# Patient Record
Sex: Male | Born: 1940 | Race: White | Hispanic: No | Smoking: Never smoker
Health system: Southern US, Community
[De-identification: ages and names within clinical notes are randomized; demographics above are authoritative.]

## PROBLEM LIST (undated history)

## (undated) DIAGNOSIS — F32A Depression, unspecified: Secondary | ICD-10-CM

## (undated) DIAGNOSIS — R42 Dizziness and giddiness: Secondary | ICD-10-CM

## (undated) DIAGNOSIS — N183 Chronic kidney disease, stage 3 unspecified: Secondary | ICD-10-CM

## (undated) DIAGNOSIS — G4730 Sleep apnea, unspecified: Secondary | ICD-10-CM

## (undated) DIAGNOSIS — H9190 Unspecified hearing loss, unspecified ear: Secondary | ICD-10-CM

## (undated) DIAGNOSIS — F329 Major depressive disorder, single episode, unspecified: Secondary | ICD-10-CM

## (undated) DIAGNOSIS — M109 Gout, unspecified: Secondary | ICD-10-CM

## (undated) DIAGNOSIS — I10 Essential (primary) hypertension: Secondary | ICD-10-CM

## (undated) DIAGNOSIS — F0390 Unspecified dementia without behavioral disturbance: Secondary | ICD-10-CM

## (undated) HISTORY — PX: COLONOSCOPY: SHX174

---

## 2013-06-03 ENCOUNTER — Inpatient Hospital Stay: Payer: Self-pay | Admitting: Internal Medicine

## 2013-06-03 LAB — CBC
HCT: 42.3 % (ref 40.0–52.0)
HGB: 14.5 g/dL (ref 13.0–18.0)
MCH: 29.4 pg (ref 26.0–34.0)
MCHC: 34.3 g/dL (ref 32.0–36.0)
MCV: 86 fL (ref 80–100)
Platelet: 178 10*3/uL (ref 150–440)
RBC: 4.94 10*6/uL (ref 4.40–5.90)

## 2013-06-03 LAB — COMPREHENSIVE METABOLIC PANEL
Albumin: 3.5 g/dL (ref 3.4–5.0)
Alkaline Phosphatase: 86 U/L
Anion Gap: 6 — ABNORMAL LOW (ref 7–16)
Bilirubin,Total: 1.2 mg/dL — ABNORMAL HIGH (ref 0.2–1.0)
Chloride: 109 mmol/L — ABNORMAL HIGH (ref 98–107)
Creatinine: 1.78 mg/dL — ABNORMAL HIGH (ref 0.60–1.30)
EGFR (African American): 43 — ABNORMAL LOW
Glucose: 159 mg/dL — ABNORMAL HIGH (ref 65–99)
Osmolality: 283 (ref 275–301)
Potassium: 3.6 mmol/L (ref 3.5–5.1)
Sodium: 139 mmol/L (ref 136–145)
Total Protein: 7.3 g/dL (ref 6.4–8.2)

## 2013-06-03 LAB — TROPONIN I
Troponin-I: 0.12 ng/mL — ABNORMAL HIGH
Troponin-I: 0.1 ng/mL — ABNORMAL HIGH

## 2013-06-03 LAB — CK TOTAL AND CKMB (NOT AT ARMC)
CK, Total: 78 U/L (ref 35–232)
CK, Total: 71 U/L (ref 35–232)
CK, Total: 62 U/L (ref 35–232)
CK-MB: 0.7 ng/mL (ref 0.5–3.6)
CK-MB: 0.8 ng/mL (ref 0.5–3.6)

## 2013-06-04 LAB — BASIC METABOLIC PANEL
Anion Gap: 5 — ABNORMAL LOW (ref 7–16)
Chloride: 110 mmol/L — ABNORMAL HIGH (ref 98–107)
Creatinine: 1.42 mg/dL — ABNORMAL HIGH (ref 0.60–1.30)
EGFR (African American): 57 — ABNORMAL LOW
Osmolality: 285 (ref 275–301)
Sodium: 141 mmol/L (ref 136–145)

## 2013-06-04 LAB — LIPID PANEL
Cholesterol: 134 mg/dL (ref 0–200)
Ldl Cholesterol, Calc: 80 mg/dL (ref 0–100)
VLDL Cholesterol, Calc: 29 mg/dL (ref 5–40)

## 2013-06-05 LAB — BASIC METABOLIC PANEL
BUN: 20 mg/dL — ABNORMAL HIGH (ref 7–18)
Calcium, Total: 8.9 mg/dL (ref 8.5–10.1)
Chloride: 106 mmol/L (ref 98–107)
Co2: 28 mmol/L (ref 21–32)
EGFR (African American): >60
EGFR (Non-African Amer.): 59 — ABNORMAL LOW
Glucose: 109 mg/dL — ABNORMAL HIGH (ref 65–99)
Osmolality: 281 (ref 275–301)
Sodium: 139 mmol/L (ref 136–145)

## 2013-06-05 LAB — HEMOGLOBIN: HGB: 14.4 g/dL (ref 13.0–18.0)

## 2014-10-12 NOTE — H&P (Signed)
PATIENT NAME:  Stanley Nielsen, Stanley Nielsen MR#:  [id number] DATE OF BIRTH:  01-23-1941  DATE OF ADMISSION:  06/03/2013  PRIMARY CARE PHYSICIAN: [facility]   CHIEF COMPLAINT: Syncope.   HISTORY OF PRESENT ILLNESS: This is a 74 year old male with a history of diabetes, who presents with the above complaint. 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 He picked up a heavy load, walked about 10 steps and all of a sudden felt very nauseous, lightheaded. Sat down the steps, and apparently he passed out. EMS had been called and when EMS arrived, he came to. There is no report of his blood sugars at that time. He woke up this morning actually feeling diaphoretic and had some nausea this morning as well. Three days ago, he says that he has acute left-sided sternal chest pain. It came and went over 24 hours, lasting about 20 to 30 minutes. No other associated symptoms. He has not reported chest pain since then.   REVIEW OF SYSTEMS:   CONSTITUTIONAL: No fever. Positive fatigue, weakness. No weight loss, weight gain.  EYES: No blurred or double vision, glaucoma or cataracts. EARS, NOSE, THROAT: No ear pain, hearing loss, seasonal allergies, postnasal drip. RESPIRATORY: No cough, wheezing, hemoptysis, dyspnea, asthma, painful respirations.  CARDIOVASCULAR: Chest pain 3 days ago, as mentioned above. No palpitations or orthopnea. Positive syncope today. No arrhythmia, dyspnea on exertion. GASTROINTESTINAL: Positive nausea and vomiting. No diarrhea, abdominal pain, melena, or ulcers.  GENITOURINARY: No dysuria or hematuria. ENDOCRINE: No polyuria or polydipsia. HEMATOLOGIC AND LYMPHATIC: No anemia or easy bruising. SKIN: No rash or lesions.  MUSCULOSKELETAL: No pain in the shoulders or knees.  NEUROLOGIC: No history of CVA, TIA, or seizures.  PSYCHIATRIC: No history of anxiety or depression.    PAST MEDICAL HISTORY:  1.  Hypertension. 2.  Diabetes.   MEDICATIONS:  1.  Metformin 500 b.i.d.   2.  Metoprolol 25 b.i.d.  3.  Lisinopril 10 mg daily.  4.  HCTZ 50 mg daily.   ALLERGIES: PENICILLIN CAUSE HIVES.   SOCIAL HISTORY: No tobacco, alcohol, or drug use.   FAMILY HISTORY: Positive for cancer and atrial fibrillation. Mom is still living at age 74.   PAST SURGICAL HISTORY: None.   PHYSICAL EXAMINATION: VITAL SIGNS: Temperature is 97.4, pulse 65, respirations 18, blood pressure 131/59, 96% on room air.  GENERAL: The patient is alert, oriented, not in acute distress. HEENT: Head is atraumatic. Pupils are round, reactive. Sclerae anicteric. Mucous membranes are moist. Oropharynx is clear.  NECK: Very short. Hard to feel any enlarged thyroid or lymphadenopathy.  CARDIOVASCULAR: Regular rate and rhythm. No murmurs, gallops, or rubs. Hard to appreciate PMI due to body habitus.  ABDOMEN: Obese. Bowel sounds are positive. Nontender, nondistended. Hard to appreciate organomegaly due to body habitus. No ecchymosis.  BACK: No CVA or vertebral tenderness.  LUNGS: Clear to auscultation without crackles, rales, rhonchi, or wheezing. Symmetrical rise and fall of the chest. Normal symmetrical expansion.  SKIN: Within normal limits. Well-hydrated. No diaphoresis.  NEUROLOGIC: Cranial nerves II through XII are intact. There are no focal deficits. Motor strength is 4 out of 4 bilateral upper and lower extremities.  MUSCULOSKELETAL: No pathology to digits or nails. Extremities move x 4. No tenderness or effusion. Range of motion is adequate.   LABORATORY, DIAGNOSTIC AND RADIOLOGICAL DATA: Troponin 0.11. CPK-MB is 0.7, CK 78. Total protein 7.3, albumin 3.5, bilirubin 1.2, alkaline phosphatase 86, AST 29, ALT 33. Sodium 139, potassium 3.6, chloride 109,  bicarbonate 24, BUN 18, creatinine 1.78. Glucose is 159. White blood cells 6.9, hemoglobin 14.5, hematocrit 43, platelets 178. Chest x-ray showed no acute cardiopulmonary disease. EKG: Sinus bradycardia. There are some nonspecific T changes.    ASSESSMENT AND PLAN: This is a 74 year old male who presented with syncope and found to have an elevated troponin.  1.  Syncope. This could be secondary to a non-ST elevation myocardial infarction versus a vasovagal reaction from carrying a heavy load versus possible hypoglycemia, as the patient does report some blood sugars that have been high and low recently. The patient will be admitted to telemetry. We will continue to cycle his cardiac enzymes. Have ordered an echocardiogram. Will also check orthostatic vital signs and monitor him closely.  2.  Elevated troponin with possible non-ST elevation myocardial infarction. The patient had chest pain 3 days ago. He is a diabetic. He had a syncopal episode today. Concerned about the possibility of non-ST elevation myocardial infarction. His first troponin is 0.11. We will continue to cycle these. I have ordered full-dose Lovenox. Side effects, alternatives, and risks were reviewed with the patient, who does want to start Lovenox full-dose. Will continue aspirin, beta blocker. Check fasting lipids. Cardiology consultation has been ordered as well.  3.  Diabetes. Will continue ADA diet and sliding scale insulin. Hold metformin, basically due to his renal insufficiency as well as possible need for cardiac catheterization if troponins continue to climb.  4.  Acute kidney injury. I am not sure what his baseline is. It is 1.78. I will continue lisinopril, hold the HCTZ, continue metoprolol, and monitor closely his creatinine and provide some very gentle hydration.  5.  Hypertension. Will continue lisinopril, metoprolol. Hold HCTZ for now due to acute kidney injury.  6.  The patient is a full code status.   TIME SPENT: 45 minutes.   ____________________________ Janyth Contes. Juliene Pina, MD spm:jcm D: 06/03/2013 15:21:54 ET T: 06/03/2013 16:25:34 ET JOB#: [id number]  cc: Robb Sibal P. Juliene Pina, MD, <Dictator> Dr. Azucena Kuba, [facility] Internal Medicine Travious Vanover P Niranjan Rufener MD ELECTRONICALLY SIGNED  06/04/2013 14:57

## 2014-10-12 NOTE — Consult Note (Signed)
PATIENT NAME:  Stanley Nielsen, Stanley Nielsen MR#:  [id number] DATE OF BIRTH:  [birth year]  DATE OF CONSULTATION:  06/04/2013  CARDIOLOGY CONSULTATION  PRIMARY CARE PHYSICIAN:  Dr. Azucena Kuba, [facility] CONSULTING PHYSICIAN:  Marcina Millard, MD  CHIEF COMPLAINT: "I passed out."  REASON FOR CONSULTATION: Consultation requested for evaluation of syncope and elevated troponin.   HISTORY OF PRESENT ILLNESS: The patient is a 74 year old gentleman referred for evaluation of syncope. The patient reports he had 2 previous syncopal episodes. The last episode occurred after cardiac catheterization was performed 18 months ago at [facility]. The patient reports that he became nauseous, lightheaded and passed out.  Cardiac catheterization apparently revealed normal coronary anatomy, normal left ventricular function. The patient reports that he was in his usual state of health until yesterday when he was unloading a truck, became nauseous, lightheaded, sat down the steps and lost consciousness briefly. EMS was called. He was brought to [facility] Emergency Room where EKG revealed normal sinus rhythm with nonspecific ST abnormalities laterally. Admission labs were notable for borderline elevated troponin of 0.11, follow-up troponin was 0.12. The patient has been asymptomatic following admission. EKG revealed sinus rhythm without brady- or tachyarrhythmias.   PAST MEDICAL HISTORY: 1.  Hypertension.  2.  Diabetes. 3.  Apparent vasovagal syncope.   MEDICATIONS: Metoprolol 25 mg b.i.d., lisinopril 10 mg daily, HCTZ 50 mg daily, metformin 500 mg b.i.d.   SOCIAL HISTORY: The patient denies tobacco or EtOH abuse. The patient currently lives alone.   FAMILY HISTORY: Noncontributory.  REVIEW OF SYSTEMS:  CONSTITUTIONAL: No fever or chills.  EYES: No blurry vision.  EARS: No hearing loss.  RESPIRATORY: No shortness of breath.  CARDIOVASCULAR: The patient has had one brief episode of chest pain earlier in the week. Currently  denies chest pain.  GASTROINTESTINAL: The patient had nausea prior to syncopal episode, denies constipation, diarrhea.  GENITOURINARY: No dysuria or hematuria.  ENDOCRINE: No polyuria or polydipsia.  MUSCULOSKELETAL: No arthralgias or myalgias.  NEUROLOGICAL: No focal muscle weakness or numbness.  PSYCHOLOGICAL: No depression or anxiety.   PHYSICAL EXAMINATION: VITAL SIGNS: Blood pressure 134/75, pulse 64, respirations 19, temperature 97.9, pulse oximetry 97%.  HEENT: Pupils equal and reactive to light and accommodation.  NECK: Supple without thyromegaly.  LUNGS: Clear.  CARDIOVASCULAR: Normal JVP. Normal PMI. Regular rate and rhythm. Normal S1, S2. No appreciable gallop, murmur or rub.  ABDOMEN: Soft and nontender. Pulses were intact bilaterally.  MUSCULOSKELETAL: Normal muscle tone.  NEUROLOGIC: The patient is alert and oriented x 3. Motor and sensory are both grossly intact.   IMPRESSION: A 74 year old gentleman with apparent normal coronary anatomy by recent cardiac catheterization with history of vasovagal syncope who presents with syncope which sounds vasovagal in nature. The patient has borderline elevated troponin without peak or trough in the absence of chest pain or ECG changes. It is unlikely the patient has acute coronary syndrome based on recent history of normal cardiac catheterization. The patient has had a prior history of vasovagal syncope.   RECOMMENDATIONS: 1.  Continue present medications.  2.  Defer full dose anticoagulation.  3.  Review 2-D echocardiogram.  4.  In light of history of normal coronary anatomy by recent cardiac catheterization, would defer further noninvasive or invasive evaluation at this time. 5.  Ambulate patient today. If patient does well, may consider discharge later today or in a.m.   ____________________________ Marcina Millard, MD ap:ce D: 06/04/2013 09:38:41 ET T: 06/04/2013 13:48:19 ET JOB#: [id number]  cc: Marcina Millard, MD,  <Dictator> Lyn Hollingshead  Zhania Shaheen MD ELECTRONICALLY SIGNED 06/09/2013 8:52

## 2014-10-12 NOTE — Discharge Summary (Signed)
PATIENT NAME:  Stanley Nielsen, Stanley Nielsen MR#:  [id number] DATE OF BIRTH:  24-Sep-1940  DATE OF ADMISSION:  06/03/2013 DATE OF DISCHARGE:  06/05/2013   ADMITTING DIAGNOSIS: Syncope.   DISCHARGE DIAGNOSES:   1. Syncope, likely vasovagal; however, cannot rule out due to dehydration, as well as bradycardia.  2.  Bradycardia, likely due to beta blocker.  3.  Dehydration, resolved with intravenous fluid administration.  4.  Recent chest pain.  5.  Minimally elevated troponin. No acute coronary syndrome, according to cardiologist.  6.  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  7.  Acute renal failure, resolved on intravenous fluids. 8.  History of hypertension, diabetes mellitus with hemoglobin A1c of 6.9.   DISCHARGE CONDITION: Stable.   DISCHARGE MEDICATIONS:    1.  Aspirin 81 mg p.o. daily.  2.  Metoprolol 25 mg 1/2 tablet twice daily. This is a new dose.  3.  Lisinopril 10 mg p.o. twice daily. This is a new dose.  4.  Metformin 500 mg p.o. twice daily.  5.  The patient was advised not to take hydrochlorothiazide unless recommended by primary care physician.   HOME OXYGEN: None.   DIET: Two-gram salt, low-fat, low-cholesterol, carbohydrate-controlled diet, regular consistency.   ACTIVITY LIMITATIONS: As tolerated.     FOLLOWUP APPOINTMENTS:  [facility], [place] Ms. Reed in 1 day after discharge.    CONSULTANTS: Cardiology, Dr. Darrold Junker.   RADIOLOGIC STUDIES: Chest x-ray, portable single view, 06/03/2013, showed no active disease. Echocardiogram, done 06/04/2013, revealed a left ventricular ejection fraction by visual estimation of 40% to 45%. Mildly decreased global left ventricular systolic function,  segment apical septal segment, apical lateral segment and apex were abnormal. Mild left ventricular hypertrophy, mitral valve regurgitation, mild  tricuspid regurgitation were noted.   The patient is a 74 year old Caucasian male with past medical history significant for history of hypertension and diabetes, who presented to the hospital with complaints of syncopal episode. Apparently, the patient suddenly became very nauseous, lightheaded and passed out. He was also somewhat diaphoretic in the morning of admission. Three days ago prior to coming to the hospital, he was having problems with chest pains, lasting approximately 20 to 30 minutes. On arrival to the Emergency Room.   The patient's vital signs, temperature was 97.4, pulse was 65, respiratory rate was 18, blood pressure 151/59, saturation was 96% on room air.  Physical exam was unremarkable.   The patient's EKG showed sinus brady at rate of 57 beats per minute, normal axis, nonspecific ST-T changes. Consider lateral ischemia. No previous EKG was available. The patient's chest x-ray was unremarkable.   The patient's lab data, BMP showed elevation of creatinine to 1.78, glucose 159, otherwise BMP was unremarkable. The patient's liver enzymes revealed a total bilirubin of 1.2, otherwise unremarkable. The patient's troponin was slightly elevated at 0.11; otherwise, BMP was normal. CBC was within normal limits.   The patient was admitted to the hospital for further evaluation. His cardiac enzymes were cycled and they showed no peak. Second troponin was 0.12, and the third one was 0.1. Cardiology consultation was obtained because his elevated troponin, as well as his syncopal episode. Cardiologist, however, did not feel the patient needs to have any further evaluation, since the patient reported significant workup including cardiac catheterization, as well as echocardiogram approximately 1-1/2 years ago. Echocardiogram was performed, and it was found to be slightly abnormal. We  were planning Myoview stress test for the patient; however, the patient initially refused. Later on, he felt that you  could have those studies done as an outpatient in the end. For this reason, we are discharging this patient from the hospital with [facility]  Cardiologist also felt that the patient's syncope could have been vasovagal; however, since the patient was bradycardic, there was concern that bradycardia, as well as some dehydration re-presenting itself with elevated creatinine level were to blame as well. The patient with rehydrated, and his kidney function normalized by the day of discharge, 06/05/2013. It was felt that the patient's dehydration could have been related to his diabetes, exacerbated by hydrochlorothiazide. His kidney function  was also abnormal because of dehydration, as well as ACE inhibitor and metformin, as well as hydrochlorothiazide.  For this reason, hydrochlorothiazide was placed on hold as is standard. The patient is to return back to his primary physician and make decisions about reinstitution of this medication if needed. Meanwhile, his beta blocker was cut down due to bradycardia, and his blood pressure medication lisinopril was advanced when his kidney function normalized to a twice daily dose.   In regards to diabetes mellitus. The patient's hemoglobin A1c was found to be 6.9. It was felt that his diabetes was relatively satisfactorily controlled. The patient is to continue metformin, since his kidney function normalized.   In regards to his mild cardiomyopathy with ejection fraction of 40% to 45% with wall motion abnormalities, it was felt that the patient would benefit from further cardiologic evaluation, especially if these changes are new since his prior studies approximately 1-1/2 years ago.   The patient is being discharged in stable condition with the above-mentioned medications and followup.   His vital signs on day of discharge, temperature was 97.6, pulse was 65 to 75, respiration rate was 17 to 18, blood pressure ranging high at around 150s to 160s  systolic. O2 sats were 96% on room air at rest.   TIME SPENT: 40 minutes on this patient.   ____________________________ Katharina Caperima Debe Anfinson, MD rv:dmm D: 06/05/2013 20:03:00 ET T: 06/05/2013 21:22:11 ET JOB#: [id number]  cc: Katharina Caperima Dorleen Kissel, MD, <Dictator> Ms. Renato Gailseed, [facility] Taggert Bozzi Winona Legato[place] MD ELECTRONICALLY SIGNED 06/17/2013 19:11

## 2015-02-14 ENCOUNTER — Emergency Department
Admission: EM | Admit: 2015-02-14 | Discharge: 2015-02-14 | Disposition: A | Discharge status: Home / Self Care | Payer: Non-veteran care | Attending: Emergency Medicine | Admitting: Emergency Medicine

## 2015-02-14 ENCOUNTER — Other Ambulatory Visit: Payer: Self-pay

## 2015-02-14 DIAGNOSIS — E860 Dehydration: Secondary | ICD-10-CM | POA: Insufficient documentation

## 2015-02-14 DIAGNOSIS — Z880 Allergy status to penicillin: Secondary | ICD-10-CM | POA: Insufficient documentation

## 2015-02-14 DIAGNOSIS — R55 Syncope and collapse: Secondary | ICD-10-CM | POA: Insufficient documentation

## 2015-02-14 LAB — BASIC METABOLIC PANEL
Anion gap: 10 (ref 5–15)
BUN: 26 mg/dL — AB (ref 6–20)
CALCIUM: 9.2 mg/dL (ref 8.9–10.3)
CO2: 22 mmol/L (ref 22–32)
CREATININE: 1.67 mg/dL — AB (ref 0.61–1.24)
Chloride: 110 mmol/L (ref 101–111)
GFR calc non Af Amer: 39 mL/min — ABNORMAL LOW (ref >60)
GFR, EST AFRICAN AMERICAN: 45 mL/min — AB (ref >60)
Glucose, Bld: 181 mg/dL — ABNORMAL HIGH (ref 65–99)
Potassium: 3.2 mmol/L — ABNORMAL LOW (ref 3.5–5.1)
SODIUM: 142 mmol/L (ref 135–145)

## 2015-02-14 LAB — CBC
HCT: 44.4 % (ref 40.0–52.0)
Hemoglobin: 15.3 g/dL (ref 13.0–18.0)
MCH: 29.8 pg (ref 26.0–34.0)
MCHC: 34.4 g/dL (ref 32.0–36.0)
MCV: 86.5 fL (ref 80.0–100.0)
PLATELETS: 189 10*3/uL (ref 150–440)
RBC: 5.14 MIL/uL (ref 4.40–5.90)
RDW: 13.3 % (ref 11.5–14.5)
WBC: 7.3 10*3/uL (ref 3.8–10.6)

## 2015-02-14 MED ORDER — SODIUM CHLORIDE 0.9 % IV BOLUS (SEPSIS)
1000.0000 mL | Freq: Once | INTRAVENOUS | Status: AC
  Administered 2015-02-14: 1000 mL via INTRAVENOUS

## 2015-02-14 NOTE — ED Provider Notes (Signed)
[facility] Emergency Department Provider Note  ____________________________________________  Time seen: 12:20 PM  I have reviewed the triage vital signs and the nursing notes.   HISTORY  Chief Complaint Loss of Consciousness    HPI Stanley Nielsen is a 74 y.o. male who presents to the ED after a syncopal episode this morning. Patient and family at the bedside note that they were at a funeral this morning and had been standing still outside in the sun for approximately 45 minutes when he felt lightheaded and then passed out. He denies any preceding symptoms such as chest pain shortness of breath headache vision change back pain abdominal pain. No vomiting diarrhea. No recent illness. He notes that he had not eaten or drank anything this morning prior to the funeral. He currently feels better and is asymptomatic.  He has had one episode of this in the past again when he had not eaten or drank anything for her entire day and then had gone to the [place] for a clinic appointment.     No past medical history on file. Denies There are no active problems to display for this patient.   No past surgical history on file.  No current outpatient prescriptions on file. None Allergies Penicillins  No family history on file.  Social History Social History  Substance Use Topics  . Smoking status: Not on file  . Smokeless tobacco: Not on file  . Alcohol Use: Not on file   no tobacco alcohol or drug use  Review of Systems  Constitutional: No fever or chills. No weight changes Eyes:No blurry vision or double vision.  ENT: No sore throat. Cardiovascular: No chest pain. Respiratory: No dyspnea or cough. Gastrointestinal: Negative for abdominal pain, vomiting and diarrhea.  No BRBPR or melena. Genitourinary: Negative for dysuria, urinary retention, bloody urine, or difficulty urinating. Musculoskeletal: Negative for back pain. No joint swelling or pain. Skin:  Negative for rash. Neurological: Negative for headaches, focal weakness or numbness. Psychiatric:No anxiety or depression.   Endocrine:No hot/cold intolerance, changes in energy, or sleep difficulty.  10-point ROS otherwise negative.  ____________________________________________   PHYSICAL EXAM:  VITAL SIGNS: ED Triage Vitals  Enc Vitals Group     BP 02/14/15 1200 123/81 mmHg     Pulse Rate 02/14/15 1213 69     Resp 02/14/15 1200 17     Temp 02/14/15 1213 98.3 F (36.8 C)     Temp Source 02/14/15 1213 Oral     SpO2 02/14/15 1149 96 %     Weight 02/14/15 1213 210 lb (95.255 kg)     Height 02/14/15 1213  (1.727 m)     Head Cir --      Peak Flow --      Pain Score --      Pain Loc --      Pain Edu? --      Excl. in GC? --      Constitutional: Alert and oriented. Well appearing and in no distress. Eyes: No scleral icterus. No conjunctival pallor. PERRL. EOMI ENT   Head: Normocephalic and atraumatic.   Nose: No congestion/rhinnorhea. No septal hematoma   Mouth/Throat: MMM, no pharyngeal erythema. No peritonsillar mass. No uvula shift.   Neck: No stridor. No SubQ emphysema. No meningismus. Hematological/Lymphatic/Immunilogical: No cervical lymphadenopathy. Cardiovascular: RRR. Normal and symmetric distal pulses are present in all extremities. No murmurs, rubs, or gallops. Respiratory: Normal respiratory effort without tachypnea nor retractions. Breath sounds are clear and equal bilaterally. No  wheezes/rales/rhonchi. Gastrointestinal: Soft and nontender. No distention. There is no CVA tenderness.  No rebound, rigidity, or guarding. Genitourinary: deferred Musculoskeletal: Nontender with normal range of motion in all extremities. No joint effusions.  No lower extremity tenderness.  No edema. Neurologic:   Normal speech and language.  CN 2-10 normal. Motor grossly intact. No pronator drift.  Normal gait. No gross focal neurologic deficits are appreciated.   Skin:  Skin is warm, dry and intact. No rash noted.  No petechiae, purpura, or bullae. Psychiatric: Mood and affect are normal. Speech and behavior are normal. Patient exhibits appropriate insight and judgment.  ____________________________________________    LABS (pertinent positives/negatives) (all labs ordered are listed, but only abnormal results are displayed) Labs Reviewed  BASIC METABOLIC PANEL - Abnormal; Notable for the following:    Potassium 3.2 (*)    Glucose, Bld 181 (*)    BUN 26 (*)    Creatinine, Ser 1.67 (*)    GFR calc non Af Amer 39 (*)    GFR calc Af Amer 45 (*)    All other components within normal limits  CBC   ____________________________________________   EKG  Interpreted by me Normal sinus rhythm rate of 77, normal axis, normal intervals, normal QRS and ST segments. There are T wave inversions of 1 and aVL which are unchanged compared to prior EKG on 06/03/2013.  ____________________________________________    RADIOLOGY    ____________________________________________   PROCEDURES  ____________________________________________   INITIAL IMPRESSION / ASSESSMENT AND PLAN / ED COURSE  Pertinent labs & imaging results that were available during my care of the patient were reviewed by me and considered in my medical decision making (see chart for details).  Patient presents with syncope most likely due to vasovagal episode and dehydration related to poor oral intake today. He is very well-appearing, comfortable no acute distress at this time and back to baseline. I'll give him IV fluids while waiting for labs and plan to discharge home and less there any severe findings.  ----------------------------------------- 1:49 PM on 02/14/2015 -----------------------------------------  Labs overall unremarkable. Creatinine slightly elevated which is not unusual for this patient looking at prior records. This is consistent with dehydration which should  be improved now with IV fluids. We'll discharge him home and have him follow up with primary care  ____________________________________________   FINAL CLINICAL IMPRESSION(S) / ED DIAGNOSES  Final diagnoses:  Syncope, unspecified syncope type  Mild dehydration      Sharman Cheek, MD 02/14/15 1350

## 2015-02-14 NOTE — Discharge Instructions (Signed)
Dehydration Dehydration means your body does not have as much fluid as it needs. Your kidneys, brain, and heart will not work properly without the right amount of fluids and salt. Older adults are more likely to become dehydrated than younger adults. This is because:   Their bodies do not hold water as well.  Their bodies do not respond to temperature changes as well.  They do not get thirsty as easily or as quickly. HOME CARE  Ask your doctor how to replace body fluid losses (rehydrate).  Drink enough fluids to keep your pee (urine) clear or pale yellow.  Drink small amounts of fluids often if you feel sick to your stomach (nauseous) or throw up (vomit).  Eat like you normally do.  Avoid:  Foods or drinks high in sugar.  Bubbly (carbonated) drinks.  Juice.  Very hot or cold fluids.  Drinks with caffeine.  Fatty, greasy foods.  Alcohol.  Tobacco.  Eating too much.  Gelatin desserts.  Wash your hands to avoid spreading germs (bacteria, viruses).  Only take medicine as told by your doctor.  Keep all doctor visits as told. GET HELP IF:  You have belly (abdominal) pain that gets worse or stays in one spot (localizes).  You have a rash, stiff neck, or bad headache.  You get easily annoyed, sleepy, or are hard to wake up.  You feel weak, dizzy, or very thirsty.  You have a fever. GET HELP RIGHT AWAY IF:   You cannot drink fluid without throwing up.  You get worse even with treatment.  You throw up often.  You have watery poop (diarrhea) often.  Your vomit has blood in it or looks greenish.  Your poop (stool) has blood in it or looks black and tarry.  You have not peed in 6 to 8 hours or have only peed a small amount of very dark pee.  You pass out (faint). MAKE SURE YOU:   Understand these instructions.  Will watch your condition.  Will get help right away if you are not doing well or get worse. Document Released: 05/28/2011 Document Revised:  06/13/2013 Document Reviewed: 02/13/2013 [facility] Patient Information 2015 [place], [place]. This information is not intended to replace advice given to you by your health care provider. Make sure you discuss any questions you have with your health care provider.  Syncope Syncope is a medical term for fainting or passing out. This means you lose consciousness and drop to the ground. People are generally unconscious for less than 5 minutes. You may have some muscle twitches for up to 15 seconds before waking up and returning to normal. Syncope occurs more often in older adults, but it can happen to anyone. While most causes of syncope are not dangerous, syncope can be a sign of a serious medical problem. It is important to seek medical care.  CAUSES  Syncope is caused by a sudden drop in blood flow to the brain. The specific cause is often not determined. Factors that can bring on syncope include:  Taking medicines that lower blood pressure.  Sudden changes in posture, such as standing up quickly.  Taking more medicine than prescribed.  Standing in one place for too long.  Seizure disorders.  Dehydration and excessive exposure to heat.  Low blood sugar (hypoglycemia).  Straining to have a bowel movement.  Heart disease, irregular heartbeat, or other circulatory problems.  Fear, emotional distress, seeing blood, or severe pain. SYMPTOMS  Right before fainting, you may:  Feel dizzy  or light-headed.  Feel nauseous.  See all white or all black in your field of vision.  Have cold, clammy skin. DIAGNOSIS  Your health care provider will ask about your symptoms, perform a physical exam, and perform an electrocardiogram (ECG) to record the electrical activity of your heart. Your health care provider may also perform other heart or blood tests to determine the cause of your syncope which may include:  Transthoracic echocardiogram (TTE). During echocardiography, sound waves are used to  evaluate how blood flows through your heart.  Transesophageal echocardiogram (TEE).  Cardiac monitoring. This allows your health care provider to monitor your heart rate and rhythm in real time.  Holter monitor. This is a portable device that records your heartbeat and can help diagnose heart arrhythmias. It allows your health care provider to track your heart activity for several days, if needed.  Stress tests by exercise or by giving medicine that makes the heart beat faster. TREATMENT  In most cases, no treatment is needed. Depending on the cause of your syncope, your health care provider may recommend changing or stopping some of your medicines. HOME CARE INSTRUCTIONS  Have someone stay with you until you feel stable.  Do not drive, use machinery, or play sports until your health care provider says it is okay.  Keep all follow-up appointments as directed by your health care provider.  Lie down right away if you start feeling like you might faint. Breathe deeply and steadily. Wait until all the symptoms have passed.  Drink enough fluids to keep your urine clear or pale yellow.  If you are taking blood pressure or heart medicine, get up slowly and take several minutes to sit and then stand. This can reduce dizziness. SEEK IMMEDIATE MEDICAL CARE IF:   You have a severe headache.  You have unusual pain in the chest, abdomen, or back.  You are bleeding from your mouth or rectum, or you have black or tarry stool.  You have an irregular or very fast heartbeat.  You have pain with breathing.  You have repeated fainting or seizure-like jerking during an episode.  You faint when sitting or lying down.  You have confusion.  You have trouble walking.  You have severe weakness.  You have vision problems. If you fainted, call your local emergency services (911 in [place] Do not drive yourself to the hospital.  MAKE SURE YOU:  Understand these instructions.  Will watch your  condition.  Will get help right away if you are not doing well or get worse. Document Released: 06/08/2005 Document Revised: 06/13/2013 Document Reviewed: 08/07/2011 [facility] Patient Information 2015 [place], [place]. This information is not intended to replace advice given to you by your health care provider. Make sure you discuss any questions you have with your health care provider.

## 2015-02-14 NOTE — ED Notes (Addendum)
Pt was at a funeral this AM, had syncopal episode., no loss of bowel  Or bladder.. Pt sts had 'just not  feeling well in, prt  cbg =187

## 2015-04-23 ENCOUNTER — Encounter: Payer: Self-pay | Admitting: *Deleted

## 2015-04-30 ENCOUNTER — Ambulatory Visit: Payer: Non-veteran care | Admitting: Anesthesiology

## 2015-04-30 ENCOUNTER — Ambulatory Visit
Admission: RE | Admit: 2015-04-30 | Discharge: 2015-04-30 | Disposition: A | Discharge status: Home / Self Care | Payer: Non-veteran care | Source: Ambulatory Visit | Attending: Ophthalmology | Admitting: Ophthalmology

## 2015-04-30 ENCOUNTER — Encounter
Admission: RE | Disposition: A | Discharge status: Home / Self Care | Payer: Self-pay | Source: Ambulatory Visit | Attending: Ophthalmology

## 2015-04-30 ENCOUNTER — Encounter: Payer: Self-pay | Admitting: Anesthesiology

## 2015-04-30 DIAGNOSIS — I10 Essential (primary) hypertension: Secondary | ICD-10-CM | POA: Insufficient documentation

## 2015-04-30 DIAGNOSIS — M109 Gout, unspecified: Secondary | ICD-10-CM | POA: Diagnosis not present

## 2015-04-30 DIAGNOSIS — Z6833 Body mass index (BMI) 33.0-33.9, adult: Secondary | ICD-10-CM | POA: Diagnosis not present

## 2015-04-30 DIAGNOSIS — E119 Type 2 diabetes mellitus without complications: Secondary | ICD-10-CM | POA: Insufficient documentation

## 2015-04-30 DIAGNOSIS — E669 Obesity, unspecified: Secondary | ICD-10-CM | POA: Insufficient documentation

## 2015-04-30 DIAGNOSIS — Z880 Allergy status to penicillin: Secondary | ICD-10-CM | POA: Insufficient documentation

## 2015-04-30 DIAGNOSIS — F329 Major depressive disorder, single episode, unspecified: Secondary | ICD-10-CM | POA: Insufficient documentation

## 2015-04-30 DIAGNOSIS — H9190 Unspecified hearing loss, unspecified ear: Secondary | ICD-10-CM | POA: Diagnosis not present

## 2015-04-30 DIAGNOSIS — G4730 Sleep apnea, unspecified: Secondary | ICD-10-CM | POA: Diagnosis not present

## 2015-04-30 DIAGNOSIS — H2512 Age-related nuclear cataract, left eye: Secondary | ICD-10-CM | POA: Insufficient documentation

## 2015-04-30 HISTORY — DX: Depression, unspecified: F32.A

## 2015-04-30 HISTORY — DX: Dizziness and giddiness: R42

## 2015-04-30 HISTORY — DX: Gout, unspecified: M10.9

## 2015-04-30 HISTORY — DX: Unspecified hearing loss, unspecified ear: H91.90

## 2015-04-30 HISTORY — DX: Sleep apnea, unspecified: G47.30

## 2015-04-30 HISTORY — PX: CATARACT EXTRACTION W/PHACO: SHX586

## 2015-04-30 HISTORY — DX: Major depressive disorder, single episode, unspecified: F32.9

## 2015-04-30 HISTORY — DX: Essential (primary) hypertension: I10

## 2015-04-30 LAB — GLUCOSE, CAPILLARY: Glucose-Capillary: 176 mg/dL — ABNORMAL HIGH (ref 65–99)

## 2015-04-30 SURGERY — PHACOEMULSIFICATION, CATARACT, WITH IOL INSERTION
Anesthesia: Monitor Anesthesia Care | Site: Eye | Laterality: Left | Wound class: Clean

## 2015-04-30 MED ORDER — NA CHONDROIT SULF-NA HYALURON 40-17 MG/ML IO SOLN
INTRAOCULAR | Status: DC | PRN
  Administered 2015-04-30: 1 mL via INTRAOCULAR

## 2015-04-30 MED ORDER — EPINEPHRINE HCL 1 MG/ML IJ SOLN
INTRAMUSCULAR | Status: AC
  Filled 2015-04-30: qty 1

## 2015-04-30 MED ORDER — ARMC OPHTHALMIC DILATING GEL
1.0000 | OPHTHALMIC | Status: DC | PRN
Start: 2015-04-30 — End: 2015-04-30
  Administered 2015-04-30: 1 via OPHTHALMIC

## 2015-04-30 MED ORDER — TETRACAINE HCL 0.5 % OP SOLN
1.0000 [drp] | OPHTHALMIC | Status: DC | PRN
  Administered 2015-04-30: 1 [drp] via OPHTHALMIC

## 2015-04-30 MED ORDER — MOXIFLOXACIN HCL 0.5 % OP SOLN: 1.0000 [drp] | OPHTHALMIC | Status: DC | PRN

## 2015-04-30 MED ORDER — NA CHONDROIT SULF-NA HYALURON 40-17 MG/ML IO SOLN
INTRAOCULAR | Status: AC
  Filled 2015-04-30: qty 1

## 2015-04-30 MED ORDER — SODIUM CHLORIDE 0.9 % IV SOLN
INTRAVENOUS | Status: DC
Start: 2015-04-30 — End: 2015-04-30
  Administered 2015-04-30 (×2): via INTRAVENOUS

## 2015-04-30 MED ORDER — POVIDONE-IODINE 5 % OP SOLN
1.0000 "application " | OPHTHALMIC | Status: AC | PRN
  Administered 2015-04-30: 1 via OPHTHALMIC

## 2015-04-30 MED ORDER — MOXIFLOXACIN HCL 0.5 % OP SOLN
OPHTHALMIC | Status: DC | PRN
  Administered 2015-04-30: 1 [drp] via OPHTHALMIC

## 2015-04-30 MED ORDER — MOXIFLOXACIN HCL 0.5 % OP SOLN
OPHTHALMIC | Status: AC
  Filled 2015-04-30: qty 3

## 2015-04-30 MED ORDER — FENTANYL CITRATE (PF) 100 MCG/2ML IJ SOLN
INTRAMUSCULAR | Status: DC | PRN
  Administered 2015-04-30: 50 ug via INTRAVENOUS

## 2015-04-30 MED ORDER — ARMC OPHTHALMIC DILATING GEL
OPHTHALMIC | Status: AC
  Administered 2015-04-30: 1 via OPHTHALMIC
  Filled 2015-04-30: qty 0.25

## 2015-04-30 MED ORDER — CEFUROXIME OPHTHALMIC INJECTION 1 MG/0.1 ML
INJECTION | OPHTHALMIC | Status: AC
  Filled 2015-04-30: qty 0.1

## 2015-04-30 MED ORDER — POVIDONE-IODINE 5 % OP SOLN
OPHTHALMIC | Status: AC
  Administered 2015-04-30: 1 via OPHTHALMIC
  Filled 2015-04-30: qty 30

## 2015-04-30 MED ORDER — TETRACAINE HCL 0.5 % OP SOLN
OPHTHALMIC | Status: AC
  Administered 2015-04-30: 1 [drp] via OPHTHALMIC
  Filled 2015-04-30: qty 2

## 2015-04-30 MED ORDER — EPINEPHRINE HCL 1 MG/ML IJ SOLN
INTRAOCULAR | Status: DC | PRN
  Administered 2015-04-30: 1 mL via OPHTHALMIC

## 2015-04-30 MED ORDER — MIDAZOLAM HCL 2 MG/2ML IJ SOLN
INTRAMUSCULAR | Status: DC | PRN
  Administered 2015-04-30: 1 mg via INTRAVENOUS

## 2015-04-30 MED ORDER — CARBACHOL 0.01 % IO SOLN
INTRAOCULAR | Status: DC | PRN
  Administered 2015-04-30: 0.5 mL via INTRAOCULAR

## 2015-04-30 SURGICAL SUPPLY — 22 items
CANNULA ANT/CHMB 27GA (MISCELLANEOUS) ×3 IMPLANT
CUP MEDICINE 2OZ PLAST GRAD ST (MISCELLANEOUS) ×3 IMPLANT
GLOVE BIO SURGEON STRL SZ8 (GLOVE) ×3 IMPLANT
GLOVE BIOGEL M 6.5 STRL (GLOVE) ×3 IMPLANT
GLOVE SURG LX 8.0 MICRO (GLOVE) ×2
GLOVE SURG LX STRL 8.0 MICRO (GLOVE) ×1 IMPLANT
GOWN STRL REUS W/ TWL LRG LVL3 (GOWN DISPOSABLE) ×2 IMPLANT
GOWN STRL REUS W/TWL LRG LVL3 (GOWN DISPOSABLE) ×4
LENS IOL TECNIS 17.0 (Intraocular Lens) ×3 IMPLANT
LENS IOL TECNIS MONO 1P 17.0 (Intraocular Lens) ×1 IMPLANT
PACK CATARACT (MISCELLANEOUS) ×3 IMPLANT
PACK CATARACT BRASINGTON LX (MISCELLANEOUS) ×3 IMPLANT
PACK EYE AFTER SURG (MISCELLANEOUS) ×3 IMPLANT
SOL BSS BAG (MISCELLANEOUS) ×3
SOL PREP PVP 2OZ (MISCELLANEOUS) ×3
SOLUTION BSS BAG (MISCELLANEOUS) ×1 IMPLANT
SOLUTION PREP PVP 2OZ (MISCELLANEOUS) ×1 IMPLANT
SYR 3ML LL SCALE MARK (SYRINGE) ×3 IMPLANT
SYR 5ML LL (SYRINGE) ×3 IMPLANT
SYR TB 1ML 27GX1/2 LL (SYRINGE) ×3 IMPLANT
WATER STERILE IRR 1000ML POUR (IV SOLUTION) ×3 IMPLANT
WIPE NON LINTING 3.25X3.25 (MISCELLANEOUS) ×3 IMPLANT

## 2015-04-30 NOTE — H&P (Signed)
  All labs reviewed. Abnormal studies sent to patients PCP when indicated.  Previous H&P reviewed, patient examined, there are NO CHANGES.  Stanley Royse LOUIS11/8/20168:09 AM

## 2015-04-30 NOTE — Op Note (Signed)
PREOPERATIVE DIAGNOSIS:  Nuclear sclerotic cataract of the left eye.   POSTOPERATIVE DIAGNOSIS:  Nuclear sclerotic cataract of the left eye.   OPERATIVE PROCEDURE: Procedure(s): CATARACT EXTRACTION PHACO AND INTRAOCULAR LENS PLACEMENT (IOC)   SURGEON:  Galen ManilaWilliam Joplin Canty, MD.   ANESTHESIA:  Anesthesiologist: Berdine AddisonMathai Thomas, MD CRNA: Henrietta HooverKimberly Pope, CRNA  1.      Managed anesthesia care. 2.      Topical tetracaine drops followed by 2% Xylocaine jelly applied in the preoperative holding area.   COMPLICATIONS:  None.   TECHNIQUE:   Stop and chop   DESCRIPTION OF PROCEDURE:  The patient was examined and consented in the preoperative holding area where the aforementioned topical anesthesia was applied to the left eye and then brought back to the Operating Room where the left eye was prepped and draped in the usual sterile ophthalmic fashion and a lid speculum was placed. A paracentesis was created with the side port blade and the anterior chamber was filled with viscoelastic. A near clear corneal incision was performed with the steel keratome. A continuous curvilinear capsulorrhexis was performed with a cystotome followed by the capsulorrhexis forceps. Hydrodissection and hydrodelineation were carried out with BSS on a blunt cannula. The lens was removed in a stop and chop  technique and the remaining cortical material was removed with the irrigation-aspiration handpiece. The capsular bag was inflated with viscoelastic and the Technis ZCB00 lens was placed in the capsular bag without complication. The remaining viscoelastic was removed from the eye with the irrigation-aspiration handpiece. The wounds were hydrated. The anterior chamber was flushed with Miostat and the eye was inflated to physiologic pressure. 0.2 mL of Vigamox diluted three/one with BSS was placed in the anterior chamber. The wounds were found to be water tight. The eye was dressed with Vigamox. The patient was given protective glasses  to wear throughout the day and a shield with which to sleep tonight. The patient was also given drops with which to begin a drop regimen today and will follow-up with me in one day.  Implant Name Type Inv. Item Serial No. Manufacturer Lot No. LRB No. Used  LENS IMPL INTRAOC ZCB00 17.0 - [id number] Intraocular Lens LENS IMPL INTRAOC ZCB00 17.0 [id number] AMO   Left 1    Procedure(s) with comments: CATARACT EXTRACTION PHACO AND INTRAOCULAR LENS PLACEMENT (IOC) (Left) - US 00:47 AP% 23.5 CDE 11.16 fluid pack lot # [id number]  Electronically signed: Josilyn Shippee LOUIS 04/30/2015 8:38 AM

## 2015-04-30 NOTE — Anesthesia Postprocedure Evaluation (Signed)
  Anesthesia Post-op Note  Patient: Stanley Nielsen  Procedure(s) Performed: Procedure(s) with comments: CATARACT EXTRACTION PHACO AND INTRAOCULAR LENS PLACEMENT (IOC) (Left) - US 00:47 AP% 23.5 CDE 11.16 fluid pack lot # [id number]  Anesthesia type:MAC  Patient location:sds  Post pain: Pain level controlled  Post assessment: Post-op Vital signs reviewed, Patient's Cardiovascular Status Stable, Respiratory Function Stable, Patent Airway and No signs of Nausea or vomiting  Post vital signs: Reviewed and stable  Last Vitals:  Filed Vitals:   04/30/15 0736  BP: 173/101  Pulse: 83  Temp: 36.7 C  Resp: 16    Level of consciousness: awake, alert  and patient cooperative  Complications: No apparent anesthesia complications

## 2015-04-30 NOTE — Anesthesia Procedure Notes (Signed)
Procedure Name: MAC Date/Time: 04/30/2015 8:10 AM Performed by: Henrietta HooverPOPE, Adreana Coull Pre-anesthesia Checklist: Patient identified, Emergency Drugs available, Suction available, Patient being monitored and Timeout performed Oxygen Delivery Method: Nasal cannula Intubation Type: IV induction

## 2015-04-30 NOTE — Transfer of Care (Signed)
Immediate Anesthesia Transfer of Care Note  Patient: Stanley Nielsen  Procedure(s) Performed: Procedure(s) with comments: CATARACT EXTRACTION PHACO AND INTRAOCULAR LENS PLACEMENT (IOC) (Left) - US 00:47 AP% 23.5 CDE 11.16 fluid pack lot # [id number] H  Patient Location: PACU  Anesthesia Type:MAC  Level of Consciousness: awake  Airway & Oxygen Therapy: Patient Spontanous Breathing  Post-op Assessment: Report given to RN  Post vital signs: Reviewed and stable  Last Vitals:  Filed Vitals:   04/30/15 0736  BP: 173/101  Pulse: 83  Temp: 36.7 C  Resp: 16    Complications: No apparent anesthesia complications

## 2015-04-30 NOTE — Anesthesia Preprocedure Evaluation (Addendum)
Anesthesia Evaluation  Patient identified by MRN, date of birth, ID band Patient awake    Reviewed: Allergy & Precautions, NPO status , Patient's Chart, lab work & pertinent test results, reviewed documented beta blocker date and time   Airway Mallampati: II  TM Distance: >3 FB     Dental  (+) Chipped, Poor Dentition, Missing, Dental Advisory Given   Pulmonary sleep apnea and Continuous Positive Airway Pressure Ventilation ,           Cardiovascular hypertension, Pt. on medications      Neuro/Psych PSYCHIATRIC DISORDERS Depression    GI/Hepatic   Endo/Other  diabetes, Type 2  Renal/GU      Musculoskeletal   Abdominal   Peds  Hematology   Anesthesia Other Findings obese  Reproductive/Obstetrics                            Anesthesia Physical Anesthesia Plan  ASA: II  Anesthesia Plan: MAC   Post-op Pain Management:    Induction:   Airway Management Planned:   Additional Equipment:   Intra-op Plan:   Post-operative Plan:   Informed Consent: I have reviewed the patients History and Physical, chart, labs and discussed the procedure including the risks, benefits and alternatives for the proposed anesthesia with the patient or authorized representative who has indicated his/her understanding and acceptance.     Plan Discussed with: CRNA  Anesthesia Plan Comments:         Anesthesia Quick Evaluation

## 2015-05-13 ENCOUNTER — Encounter: Payer: Self-pay | Admitting: Ophthalmology

## 2015-05-13 NOTE — Addendum Note (Signed)
Addendum  created 05/13/15 1929 by Berdine AddisonMathai Jocabed Cheese, MD   Modules edited: Anesthesia Attestations, Anesthesia Events, Narrator   Anesthesia Attestations:  Attestation Deleted   Narrator:  Narrator: Event Log Edited

## 2015-06-14 ENCOUNTER — Emergency Department
Admission: EM | Admit: 2015-06-14 | Discharge: 2015-06-14 | Disposition: A | Discharge status: Home / Self Care | Payer: Non-veteran care | Attending: Emergency Medicine | Admitting: Emergency Medicine

## 2015-06-14 ENCOUNTER — Encounter: Payer: Self-pay | Admitting: Emergency Medicine

## 2015-06-14 DIAGNOSIS — Z792 Long term (current) use of antibiotics: Secondary | ICD-10-CM | POA: Insufficient documentation

## 2015-06-14 DIAGNOSIS — E119 Type 2 diabetes mellitus without complications: Secondary | ICD-10-CM | POA: Diagnosis not present

## 2015-06-14 DIAGNOSIS — R55 Syncope and collapse: Secondary | ICD-10-CM | POA: Diagnosis present

## 2015-06-14 DIAGNOSIS — Z880 Allergy status to penicillin: Secondary | ICD-10-CM | POA: Diagnosis not present

## 2015-06-14 DIAGNOSIS — Z79899 Other long term (current) drug therapy: Secondary | ICD-10-CM | POA: Insufficient documentation

## 2015-06-14 DIAGNOSIS — E860 Dehydration: Secondary | ICD-10-CM | POA: Diagnosis not present

## 2015-06-14 DIAGNOSIS — I10 Essential (primary) hypertension: Secondary | ICD-10-CM | POA: Insufficient documentation

## 2015-06-14 DIAGNOSIS — R112 Nausea with vomiting, unspecified: Secondary | ICD-10-CM | POA: Insufficient documentation

## 2015-06-14 HISTORY — DX: Unspecified dementia, unspecified severity, without behavioral disturbance, psychotic disturbance, mood disturbance, and anxiety: F03.90

## 2015-06-14 LAB — URINALYSIS COMPLETE WITH MICROSCOPIC (ARMC ONLY)
BILIRUBIN URINE: NEGATIVE
Bacteria, UA: NONE SEEN
GLUCOSE, UA: NEGATIVE mg/dL
Hgb urine dipstick: NEGATIVE
Leukocytes, UA: NEGATIVE
NITRITE: NEGATIVE
PH: 6 (ref 5.0–8.0)
Protein, ur: 30 mg/dL — AB
Specific Gravity, Urine: 1.011 (ref 1.005–1.030)
Squamous Epithelial / LPF: NONE SEEN

## 2015-06-14 LAB — BASIC METABOLIC PANEL
ANION GAP: 5 (ref 5–15)
BUN: 15 mg/dL (ref 6–20)
CALCIUM: 9 mg/dL (ref 8.9–10.3)
CO2: 27 mmol/L (ref 22–32)
CREATININE: 1.36 mg/dL — AB (ref 0.61–1.24)
Chloride: 109 mmol/L (ref 101–111)
GFR calc Af Amer: 58 mL/min — ABNORMAL LOW (ref >60)
GFR, EST NON AFRICAN AMERICAN: 50 mL/min — AB (ref >60)
GLUCOSE: 188 mg/dL — AB (ref 65–99)
Potassium: 3.1 mmol/L — ABNORMAL LOW (ref 3.5–5.1)
Sodium: 141 mmol/L (ref 135–145)

## 2015-06-14 LAB — CBC
HCT: 42.6 % (ref 40.0–52.0)
Hemoglobin: 14.7 g/dL (ref 13.0–18.0)
MCH: 29.9 pg (ref 26.0–34.0)
MCHC: 34.4 g/dL (ref 32.0–36.0)
MCV: 86.7 fL (ref 80.0–100.0)
PLATELETS: 168 10*3/uL (ref 150–440)
RBC: 4.91 MIL/uL (ref 4.40–5.90)
RDW: 13.7 % (ref 11.5–14.5)
WBC: 8.4 10*3/uL (ref 3.8–10.6)

## 2015-06-14 LAB — TROPONIN I: Troponin I: 0.03 ng/mL (ref <0.031)

## 2015-06-14 MED ORDER — SODIUM CHLORIDE 0.9 % IV BOLUS (SEPSIS)
1000.0000 mL | Freq: Once | INTRAVENOUS | Status: AC
  Administered 2015-06-14: 1000 mL via INTRAVENOUS

## 2015-06-14 MED ORDER — ONDANSETRON 4 MG PO TBDP: 4.0000 mg | ORAL_TABLET | Freq: Three times a day (TID) | ORAL | Status: DC | PRN

## 2015-06-14 NOTE — Discharge Instructions (Signed)
Please drink plenty of fluids of the next 2-3 days, follow up with her primary care physician as soon as possible for recheck/reevaluation. [facility] any further syncopal episodes, or if he develop any chest pain, pressure, trouble breathing, or any other symptom personally concerning to yourself.   Syncope Syncope is a medical term for fainting or passing out. This means you lose consciousness and drop to the ground. People are generally unconscious for less than 5 minutes. You may have some muscle twitches for up to 15 seconds before waking up and returning to normal. Syncope occurs more often in older adults, but it can happen to anyone. While most causes of syncope are not dangerous, syncope can be a sign of a serious medical problem. It is important to seek medical care.  CAUSES  Syncope is caused by a sudden drop in blood flow to the brain. The specific cause is often not determined. Factors that can bring on syncope include:  Taking medicines that lower blood pressure.  Sudden changes in posture, such as standing up quickly.  Taking more medicine than prescribed.  Standing in one place for too long.  Seizure disorders.  Dehydration and excessive exposure to heat.  Low blood sugar (hypoglycemia).  Straining to have a bowel movement.  Heart disease, irregular heartbeat, or other circulatory problems.  Fear, emotional distress, seeing blood, or severe pain. SYMPTOMS  Right before fainting, you may:  Feel dizzy or light-headed.  Feel nauseous.  See all white or all black in your field of vision.  Have cold, clammy skin. DIAGNOSIS  Your health care provider will ask about your symptoms, perform a physical exam, and perform an electrocardiogram (ECG) to record the electrical activity of your heart. Your health care provider may also perform other heart or blood tests to determine the cause of your syncope which may include:  Transthoracic  echocardiogram (TTE). During echocardiography, sound waves are used to evaluate how blood flows through your heart.  Transesophageal echocardiogram (TEE).  Cardiac monitoring. This allows your health care provider to monitor your heart rate and rhythm in real time.  Holter monitor. This is a portable device that records your heartbeat and can help diagnose heart arrhythmias. It allows your health care provider to track your heart activity for several days, if needed.  Stress tests by exercise or by giving medicine that makes the heart beat faster. TREATMENT  In most cases, no treatment is needed. Depending on the cause of your syncope, your health care provider may recommend changing or stopping some of your medicines. HOME CARE INSTRUCTIONS  Have someone stay with you until you feel stable.  Do not drive, use machinery, or play sports until your health care provider says it is okay.  Keep all follow-up appointments as directed by your health care provider.  Lie down right away if you start feeling like you might faint. Breathe deeply and steadily. Wait until all the symptoms have passed.  Drink enough fluids to keep your urine clear or pale yellow.  If you are taking blood pressure or heart medicine, get up slowly and take several minutes to sit and then stand. This can reduce dizziness. SEEK IMMEDIATE MEDICAL CARE IF:   You have a severe headache.  You have unusual pain in the chest, abdomen, or back.  You are bleeding from your mouth or rectum, or you have black or tarry stool.  You have an irregular or very fast heartbeat.  You have pain with  breathing.  You have repeated fainting or seizure-like jerking during an episode.  You faint when sitting or lying down.  You have confusion.  You have trouble walking.  You have severe weakness.  You have vision problems. If you fainted, call your local emergency services (911 in [place] Do not drive yourself to the hospital.      This information is not intended to replace advice given to you by your health care provider. Make sure you discuss any questions you have with your health care provider.   Document Released: 06/08/2005 Document Revised: 10/23/2014 Document Reviewed: 08/07/2011 Elsevier Interactive Patient Education [employer]

## 2015-06-14 NOTE — ED Provider Notes (Signed)
[facility] Emergency Department Provider Note  Time seen: 4:54 PM  I have reviewed the triage vital signs and the nursing notes.   HISTORY  Chief Complaint Loss of Consciousness    HPI Stanley Nielsen is a 74 y.o. male with a past medical history of hypertension, diabetes, dementia, who presents the emergency department with a syncopal episode. According to the patient he was recently started on Aricept for dementia this week. He states since starting the medication he has felt very nauseated, and had 3 episodes of vomiting yesterday. Admits she has not been eating or drinking much due to the nausea and vomiting. States he is feeling somewhat better today so he tried to run around his last minute shopping. Ran out of gas and had to walk approximately 1 mile. While walking he became diaphoretic, states he felt lightheaded and sat down but had a brief syncopal episode. Patient denies any chest pain now or at any time. Currently he feels quite well, denies any symptoms at this time. Denies any shortness of breath. Denies any lightheadedness. Patient states over the past 2 years he has had 2 or 3 syncopal episodes.     Past Medical History  Diagnosis Date  . Sleep apnea   . Hypertension   . Depression   . Diabetes mellitus without complication (HCC)   . Gout   . HOH (hard of hearing)   . Vertigo   . Dementia     There are no active problems to display for this patient.   Past Surgical History  Procedure Laterality Date  . Colonoscopy    . Cataract extraction w/phaco Left 04/30/2015    Procedure: CATARACT EXTRACTION PHACO AND INTRAOCULAR LENS PLACEMENT (IOC);  Surgeon: Galen Manila, MD;  Location: [facility] ORS;  Service: Ophthalmology;  Laterality: Left;  [place] 00:47 AP% 23.5 CDE 11.16 fluid pack lot # [id number]    Current Outpatient Rx  Name  Route  Sig  Dispense  Refill  . amLODipine (NORVASC) 10 MG tablet   Oral   Take 10 mg by mouth daily.          . clindamycin (CLEOCIN) 150 MG capsule   Oral   Take by mouth 4 (four) times daily.         Marland Kitchen HYDROcodone-acetaminophen (NORCO/VICODIN) 5-325 MG tablet   Oral   Take 1 tablet by mouth every 4 (four) hours as needed for moderate pain.         Marland Kitchen losartan (COZAAR) 100 MG tablet   Oral   Take 100 mg by mouth daily.         . meclizine (ANTIVERT) 25 MG tablet   Oral   Take 25 mg by mouth 3 (three) times daily.           Allergies Penicillins  No family history on file.  Social History Social History  Substance Use Topics  . Smoking status: Never Smoker   . Smokeless tobacco: Not on file  . Alcohol Use: Yes     Comment: OCC    Review of Systems Constitutional: Negative for fever. Positive for syncopal episode Cardiovascular: Negative for chest pain. Respiratory: Negative for shortness of breath. Gastrointestinal: Negative for abdominal pain. Positive for nausea and vomiting Genitourinary: Negative for dysuria Neurological: Negative for headache 10-point ROS otherwise negative.  ____________________________________________   PHYSICAL EXAM:  VITAL SIGNS: ED Triage Vitals  Enc Vitals Group     BP 06/14/15 1527 138/77 mmHg     Pulse Rate  06/14/15 1527 66     Resp 06/14/15 1527 16     Temp 06/14/15 1527 97.5 F (36.4 C)     Temp Source 06/14/15 1527 Oral     SpO2 06/14/15 1513 95 %     Weight 06/14/15 1527 220 lb (99.791 kg)     Height 06/14/15 1527 5\' 8"  (1.727 m)     Head Cir --      Peak Flow --      Pain Score --      Pain Loc --      Pain Edu? --      Excl. in GC? --     Constitutional: Alert and oriented. Well appearing and in no distress. Eyes: Normal exam ENT   Head: Normocephalic and atraumatic.   Mouth/Throat: Mucous membranes are moist. Cardiovascular: Normal rate, regular rhythm. No murmur Respiratory: Normal respiratory effort without tachypnea nor retractions. Breath sounds are clear and equal bilaterally. No  wheezes/rales/rhonchi. Gastrointestinal: Soft and nontender. No distention.  Musculoskeletal: Nontender with normal range of motion in all extremities. No lower extremity tenderness Neurologic:  Normal speech and language. No gross focal neurologic deficits Skin:  Skin is warm, dry and intact.  Psychiatric: Mood and affect are normal. Speech and behavior are normal.   ____________________________________________    EKG  EKG reviewed and interpreted by myself shows sinus rhythm at 70 bpm, narrow QRS, normal axis, normal intervals, nonspecific ST changes. No ST elevations.  ____________________________________________    INITIAL IMPRESSION / ASSESSMENT AND PLAN / ED COURSE  Pertinent labs & imaging results that were available during my care of the patient were reviewed by me and considered in my medical decision making (see chart for details).  Patient presents the emergency department after a syncopal episode. Patient admits she has not been eating very much or drinking very much over the past several days due to nausea which she was told by his neurologist is normal after starting Aricept.  Labs within normal limits. Troponin is negative. Patient has received 2 L of normal saline. We will repeat a troponin at this time, if the repeat troponin is normal plan to discharge the patient home with primary care follow-up. The patient is agreeable to this plan.  Repeat troponin negative, patient remains well, symptom free in the emergency department. We'll discharge the patient with primary care follow-up, patient agreeable.  ____________________________________________   FINAL CLINICAL IMPRESSION(S) / ED DIAGNOSES  Syncope   Minna AntisKevin Shamel Germond, MD 06/14/15 [phone]

## 2015-06-14 NOTE — ED Notes (Signed)
Pt here via [facility] after syncopal episode x2; hx of the same after getting "too hot" earlier this year, EMS reports pt ran out of gas and walked 1 mile, was pale and diaphoretic upon arrival. EMS reports pt was confused upon arrival, CBG 190. BP 160 systolic lying and 106 lying. Pt reports he vomited x1 yesterday, reports recently dx with dementia and started aricept 4 days ago. Pt denies any pain, reports "I feel like I got hit by a truck, I just feel yucky". Pt alert and oriented upon arrival to ED.

## 2015-10-01 ENCOUNTER — Encounter: Payer: Self-pay | Admitting: Emergency Medicine

## 2015-10-01 ENCOUNTER — Emergency Department
Admission: EM | Admit: 2015-10-01 | Discharge: 2015-10-01 | Disposition: A | Discharge status: Home / Self Care | Payer: Non-veteran care | Attending: Emergency Medicine | Admitting: Emergency Medicine

## 2015-10-01 DIAGNOSIS — F329 Major depressive disorder, single episode, unspecified: Secondary | ICD-10-CM | POA: Insufficient documentation

## 2015-10-01 DIAGNOSIS — R55 Syncope and collapse: Secondary | ICD-10-CM

## 2015-10-01 DIAGNOSIS — E119 Type 2 diabetes mellitus without complications: Secondary | ICD-10-CM | POA: Insufficient documentation

## 2015-10-01 DIAGNOSIS — I10 Essential (primary) hypertension: Secondary | ICD-10-CM | POA: Insufficient documentation

## 2015-10-01 LAB — COMPREHENSIVE METABOLIC PANEL
ALK PHOS: 67 U/L (ref 38–126)
ALT: 18 U/L (ref 17–63)
AST: 25 U/L (ref 15–41)
Albumin: 3.6 g/dL (ref 3.5–5.0)
Anion gap: 5 (ref 5–15)
BILIRUBIN TOTAL: 0.9 mg/dL (ref 0.3–1.2)
BUN: 13 mg/dL (ref 6–20)
CHLORIDE: 111 mmol/L (ref 101–111)
CO2: 22 mmol/L (ref 22–32)
CREATININE: 1.13 mg/dL (ref 0.61–1.24)
Calcium: 8.3 mg/dL — ABNORMAL LOW (ref 8.9–10.3)
GFR calc Af Amer: >60 mL/min (ref >60)
Glucose, Bld: 179 mg/dL — ABNORMAL HIGH (ref 65–99)
Potassium: 2.9 mmol/L — CL (ref 3.5–5.1)
Sodium: 138 mmol/L (ref 135–145)
Total Protein: 7.2 g/dL (ref 6.5–8.1)

## 2015-10-01 LAB — CBC
HEMATOCRIT: 41.1 % (ref 40.0–52.0)
HEMOGLOBIN: 14.3 g/dL (ref 13.0–18.0)
MCH: 29.4 pg (ref 26.0–34.0)
MCHC: 34.7 g/dL (ref 32.0–36.0)
MCV: 84.5 fL (ref 80.0–100.0)
Platelets: 208 10*3/uL (ref 150–440)
RBC: 4.86 MIL/uL (ref 4.40–5.90)
RDW: 13.9 % (ref 11.5–14.5)
WBC: 7.6 10*3/uL (ref 3.8–10.6)

## 2015-10-01 LAB — TROPONIN I: Troponin I: <0.03 ng/mL (ref <0.031)

## 2015-10-01 MED ORDER — SODIUM CHLORIDE 0.9 % IV BOLUS (SEPSIS)
1000.0000 mL | Freq: Once | INTRAVENOUS | Status: AC
  Administered 2015-10-01: 1000 mL via INTRAVENOUS

## 2015-10-01 NOTE — ED Notes (Addendum)
Pt presents to ED via EMS from [place] c/o emesis and syncopal episode while at breakfast this morning. [facility] called EMS. Pt arrives A&Ox4, not complaining of nausea at this time. NS bolus started PTA by EMS. CBG 151 per EMS.

## 2015-10-01 NOTE — ED Provider Notes (Signed)
[facility] Emergency Department Provider Note  ____________________________________________    I have reviewed the triage vital signs and the nursing notes.   HISTORY  Chief Complaint Loss of Consciousness and Emesis    HPI Stanley Nielsen is a 75 y.o. male who presents with complaints of a syncopal episode while at breakfast this morning. Patient reports he has had 3 syncopal episodes over the last 6 months. He denies chest pain. No palpitations. Prior to the episodes he does develop a cold sweat on his neck. Today he was sitting and had a couple of bites of egg at [employer] and developed a cold sweat on his neck. In the ED reports he feels well he has no complaints     Past Medical History  Diagnosis Date  . Sleep apnea   . Hypertension   . Depression   . Diabetes mellitus without complication (HCC)   . Gout   . HOH (hard of hearing)   . Vertigo   . Dementia     There are no active problems to display for this patient.   Past Surgical History  Procedure Laterality Date  . Colonoscopy    . Cataract extraction w/phaco Left 04/30/2015    Procedure: CATARACT EXTRACTION PHACO AND INTRAOCULAR LENS PLACEMENT (IOC);  Surgeon: Galen Manila, MD;  Location: [facility] ORS;  Service: Ophthalmology;  Laterality: Left;  [place] 00:47 AP% 23.5 CDE 11.16 fluid pack lot # [id number]    Current Outpatient Rx  Name  Route  Sig  Dispense  Refill  . amLODipine (NORVASC) 10 MG tablet   Oral   Take 10 mg by mouth daily.         . clindamycin (CLEOCIN) 150 MG capsule   Oral   Take by mouth 4 (four) times daily.         Marland Kitchen HYDROcodone-acetaminophen (NORCO/VICODIN) 5-325 MG tablet   Oral   Take 1 tablet by mouth every 4 (four) hours as needed for moderate pain.         Marland Kitchen losartan (COZAAR) 100 MG tablet   Oral   Take 100 mg by mouth daily.         . meclizine (ANTIVERT) 25 MG tablet   Oral   Take 25 mg by mouth 3 (three) times daily.         .  ondansetron (ZOFRAN ODT) 4 MG disintegrating tablet   Oral   Take 1 tablet (4 mg total) by mouth every 8 (eight) hours as needed for nausea or vomiting.   20 tablet   0     Allergies Penicillins and Sulfa antibiotics  History reviewed. No pertinent family history.  Social History Social History  Substance Use Topics  . Smoking status: Never Smoker   . Smokeless tobacco: None  . Alcohol Use: Yes     Comment: OCC    Review of Systems  Constitutional: Negative for fever. Eyes: Negative for redness ENT: Negative for sore throat Cardiovascular: Negative for chest pain, no palpitations Respiratory: Negative for shortness of breath. Gastrointestinal: Negative for abdominal pain Genitourinary: Negative for dysuria. Musculoskeletal: Negative for back pain. Skin: Negative for rash. Neurological: Negative for focal weakness, no headache Psychiatric: no anxiety    ____________________________________________   PHYSICAL EXAM:  VITAL SIGNS: ED Triage Vitals  Enc Vitals Group     BP 10/01/15 0950 134/75 mmHg     Pulse Rate 10/01/15 0950 60     Resp 10/01/15 0950 20     Temp 10/01/15  0950 98.3 F (36.8 C)     Temp Source 10/01/15 0950 Oral     SpO2 10/01/15 0950 100 %     Weight 10/01/15 0950 211 lb 12.8 oz (96.072 kg)     Height 10/01/15 0950 5\' 7"  (1.702 m)     Head Cir --      Peak Flow --      Pain Score 10/01/15 1257 0     Pain Loc --      Pain Edu? --      Excl. in GC? --      Constitutional: Alert and oriented. Well appearing and in no distress. Pleasant and interactive Eyes: Conjunctivae are normal. No erythema or injection ENT   Head: Normocephalic and atraumatic.   Mouth/Throat: Mucous membranes are moist. Cardiovascular: Normal rate, regular rhythm. Normal and symmetric distal pulses are present in the upper extremities. Respiratory: Normal respiratory effort without tachypnea nor retractions.  Gastrointestinal: Soft and non-tender in all  quadrants. No distention. There is no CVA tenderness. Genitourinary: deferred Musculoskeletal: Nontender with normal range of motion in all extremities. No lower extremity tenderness nor edema. Neurologic:  Normal speech and language. No gross focal neurologic deficits are appreciated. Skin:  Skin is warm, dry and intact. No rash noted. Psychiatric: Mood and affect are normal. Patient exhibits appropriate insight and judgment.  ____________________________________________    LABS (pertinent positives/negatives)  Labs Reviewed  COMPREHENSIVE METABOLIC PANEL - Abnormal; Notable for the following:    Potassium 2.9 (*)    Glucose, Bld 179 (*)    Calcium 8.3 (*)    All other components within normal limits  CBC  TROPONIN I    ____________________________________________   EKG  ED ECG REPORT I, Jene EveryKINNER, Kasim Mccorkle, the attending physician, personally viewed and interpreted this ECG.  Date: 10/01/2015 EKG Time: 9:50 AM Rate: 57 Rhythm: normal sinus rhythm QRS Axis: normal Intervals: normal ST/T Wave abnormalities: normal     ____________________________________________    RADIOLOGY  None  ____________________________________________   PROCEDURES  Procedure(s) performed: none  Critical Care performed: none  ____________________________________________   INITIAL IMPRESSION / ASSESSMENT AND PLAN / ED COURSE  Pertinent labs & imaging results that were available during my care of the patient were reviewed by me and considered in my medical decision making (see chart for details).  Patient well-appearing and in no distress. He is asymptomatic in the emergency department. We will check labs, placed on a cardiac monitor, check EKG and reevaluate   Patient monitored in the emergency department for approximately 3 hours. No further episodes. He feels well. No chest pain or shortness of breath. No palpitations. Lab work is unremarkable. EKG is benign. I have asked patient  to follow-up with cardiology for further workup. He agrees this plan. Return precautions discussed ____________________________________________   FINAL CLINICAL IMPRESSION(S) / ED DIAGNOSES  Final diagnoses:  Vasovagal syncope          Jene Everyobert Alistar Mcenery, MD 10/01/15 1526

## 2015-10-01 NOTE — Discharge Instructions (Signed)

## 2017-03-26 ENCOUNTER — Emergency Department (HOSPITAL_COMMUNITY): Payer: Non-veteran care

## 2017-03-26 ENCOUNTER — Inpatient Hospital Stay (HOSPITAL_COMMUNITY)
Admission: EM | Admit: 2017-03-26 | Discharge: 2017-03-30 | DRG: 064 | Disposition: A | Discharge status: Home Health | Payer: Non-veteran care | Attending: Family Medicine | Admitting: Family Medicine

## 2017-03-26 ENCOUNTER — Encounter (HOSPITAL_COMMUNITY): Payer: Self-pay | Admitting: Emergency Medicine

## 2017-03-26 DIAGNOSIS — E1122 Type 2 diabetes mellitus with diabetic chronic kidney disease: Secondary | ICD-10-CM | POA: Diagnosis present

## 2017-03-26 DIAGNOSIS — N179 Acute kidney failure, unspecified: Secondary | ICD-10-CM | POA: Diagnosis present

## 2017-03-26 DIAGNOSIS — H9190 Unspecified hearing loss, unspecified ear: Secondary | ICD-10-CM | POA: Diagnosis present

## 2017-03-26 DIAGNOSIS — Z961 Presence of intraocular lens: Secondary | ICD-10-CM | POA: Diagnosis present

## 2017-03-26 DIAGNOSIS — N183 Chronic kidney disease, stage 3 unspecified: Secondary | ICD-10-CM

## 2017-03-26 DIAGNOSIS — I129 Hypertensive chronic kidney disease with stage 1 through stage 4 chronic kidney disease, or unspecified chronic kidney disease: Secondary | ICD-10-CM | POA: Diagnosis present

## 2017-03-26 DIAGNOSIS — Z9981 Dependence on supplemental oxygen: Secondary | ICD-10-CM | POA: Diagnosis not present

## 2017-03-26 DIAGNOSIS — I10 Essential (primary) hypertension: Secondary | ICD-10-CM | POA: Diagnosis not present

## 2017-03-26 DIAGNOSIS — F0390 Unspecified dementia without behavioral disturbance: Secondary | ICD-10-CM | POA: Diagnosis present

## 2017-03-26 DIAGNOSIS — E1151 Type 2 diabetes mellitus with diabetic peripheral angiopathy without gangrene: Secondary | ICD-10-CM | POA: Diagnosis present

## 2017-03-26 DIAGNOSIS — R29810 Facial weakness: Secondary | ICD-10-CM | POA: Diagnosis present

## 2017-03-26 DIAGNOSIS — R40241 Glasgow coma scale score 13-15, unspecified time: Secondary | ICD-10-CM | POA: Diagnosis present

## 2017-03-26 DIAGNOSIS — G4733 Obstructive sleep apnea (adult) (pediatric): Secondary | ICD-10-CM | POA: Diagnosis present

## 2017-03-26 DIAGNOSIS — I519 Heart disease, unspecified: Secondary | ICD-10-CM | POA: Diagnosis not present

## 2017-03-26 DIAGNOSIS — Z9842 Cataract extraction status, left eye: Secondary | ICD-10-CM | POA: Diagnosis not present

## 2017-03-26 DIAGNOSIS — G8194 Hemiplegia, unspecified affecting left nondominant side: Secondary | ICD-10-CM | POA: Diagnosis present

## 2017-03-26 DIAGNOSIS — K219 Gastro-esophageal reflux disease without esophagitis: Secondary | ICD-10-CM | POA: Diagnosis not present

## 2017-03-26 DIAGNOSIS — E876 Hypokalemia: Secondary | ICD-10-CM | POA: Diagnosis present

## 2017-03-26 DIAGNOSIS — F03918 Unspecified dementia, unspecified severity, with other behavioral disturbance: Secondary | ICD-10-CM | POA: Diagnosis present

## 2017-03-26 DIAGNOSIS — G4730 Sleep apnea, unspecified: Secondary | ICD-10-CM

## 2017-03-26 DIAGNOSIS — Z79899 Other long term (current) drug therapy: Secondary | ICD-10-CM

## 2017-03-26 DIAGNOSIS — I6381 Other cerebral infarction due to occlusion or stenosis of small artery: Secondary | ICD-10-CM | POA: Diagnosis present

## 2017-03-26 DIAGNOSIS — E785 Hyperlipidemia, unspecified: Secondary | ICD-10-CM | POA: Diagnosis present

## 2017-03-26 DIAGNOSIS — R29711 NIHSS score 11: Secondary | ICD-10-CM | POA: Diagnosis present

## 2017-03-26 DIAGNOSIS — I159 Secondary hypertension, unspecified: Secondary | ICD-10-CM

## 2017-03-26 DIAGNOSIS — M109 Gout, unspecified: Secondary | ICD-10-CM | POA: Diagnosis present

## 2017-03-26 DIAGNOSIS — G9341 Metabolic encephalopathy: Secondary | ICD-10-CM | POA: Diagnosis present

## 2017-03-26 DIAGNOSIS — I639 Cerebral infarction, unspecified: Secondary | ICD-10-CM | POA: Diagnosis present

## 2017-03-26 DIAGNOSIS — Z880 Allergy status to penicillin: Secondary | ICD-10-CM

## 2017-03-26 DIAGNOSIS — R531 Weakness: Secondary | ICD-10-CM | POA: Diagnosis present

## 2017-03-26 DIAGNOSIS — E1129 Type 2 diabetes mellitus with other diabetic kidney complication: Secondary | ICD-10-CM | POA: Diagnosis present

## 2017-03-26 DIAGNOSIS — F329 Major depressive disorder, single episode, unspecified: Secondary | ICD-10-CM | POA: Diagnosis present

## 2017-03-26 DIAGNOSIS — E1165 Type 2 diabetes mellitus with hyperglycemia: Secondary | ICD-10-CM | POA: Diagnosis present

## 2017-03-26 DIAGNOSIS — R260 Ataxic gait: Secondary | ICD-10-CM | POA: Diagnosis present

## 2017-03-26 DIAGNOSIS — R55 Syncope and collapse: Secondary | ICD-10-CM | POA: Diagnosis not present

## 2017-03-26 DIAGNOSIS — I5189 Other ill-defined heart diseases: Secondary | ICD-10-CM

## 2017-03-26 DIAGNOSIS — I361 Nonrheumatic tricuspid (valve) insufficiency: Secondary | ICD-10-CM | POA: Diagnosis not present

## 2017-03-26 DIAGNOSIS — E119 Type 2 diabetes mellitus without complications: Secondary | ICD-10-CM | POA: Diagnosis present

## 2017-03-26 DIAGNOSIS — Z23 Encounter for immunization: Secondary | ICD-10-CM

## 2017-03-26 HISTORY — DX: Chronic kidney disease, stage 3 (moderate): N18.3

## 2017-03-26 HISTORY — DX: Chronic kidney disease, stage 3 unspecified: N18.30

## 2017-03-26 LAB — CBC
HEMATOCRIT: 43.8 % (ref 39.0–52.0)
Hemoglobin: 15.5 g/dL (ref 13.0–17.0)
MCH: 30.2 pg (ref 26.0–34.0)
MCHC: 35.4 g/dL (ref 30.0–36.0)
MCV: 85.4 fL (ref 78.0–100.0)
PLATELETS: 201 10*3/uL (ref 150–400)
RBC: 5.13 MIL/uL (ref 4.22–5.81)
RDW: 13.1 % (ref 11.5–15.5)
WBC: 8.7 10*3/uL (ref 4.0–10.5)

## 2017-03-26 LAB — URINALYSIS, ROUTINE W REFLEX MICROSCOPIC
Bilirubin Urine: NEGATIVE
Glucose, UA: 50 mg/dL — AB
Hgb urine dipstick: NEGATIVE
Ketones, ur: 5 mg/dL — AB
Leukocytes, UA: NEGATIVE
Nitrite: NEGATIVE
PROTEIN: 100 mg/dL — AB
Specific Gravity, Urine: 1.04 — ABNORMAL HIGH (ref 1.005–1.030)
pH: 5 (ref 5.0–8.0)

## 2017-03-26 LAB — DIFFERENTIAL
BASOS ABS: 0.1 10*3/uL (ref 0.0–0.1)
BASOS PCT: 1 %
EOS ABS: 0.1 10*3/uL (ref 0.0–0.7)
Eosinophils Relative: 2 %
Lymphocytes Relative: 37 %
Lymphs Abs: 3.2 10*3/uL (ref 0.7–4.0)
Monocytes Absolute: 0.7 10*3/uL (ref 0.1–1.0)
Monocytes Relative: 8 %
NEUTROS ABS: 4.7 10*3/uL (ref 1.7–7.7)
NEUTROS PCT: 54 %

## 2017-03-26 LAB — I-STAT CHEM 8, ED
BUN: 15 mg/dL (ref 6–20)
CALCIUM ION: 1.11 mmol/L — AB (ref 1.15–1.40)
CREATININE: 1.4 mg/dL — AB (ref 0.61–1.24)
Chloride: 107 mmol/L (ref 101–111)
Glucose, Bld: 201 mg/dL — ABNORMAL HIGH (ref 65–99)
HEMATOCRIT: 43 % (ref 39.0–52.0)
Hemoglobin: 14.6 g/dL (ref 13.0–17.0)
Potassium: 3.2 mmol/L — ABNORMAL LOW (ref 3.5–5.1)
SODIUM: 143 mmol/L (ref 135–145)
TCO2: 23 mmol/L (ref 22–32)

## 2017-03-26 LAB — APTT: APTT: 30 s (ref 24–36)

## 2017-03-26 LAB — COMPREHENSIVE METABOLIC PANEL
ALBUMIN: 3.9 g/dL (ref 3.5–5.0)
ALT: 32 U/L (ref 17–63)
AST: 57 U/L — AB (ref 15–41)
Alkaline Phosphatase: 77 U/L (ref 38–126)
Anion gap: 9 (ref 5–15)
BUN: 15 mg/dL (ref 6–20)
CHLORIDE: 107 mmol/L (ref 101–111)
CO2: 22 mmol/L (ref 22–32)
CREATININE: 1.6 mg/dL — AB (ref 0.61–1.24)
Calcium: 9.2 mg/dL (ref 8.9–10.3)
GFR calc Af Amer: 47 mL/min — ABNORMAL LOW (ref >60)
GFR, EST NON AFRICAN AMERICAN: 41 mL/min — AB (ref >60)
Glucose, Bld: 198 mg/dL — ABNORMAL HIGH (ref 65–99)
POTASSIUM: 3.9 mmol/L (ref 3.5–5.1)
SODIUM: 138 mmol/L (ref 135–145)
Total Bilirubin: 2 mg/dL — ABNORMAL HIGH (ref 0.3–1.2)
Total Protein: 7.8 g/dL (ref 6.5–8.1)

## 2017-03-26 LAB — I-STAT TROPONIN, ED: TROPONIN I, POC: 0.01 ng/mL (ref 0.00–0.08)

## 2017-03-26 LAB — PROTIME-INR
INR: 1.06
PROTHROMBIN TIME: 13.7 s (ref 11.4–15.2)

## 2017-03-26 LAB — CBG MONITORING, ED: Glucose-Capillary: 186 mg/dL — ABNORMAL HIGH (ref 65–99)

## 2017-03-26 LAB — AMMONIA: Ammonia: 35 umol/L (ref 9–35)

## 2017-03-26 MED ORDER — ZOLPIDEM TARTRATE 5 MG PO TABS: 5.0000 mg | ORAL_TABLET | Freq: Every evening | ORAL | Status: DC | PRN

## 2017-03-26 MED ORDER — SODIUM CHLORIDE 0.9 % IV SOLN
INTRAVENOUS | Status: DC
  Administered 2017-03-27: via INTRAVENOUS

## 2017-03-26 MED ORDER — INSULIN ASPART 100 UNIT/ML ~~LOC~~ SOLN
0.0000 [IU] | Freq: Three times a day (TID) | SUBCUTANEOUS | Status: DC
  Administered 2017-03-27: 3 [IU] via SUBCUTANEOUS
  Administered 2017-03-27: 5 [IU] via SUBCUTANEOUS
  Administered 2017-03-27 – 2017-03-28 (×2): 2 [IU] via SUBCUTANEOUS
  Administered 2017-03-28: 1 [IU] via SUBCUTANEOUS
  Administered 2017-03-28 – 2017-03-29 (×2): 2 [IU] via SUBCUTANEOUS
  Administered 2017-03-29: 1 [IU] via SUBCUTANEOUS
  Administered 2017-03-29: 2 [IU] via SUBCUTANEOUS
  Administered 2017-03-30 (×2): 1 [IU] via SUBCUTANEOUS

## 2017-03-26 MED ORDER — STROKE: EARLY STAGES OF RECOVERY BOOK
Freq: Once | Status: AC
  Administered 2017-03-26: 23:00:00
  Filled 2017-03-26: qty 1

## 2017-03-26 MED ORDER — IOPAMIDOL (ISOVUE-370) INJECTION 76%
INTRAVENOUS | Status: AC
  Administered 2017-03-26: 50 mL
  Filled 2017-03-26: qty 50

## 2017-03-26 MED ORDER — ENOXAPARIN SODIUM 40 MG/0.4ML ~~LOC~~ SOLN
40.0000 mg | SUBCUTANEOUS | Status: DC
  Administered 2017-03-27 – 2017-03-29 (×4): 40 mg via SUBCUTANEOUS
  Filled 2017-03-26 (×4): qty 0.4

## 2017-03-26 MED ORDER — ATORVASTATIN CALCIUM 40 MG PO TABS
40.0000 mg | ORAL_TABLET | Freq: Every day | ORAL | Status: DC
  Administered 2017-03-27 – 2017-03-29 (×3): 40 mg via ORAL
  Filled 2017-03-26 (×3): qty 1

## 2017-03-26 MED ORDER — SENNOSIDES-DOCUSATE SODIUM 8.6-50 MG PO TABS: 1.0000 | ORAL_TABLET | Freq: Every evening | ORAL | Status: DC | PRN

## 2017-03-26 MED ORDER — ACETAMINOPHEN 160 MG/5ML PO SOLN: 650.0000 mg | ORAL | Status: DC | PRN

## 2017-03-26 MED ORDER — HYDRALAZINE HCL 20 MG/ML IJ SOLN: 5.0000 mg | INTRAMUSCULAR | Status: DC | PRN

## 2017-03-26 MED ORDER — ASPIRIN 300 MG RE SUPP: 300.0000 mg | Freq: Every day | RECTAL | Status: DC

## 2017-03-26 MED ORDER — ACETAMINOPHEN 650 MG RE SUPP: 650.0000 mg | RECTAL | Status: DC | PRN

## 2017-03-26 MED ORDER — ONDANSETRON HCL 4 MG/2ML IJ SOLN: 4.0000 mg | Freq: Three times a day (TID) | INTRAMUSCULAR | Status: DC | PRN

## 2017-03-26 MED ORDER — PANTOPRAZOLE SODIUM 40 MG PO TBEC
40.0000 mg | DELAYED_RELEASE_TABLET | Freq: Every day | ORAL | Status: DC
  Administered 2017-03-27 – 2017-03-30 (×4): 40 mg via ORAL
  Filled 2017-03-26 (×4): qty 1

## 2017-03-26 MED ORDER — ACETAMINOPHEN 325 MG PO TABS: 650.0000 mg | ORAL_TABLET | ORAL | Status: DC | PRN

## 2017-03-26 MED ORDER — GALANTAMINE HYDROBROMIDE 4 MG PO TABS
4.0000 mg | ORAL_TABLET | Freq: Two times a day (BID) | ORAL | Status: DC
  Administered 2017-03-27 – 2017-03-30 (×7): 4 mg via ORAL
  Filled 2017-03-26 (×9): qty 1

## 2017-03-26 MED ORDER — POTASSIUM CHLORIDE 20 MEQ/15ML (10%) PO SOLN: 20.0000 meq | Freq: Once | ORAL | Status: DC

## 2017-03-26 MED ORDER — ASPIRIN 325 MG PO TABS
325.0000 mg | ORAL_TABLET | Freq: Every day | ORAL | Status: DC
  Administered 2017-03-27 – 2017-03-30 (×4): 325 mg via ORAL
  Filled 2017-03-26 (×5): qty 1

## 2017-03-26 MED ORDER — INSULIN ASPART 100 UNIT/ML ~~LOC~~ SOLN: 0.0000 [IU] | Freq: Every day | SUBCUTANEOUS | Status: DC

## 2017-03-26 MED ORDER — SODIUM CHLORIDE 0.9 % IV BOLUS (SEPSIS)
1000.0000 mL | Freq: Once | INTRAVENOUS | Status: AC
  Administered 2017-03-26: 1000 mL via INTRAVENOUS

## 2017-03-26 NOTE — ED Notes (Signed)
This RN spoke with the pts daughter Cheskel Silverio who reports being the pts POA in [place], the daughter lives in [place] and requests to be update on the pts MRI results when available, the pt can be contacted via phone at [phone] or via email at [email or web address] primary RN aware

## 2017-03-26 NOTE — ED Notes (Signed)
PT FAILED SWALLOW SCREEN D/T COUGHING AFTER WATER. KEEP NPO.

## 2017-03-26 NOTE — ED Triage Notes (Signed)
Pt arrives from home with [facility] after falling going up steps and after sitting down per caregiver pt became unresponsive. On ems arrival pt was confused and had gcs of 13. Pt activated as code stroke andf met at bridge by stroke team and taken to CT on arrival.

## 2017-03-26 NOTE — H&P (Signed)
History and Physical    Stanley Nielsen ZOX:[id number] DOB: Mar 10, 1941 DOA: 03/26/2017  Referring MD/NP/PA:   PCP: Reid, [place], MD   Patient coming from:  The patient is coming from home.  At baseline, pt is independent for most of ADL.   Chief Complaint: fall, weakness in arms and left leg, AMS  HPI: Stanley Nielsen is a 76 y.o. male with medical history significant of hypertension, diet-controlled diabetes, GERD, gout, depression, dementia, vertigo, OSA on CPAP, syncope, CKD-3, who presents with fall, weakness in arms and legs.  Per report, pt came home after shopping, and fell when he was going up the steps, then became unresponsive about 2:30 PM. There is a housekeeper in the house, who witnessed this fall and called EMS. On ems arrival, pt was confused and had gcs of 13. When I saw pt in ED, he is mental status has improved. He is oriented 3. He reports that he had weakness in both arms and the left leg. He denies vision change or hearing loss. No slurred speech or facial droop. Currently patient does not have chest pain, SOB, cough, fever or chills. He vomited once after he had MRI of the brain, currently no nausea, vomiting, diarrhea or abdominal pain. Denies symptoms of UTI.was negative. He has skin abrasion in left shin.  ED Course: pt was found to have WBC 8.7, INR 1.06, ammonia 35, pending urinalysis, worsening renal function, temperature normal, no tachycardia, oxygen saturation 95% on room air. CT head negative. MRI of the brain showed acute stroke. Patient is admitted to telemetry bed as inpatient. Neurology was consulted.  MRI of brain showed:  1. 14 mm acute ischemic nonhemorrhagic perforator type infarct involving the right paramedian pons. 2. Remote lacunar infarcts involving the right basal ganglia and right thalamus. 3. Moderate cerebral atrophy with chronic small vessel ischemic disease.  Review of Systems:   General: no fevers, chills, no body weight gain, has  fatigue HEENT: no blurry vision, hearing changes or sore throat Respiratory: no dyspnea, coughing, wheezing CV: no chest pain, no palpitations GI: has nausea, vomiting, no abdominal pain, diarrhea, constipation GU: no dysuria, burning on urination, increased urinary frequency, hematuria  Ext: no leg edema Neuro: has fall, AMS, weakness in arms and left leg. Skin: He has skin abrasion in left shin. MSK: No muscle spasm, no deformity, no limitation of range of movement in spin Heme: No easy bruising.  Travel history: No recent long distant travel.  Allergy:  Allergies  Allergen Reactions  . Penicillins Hives    Has patient had a PCN reaction causing immediate rash, facial/tongue/throat swelling, SOB or lightheadedness with hypotension: No Has patient had a PCN reaction causing severe rash involving mucus membranes or skin necrosis: No Has patient had a PCN reaction that required hospitalization: No Has patient had a PCN reaction occurring within the last 10 years: No If all of the above answers are "NO", then may proceed with Cephalosporin use.  . Sulfa Antibiotics Nausea And Vomiting    Past Medical History:  Diagnosis Date  . CKD (chronic kidney disease), stage III (HCC)   . Dementia   . Depression   . Gout   . HOH (hard of hearing)   . Hypertension   . Sleep apnea   . Vertigo     Past Surgical History:  Procedure Laterality Date  . CATARACT EXTRACTION W/PHACO Left 04/30/2015   Procedure: CATARACT EXTRACTION PHACO AND INTRAOCULAR LENS PLACEMENT (IOC);  Surgeon: Galen Manila, MD;  Location: [facility] ORS;  Service: Ophthalmology;  Laterality: Left;  [place] 00:47 AP% 23.5 CDE 11.16 fluid pack lot # [id number]  . COLONOSCOPY      Social History:  reports that he has never smoked. He does not have any smokeless tobacco history on file. He reports that he drinks alcohol. He reports that he does not use drugs.  Family History:  Family History  Problem Relation Age of Onset  .  Dementia Mother   . Alcoholism Father      Prior to Admission medications   Medication Sig Start Date End Date Taking? Authorizing Provider  amLODipine (NORVASC) 10 MG tablet Take 10 mg by mouth daily.   Yes [provider]  galantamine (RAZADYNE) 4 MG tablet Take 4 mg by mouth 2 (two) times daily with a meal.   Yes [provider]  losartan (COZAAR) 50 MG tablet Take 50 mg by mouth daily.    Yes [provider]  omeprazole (PRILOSEC) 20 MG capsule Take 20 mg by mouth daily.   Yes [provider]  ondansetron (ZOFRAN ODT) 4 MG disintegrating tablet Take 1 tablet (4 mg total) by mouth every 8 (eight) hours as needed for nausea or vomiting. 06/14/15  Yes Minna Antis, MD  PRESCRIPTION MEDICATION Apply 1 application topically daily as needed (use on his legs for iching).   Yes [provider]    Physical Exam: Vitals:   03/26/17 2035 03/26/17 2045 03/26/17 2115 03/26/17 2200  BP: (!) 171/88 135/83 (!) 157/94 134/66  Pulse: 71 69 78 85  Resp: Temp: 98.2 F (36.8 C)     SpO2: 97% 96% 97% 98%  Weight:       General: Not in acute distress HEENT:       Eyes: PERRL, EOMI, no scleral icterus.       ENT: No discharge from the ears and nose, no pharynx injection, no tonsillar enlargement.        Neck: No JVD, no bruit, no mass felt. Heme: No neck lymph node enlargement. Cardiac: S1/S2, RRR, No murmurs, No gallops or rubs. Respiratory:  No rales, wheezing, rhonchi or rubs. GI: Soft, nondistended, nontender, no rebound pain, no organomegaly, BS present. GU: No hematuria Ext: No pitting leg edema bilaterally. 2+DP/PT pulse bilaterally. Musculoskeletal: No joint deformities, No joint redness or warmth, no limitation of ROM in spin. Skin: No rashes.  Neuro: Alert, oriented X3, cranial nerves II-XII grossly intact, moves all extremities normally. Muscle strength 5/5 in all extremities, sensation to light touch intact. Brachial reflex  2+ bilaterally. Negative Babinski's sign.  Psych: Patient is not psychotic, no suicidal or hemocidal ideation.  Labs on Admission: I have personally reviewed following labs and imaging studies  CBC:  Recent Labs Lab 03/26/17 1448 03/26/17 1454  WBC 8.7  --   NEUTROABS 4.7  --   HGB 15.5 14.6  HCT 43.8 43.0  MCV 85.4  --   PLT 201  --    Basic Metabolic Panel:  Recent Labs Lab 03/26/17 1448 03/26/17 1454  NA 138 143  K 3.9 3.2*  CL 107 107  CO2 22  --   GLUCOSE 198* 201*  BUN 15 15  CREATININE 1.60* 1.40*  CALCIUM 9.2  --    GFR: CrCl cannot be calculated (Unknown ideal weight.). Liver Function Tests:  Recent Labs Lab 03/26/17 1448  AST 57*  ALT 32  ALKPHOS 77  BILITOT 2.0*  PROT 7.8  ALBUMIN 3.9   No results for input(s):  LIPASE, AMYLASE in the last 168 hours.  Recent Labs Lab 03/26/17 1729  AMMONIA 35   Coagulation Profile:  Recent Labs Lab 03/26/17 1448  INR 1.06   Cardiac Enzymes: No results for input(s): CKTOTAL, CKMB, CKMBINDEX, TROPONINI in the last 168 hours. BNP (last 3 results) No results for input(s): PROBNP in the last 8760 hours. HbA1C: No results for input(s): HGBA1C in the last 72 hours. CBG:  Recent Labs Lab 03/26/17 2115  GLUCAP 186*   Lipid Profile: No results for input(s): CHOL, HDL, LDLCALC, TRIG, CHOLHDL, LDLDIRECT in the last 72 hours. Thyroid Function Tests: No results for input(s): TSH, T4TOTAL, FREET4, T3FREE, THYROIDAB in the last 72 hours. Anemia Panel: No results for input(s): VITAMINB12, FOLATE, FERRITIN, TIBC, IRON, RETICCTPCT in the last 72 hours. Urine analysis:    Component Value Date/Time   COLORURINE YELLOW (A) 06/14/2015 1700   APPEARANCEUR CLEAR (A) 06/14/2015 1700   LABSPEC 1.011 06/14/2015 1700   PHURINE 6.0 06/14/2015 1700   GLUCOSEU NEGATIVE 06/14/2015 1700   HGBUR NEGATIVE 06/14/2015 1700   BILIRUBINUR NEGATIVE 06/14/2015 1700   KETONESUR TRACE (A) 06/14/2015 1700   PROTEINUR 30 (A)  06/14/2015 1700   NITRITE NEGATIVE 06/14/2015 1700   LEUKOCYTESUR NEGATIVE 06/14/2015 1700   Sepsis Labs: (procalcitonin:4,lacticidven:4) )No results found for this or any previous visit (from the past 240 hour(s)).   Radiological Exams on Admission: Ct Angio Head W Or Wo Contrast  Result Date: 03/26/2017 CLINICAL DATA:  Stroke. Neurological deficit for greater than 6 hours. EXAM: CT ANGIOGRAPHY HEAD AND NECK TECHNIQUE: Multidetector CT imaging of the head and neck was performed using the standard protocol during bolus administration of intravenous contrast. Multiplanar CT image reconstructions and MIPs were obtained to evaluate the vascular anatomy. Carotid stenosis measurements (when applicable) are obtained utilizing NASCET criteria, using the distal internal carotid diameter as the denominator. CONTRAST:  50 mL Isovue 370 COMPARISON:  Brain MRI 03/26/2017 FINDINGS: CTA NECK FINDINGS Aortic arch: There is no aneurysm or dissection of the visualized ascending aorta or aortic arch. There is a normal variant aortic arch branching pattern with the brachiocephalic and left common carotid arteries sharing a common origin. The visualized proximal subclavian arteries are normal. Right carotid system: The right common carotid origin is widely patent. There is no common carotid or internal carotid artery dissection or aneurysm. Atherosclerotic calcification at the carotid bifurcation without hemodynamically significant stenosis. Left carotid system: The left common carotid origin is widely patent. There is no common carotid or internal carotid artery dissection or aneurysm. No hemodynamically significant stenosis. Vertebral arteries: The vertebral system is left dominant. Both vertebral artery origins are normal. Both vertebral arteries are normal to their confluence with the basilar artery. Skeleton: There is no bony spinal canal stenosis. No lytic or blastic lesions. Other neck: 9 mm left level IIa  lymph node. No pharyngeal or laryngeal abnormality. Normal thyroid. Upper chest: No pneumothorax or pleural effusion. No nodules or masses. Review of the MIP images confirms the above findings CTA HEAD FINDINGS Anterior circulation: --Intracranial internal carotid arteries: Bilateral atherosclerotic calcification of the internal carotid arteries at the skullbase without hemodynamically significant stenosis. --Anterior cerebral arteries: Normal. --Middle cerebral arteries: Multifocal moderate narrowing of the M2 segments of the left middle cerebral artery. Mild narrowing of the right M1 segment and severe narrowing of the right M2 inferior division. --Posterior communicating arteries: Absent bilaterally. Posterior circulation: --Posterior cerebral arteries: Moderate to severe bilateral P2 segment stenosis. --Superior cerebellar arteries: Normal. --Basilar artery: Normal. --Anterior inferior  cerebellar arteries: Not clearly visualized, which is not uncommon. --Posterior inferior cerebellar arteries: Normal. Venous sinuses: As permitted by contrast timing, patent. Anatomic variants: None Delayed phase: No parenchymal contrast enhancement. There is periventricular hypoattenuation compatible with chronic microvascular disease. Review of the MIP images confirms the above findings IMPRESSION: 1. No emergent large vessel occlusion. 2. 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 3. Moderate stenosis of the left MCA M2 segments and moderate to severe bilateral posterior cerebral artery P2 segment stenosis. 4. No dissection, occlusion or stenosis of the major cervical arteries. 5. Bilateral calcific atherosclerosis of the internal carotid arteries at the skullbase without high-grade stenosis. Electronically Signed   By: Deatra Robinson M.D.   On: 03/26/2017 22:21   Ct Angio Neck W And/or Wo Contrast  Result Date: 03/26/2017 CLINICAL DATA:  Stroke. Neurological deficit for greater than 6 hours. EXAM: CT  ANGIOGRAPHY HEAD AND NECK TECHNIQUE: Multidetector CT imaging of the head and neck was performed using the standard protocol during bolus administration of intravenous contrast. Multiplanar CT image reconstructions and MIPs were obtained to evaluate the vascular anatomy. Carotid stenosis measurements (when applicable) are obtained utilizing NASCET criteria, using the distal internal carotid diameter as the denominator. CONTRAST:  50 mL Isovue 370 COMPARISON:  Brain MRI 03/26/2017 FINDINGS: CTA NECK FINDINGS Aortic arch: There is no aneurysm or dissection of the visualized ascending aorta or aortic arch. There is a normal variant aortic arch branching pattern with the brachiocephalic and left common carotid arteries sharing a common origin. The visualized proximal subclavian arteries are normal. Right carotid system: The right common carotid origin is widely patent. There is no common carotid or internal carotid artery dissection or aneurysm. Atherosclerotic calcification at the carotid bifurcation without hemodynamically significant stenosis. Left carotid system: The left common carotid origin is widely patent. There is no common carotid or internal carotid artery dissection or aneurysm. No hemodynamically significant stenosis. Vertebral arteries: The vertebral system is left dominant. Both vertebral artery origins are normal. Both vertebral arteries are normal to their confluence with the basilar artery. Skeleton: There is no bony spinal canal stenosis. No lytic or blastic lesions. Other neck: 9 mm left level IIa lymph node. No pharyngeal or laryngeal abnormality. Normal thyroid. Upper chest: No pneumothorax or pleural effusion. No nodules or masses. Review of the MIP images confirms the above findings CTA HEAD FINDINGS Anterior circulation: --Intracranial internal carotid arteries: Bilateral atherosclerotic calcification of the internal carotid arteries at the skullbase without hemodynamically significant  stenosis. --Anterior cerebral arteries: Normal. --Middle cerebral arteries: Multifocal moderate narrowing of the M2 segments of the left middle cerebral artery. Mild narrowing of the right M1 segment and severe narrowing of the right M2 inferior division. --Posterior communicating arteries: Absent bilaterally. Posterior circulation: --Posterior cerebral arteries: Moderate to severe bilateral P2 segment stenosis. --Superior cerebellar arteries: Normal. --Basilar artery: Normal. --Anterior inferior cerebellar arteries: Not clearly visualized, which is not uncommon. --Posterior inferior cerebellar arteries: Normal. Venous sinuses: As permitted by contrast timing, patent. Anatomic variants: None Delayed phase: No parenchymal contrast enhancement. There is periventricular hypoattenuation compatible with chronic microvascular disease. Review of the MIP images confirms the above findings IMPRESSION: 1. No emergent large vessel occlusion. 2. 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 3. Moderate stenosis of the left MCA M2 segments and moderate to severe bilateral posterior cerebral artery P2 segment stenosis. 4. No dissection, occlusion or stenosis of the major cervical arteries. 5. Bilateral calcific atherosclerosis of the internal carotid arteries at the skullbase without high-grade  stenosis. Electronically Signed   By: Deatra Robinson M.D.   On: 03/26/2017 22:21   Mr Brain Wo Contrast  Result Date: 03/26/2017 CLINICAL DATA:  Initial evaluation for acute syncope. EXAM: MRI HEAD WITHOUT CONTRAST TECHNIQUE: Multiplanar, multiecho pulse sequences of the brain and surrounding structures were obtained without intravenous contrast. COMPARISON:  Priors CT from earlier the same day. FINDINGS: Brain: Moderately advance generalized cerebral atrophy. Patchy and confluent T2/FLAIR hyperintensity within the periventricular and deep white matter of both cerebral hemispheres most consistent with chronic small vessel  ischemic disease, also moderate nature. There is patchy diffusion abnormality involving the mid and ventral right paramedian pons (series 3, image 15), consistent with acute ischemic perforator type infarct. This measures approximately 14 mm in length. No associated mass effect or hemorrhage. No other evidence for acute or subacute ischemia. Gray-white matter differentiation otherwise maintained. Small remote lacunar infarcts noted within the right basal ganglia and right thalamus. Remote Single punctate chronic microhemorrhage noted within the ventral right thalamus. No other evidence for acute or chronic intracranial hemorrhage. No mass lesion, midline shift or mass effect. Ventricular prominence related to global parenchymal volume loss without hydrocephalus. No extra-axial fluid collection. Major dural sinuses are grossly patent. Pituitary gland and suprasellar region within normal limits. Midline structures intact and normal. Vascular: Major intracranial vascular flow voids maintained. Skull and upper cervical spine: Craniocervical junction normal. Upper cervical spine within normal limits. Bone marrow signal intensity normal. No scalp soft tissue abnormality. Sinuses/Orbits: Globes and orbital soft tissues within normal limits. Patient status post lens extraction on the left. Mild mucosal thickening noted within the ethmoidal air cells and maxillary sinuses superimposed retention cysts noted within left maxillary sinus. Paranasal sinuses otherwise clear. No mastoid effusion. Inner ear structures normal. Other: None. IMPRESSION: 1. 14 mm acute ischemic nonhemorrhagic perforator type infarct involving the right paramedian pons. 2. Remote lacunar infarcts involving the right basal ganglia and right thalamus. 3. Moderate cerebral atrophy with chronic small vessel ischemic disease. Electronically Signed   By: Rise Mu M.D.   On: 03/26/2017 20:09   Ct Head Code Stroke Wo Contrast  Result Date:  03/26/2017 CLINICAL DATA:  Code stroke. Syncopal episode, last seen normal 1345 hours. EXAM: CT HEAD WITHOUT CONTRAST TECHNIQUE: Contiguous axial images were obtained from the base of the skull through the vertex without intravenous contrast. COMPARISON:  None. FINDINGS: Brain: No evidence of acute infarction, hemorrhage, hydrocephalus, extra-axial collection or mass lesion/mass effect. Advanced atrophy. Hypoattenuation of white matter, consistent with chronic microvascular ischemic change. Chronic infarction affecting RIGHT basal ganglia, predominantly caudate and anterior internal capsule. Vascular: Calcification of the cavernous internal carotid arteries consistent with cerebrovascular atherosclerotic disease. No signs of intracranial large vessel occlusion. Skull: Normal. Negative for fracture or focal lesion. Sinuses/Orbits: No acute finding. Other: None. ASPECTS [facility] Stroke Program Early CT Score) - Ganglionic level infarction (caudate, lentiform nuclei, internal capsule, insula, M1-M3 cortex): 7 - Supraganglionic infarction (M4-M6 cortex): 3 Total score (0-10 with 10 being normal): 10 IMPRESSION: 1. Atrophy and small vessel disease. No acute intracranial findings. 2. ASPECTS is 10. These results were called by telephone at the time of interpretation on 03/26/2017 at 3:03 pm to Dr. Wilford Corner, who verbally acknowledged these results. Electronically Signed   By: Elsie Stain M.D.   On: 03/26/2017 15:06     EKG: Independently reviewed.  Sinus rhythm, QTC 468, LAD, early R-wave progression, T-wave flattening  Assessment/Plan Principal Problem:   Ischemic stroke [facility]) Active Problems:   Dementia   Hypertension  GERD (gastroesophageal reflux disease)   Acute renal failure superimposed on stage 3 chronic kidney disease (HCC)   Acute metabolic encephalopathy   Type II diabetes mellitus with renal manifestations (HCC)   Ischemic stroke [facility]): MRI of brain showed 14 mm acute ischemic nonhemorrhagic  perforator type infarct involving the right paramedian pons. Neurology was consulted. Dr. recommended to get CT angiogram of neck and head.  - will place to tele bed - Appreciate Dr.  consultation, the follow-up recommendations -Atrial fibrillation: not present  - Risk factor modification: HgbA1c, fasting lipid panel - CTA of neck and Head  - PT consult, OT consult - Bedside swallowing screen was ordered - 2 d Echocardiogram  - Ekg  - Aspirin  AoCKD-III: Baseline Cre is 1.1-1.3, pt's Cre is 1.60 on admission. Likely due to prerenal secondary to dehydration and continuation of  ARB - IVF: 1L NS, then 75 cc/h - Follow up renal function by BMP - Hold cozarr  Diet controlled diabetes with renal complications: Last A1c 6.9, well controlled. Patient is not taking Meds at home -SSI  GERD: -Protonix  Hypertension: Blood pressure 157/94 -Hold amlodipine and Cozaar to allow permissive hypertension -IV hydralazine when necessary for SBP<220  Dementia: No behavioral disturbance -Continue galantamine  Acute metabolic encephalopathy: due to stroke. Improved. -Frequent neuro check   DVT ppx: SQ Lovenox Code Status: Full code Family Communication: None at bed side.  Disposition Plan:  Anticipate discharge back to previous home environment Consults called:  Neuro, dr.Arora and Dr. Amada Jupiter Admission status:   Inpatient/tele     Date of Service 03/26/2017    Lorretta Harp [facility] Pager [phone]  If 7PM-7AM, please contact night-coverage [email or web address] Password [email or web address] 03/26/2017, 10:47 PM

## 2017-03-26 NOTE — ED Notes (Signed)
Attempted report 

## 2017-03-26 NOTE — ED Notes (Signed)
Pt vomiting, did not perform fluid challenge at this time as a result.

## 2017-03-26 NOTE — Consult Note (Addendum)
Neurology Consultation  Reason for Consult: Acute code stroke Referring Physician: Dr. Adela Lank  CC: possible left-sided weakness after passing out  History is obtained from: initially from EMS, later from patient  HPI: Stanley Nielsen is a 76 y.o. 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 He did not hit his head. He lives at home with her daughter was out of the country at this time. There is a housekeeper in the house, who witnessed this fall and called EMS. When EMS arrived, he was unable to answer their questions. They noted a left-sided facial droop and left arm weakness. He was brought in as a code stroke. I evaluated him on the emergency room bridgewith the stroke team. He was taken in for a stat CT scan of the head. There was no clinical evidence of large vessel occlusion.  Later on he was more coherent and was able to provide history. He said that he has had these episodes where he feels diaphoretic and after that he passes out. He has been evaluated for cardiogenic syncope.  LKW: 2:30 PM on 03/26/2017 tpa given?: no, low suspicion for stroke - non focal encephalopathic exam Premorbid modified Rankin scale (mRS)2  ROS:  Unable to obtain due to altered mental status.   Past Medical History:  Diagnosis Date  . Dementia   . Depression   . Diabetes mellitus without complication (HCC)   . Gout   . HOH (hard of hearing)   . Hypertension   . Sleep apnea   . Vertigo     No family history on file.   Social History:   reports that he has never smoked. He does not have any smokeless tobacco history on file. He reports that he drinks alcohol. His drug history is not on file.  Medications No current facility-administered medications for this encounter.    Current Outpatient Prescriptions:  .  amLODipine (NORVASC) 10 MG tablet, Take 10 mg by mouth daily., Disp: , Rfl:  .  clindamycin (CLEOCIN) 150 MG capsule, Take by mouth 4 (four) times daily., Disp: , Rfl:  .  HYDROcodone-acetaminophen (NORCO/VICODIN) 5-325 MG tablet, Take 1 tablet by mouth every 4 (four) hours as needed for moderate pain., Disp: , Rfl:  .  losartan (COZAAR) 100 MG tablet, Take 100 mg by mouth daily., Disp: , Rfl:  .  meclizine (ANTIVERT) 25 MG tablet, Take 25 mg by mouth 3 (three) times daily., Disp: , Rfl:  .  ondansetron (ZOFRAN ODT) 4 MG disintegrating tablet, Take 1 tablet (4 mg total) by mouth every 8 (eight) hours as needed for nausea or vomiting., Disp: 20 tablet, Rfl: 0   Exam: Current vital signs: BP 128/76   Pulse 74   Resp 18   Wt 78 kg (171 lb 15.3 oz)   SpO2 100%   BMI 26.93 kg/m  Vital signs in last 24 hours: Pulse Rate:  [63-74] 74 (10/05 1615) Resp:  [13-22] 18 (10/05 1615) BP: (109-137)/(61-93) 128/76 (10/05 1615) SpO2:  [95 %-100 %] 100 % (10/05 1615) Weight:  [78 kg (171 lb 15.3 oz)] 78 kg (171 lb 15.3 oz) (10/05 1502) Gen.: lethargic appearing but awake, opens eyes to voice, Follow some commands HEENT: Normocephalic atraumatic, dry or mucous membranes, no nephropathy or thyromegaly CVS: S1 and S2  heart irregularrate rhythmperipheral pulses,no carotid bruit Abdomen soft nondistended Extremities warm well perfused Neurological exam  Mental status: drowsy, opens eyes to voice, does not follow commands. Speech is dysarthric. Able to name comprehend or repeat. On repeat exam was able to provide reasonable history. Was disoriented to date and place. Cranial Nerves: PERRL 63mm/brisk. EOMI, visual fields full, no facial asymmetry, facial sensation intact, hearing intact, tongue/uvula/soft palate midline, normal sternocleidomastoid and trapezius muscle strength. No evidence of tongue atrophy or fibrillations Motor: symmetrically weak 4/5 bilateral  extremities. Symmetrically weak 2/5 bilateral lower extremities. Repeat exam antigravity on all 4 Tone: is normal and bulk is normal Sensation- Intact to noxious stimulus bilaterally Coordination: unable to perform initially. Later on on repeat exam no dysmetria on finger-nose. Gait- deferred  NIHSS - at presentation 1a Level of Conscious.: 0 1b LOC Questions: 2 1c LOC Commands: 0 2 Best Gaze: 0 3 Visual: 0 4 Facial Palsy: 0 5a Motor Arm - left: 1 5b Motor Arm - Right: 1 6a Motor Leg - Left: 2 6b Motor Leg - Right: 2 7 Limb Ataxia: 0 8 Sensory: 0 9 Best Language: 2 10 Dysarthria: 1 11 Extinct. and Inatten.: 0 TOTAL: 11  Labs I have reviewed labs in epic and the results pertinent to this consultation are:  CBC    Component Value Date/Time   WBC 8.7 03/26/2017 1448   RBC 5.13 03/26/2017 1448   HGB 14.6 03/26/2017 1454   HGB 14.4 06/05/2013 0408   HCT 43.0 03/26/2017 1454   HCT 42.3 06/03/2013 1240   PLT 201 03/26/2017 1448   PLT 154 06/05/2013 0408   MCV 85.4 03/26/2017 1448   MCV 86 06/03/2013 1240   MCH 30.2 03/26/2017 1448   MCHC 35.4 03/26/2017 1448   RDW 13.1 03/26/2017 1448   RDW 13.5 06/03/2013 1240   LYMPHSABS 3.2 03/26/2017 1448   MONOABS 0.7 03/26/2017 1448   EOSABS 0.1 03/26/2017 1448   BASOSABS 0.1 03/26/2017 1448    CMP     Component Value Date/Time   NA 143 03/26/2017 1454   NA 139 06/05/2013 0408   K 3.2 (L) 03/26/2017 1454   K 3.3 (L) 06/05/2013 0408   CL 107 03/26/2017 1454   CL 106 06/05/2013 0408   CO2 22 03/26/2017 1448   CO2 28 06/05/2013 0408   GLUCOSE 201 (H) 03/26/2017 1454   GLUCOSE 109 (H) 06/05/2013 0408   BUN 15 03/26/2017 1454   BUN 20 (H) 06/05/2013 0408   CREATININE 1.40 (H) 03/26/2017 1454   CREATININE 1.21 06/05/2013 0408   CALCIUM 9.2 03/26/2017 1448   CALCIUM 8.9 06/05/2013 0408   PROT 7.8 03/26/2017 1448   PROT 7.3 06/03/2013 1240   ALBUMIN 3.9 03/26/2017 1448   ALBUMIN 3.5 06/03/2013 1240   AST 57 (H)  03/26/2017 1448   AST 29 06/03/2013 1240   ALT 32 03/26/2017 1448   ALT 33 06/03/2013 1240   ALKPHOS 77 03/26/2017 1448   ALKPHOS 86 06/03/2013 1240   BILITOT 2.0 (H) 03/26/2017 1448   BILITOT 1.2 (H) 06/03/2013 1240   GFRNONAA 41 (L) 03/26/2017 1448   GFRNONAA 59 (L) 06/05/2013 0408   GFRAA 47 (L) 03/26/2017 1448   GFRAA >60 06/05/2013 0408    Lipid Panel     Component Value Date/Time   CHOL 134 06/04/2013 0540   TRIG 145 06/04/2013 0540   HDL 25 (L) 06/04/2013 0540   VLDL 29 06/04/2013 0540   LDLCALC 80 06/04/2013 0540  Imaging I have reviewed the images obtained:  CT-scan of the brain - no acute changes  Assessment:  76 year old man with past medical history of dementia diabetes hypertension sleep apnea and multiple episodes of syncope in the past presented for evaluation of an episode of unresponsiveness when he suddenly "passed out" following which he had somefitness left facial weakness as well as generalized weakness. He has been evaluated for cardiogenic syncope in the past and etiology of his syncope has been thought to be cardiogenic according to him. 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 -Cardiac evaluation For syncope and possible abnormal heart rhythm   Neurology will be available as needed. Please call with questions.  Milon Dikes, MD [facility] [phone]  If 7pm to 7am, please call on call as listed on [facility]ION.

## 2017-03-26 NOTE — ED Notes (Addendum)
MD at bedside. Dr. Dorna Bloom to call daughter.

## 2017-03-26 NOTE — ED Notes (Signed)
To mri 

## 2017-03-26 NOTE — ED Provider Notes (Signed)
[facility]Y DEPT Provider Note   CSN: [id number] Arrival date & time: 03/26/17  1445   An emergency department physician performed an initial assessment on this suspected stroke patient at 1447 (pickering).  History   Chief Complaint Chief Complaint  Patient presents with  . Code Stroke    HPI Stanley Nielsen is a 76 y.o. male.  76 year old male history of dementia, diabetes, hypertension, and gout who presents via EMS as a code stroke.  Initially reported left sided weakness.  Patient states he was returning home from the grocery store.  He reports walking up several break steps when he felt weak of his left side.  He cannot call exact details or time of onset.  He states he fell and hurt his left shin.  He injured his house before syncopal rising.  He was found by a caretaker and EMS was called.  Patient denies any focal weakness currently. Patient's daughter lives in [place].  The history is provided by the patient and medical records. No language interpreter was used.    Past Medical History:  Diagnosis Date  . CKD (chronic kidney disease), stage III (HCC)   . Dementia   . Depression   . Gout   . HOH (hard of hearing)   . Hypertension   . Sleep apnea   . Vertigo     Patient Active Problem List   Diagnosis Date Noted  . GERD (gastroesophageal reflux disease) 03/26/2017  . Acute renal failure superimposed on stage 3 chronic kidney disease (HCC) 03/26/2017  . Ischemic stroke (HCC) 03/26/2017  . Acute metabolic encephalopathy 03/26/2017  . 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 Dementia   . Hypertension     Past Surgical History:  Procedure Laterality Date  . CATARACT EXTRACTION W/PHACO Left 04/30/2015   Procedure: CATARACT EXTRACTION PHACO AND INTRAOCULAR LENS PLACEMENT (IOC);  Surgeon: Galen Manila, MD;  Location: [facility] ORS;  Service: Ophthalmology;  Laterality: Left;  [place] 00:47 AP% 23.5 CDE 11.16 fluid pack lot # [id number]  .  COLONOSCOPY         Home Medications    Prior to Admission medications   Medication Sig Start Date End Date Taking? Authorizing Provider  amLODipine (NORVASC) 10 MG tablet Take 10 mg by mouth daily.   Yes [provider]  galantamine (RAZADYNE) 4 MG tablet Take 4 mg by mouth 2 (two) times daily with a meal.   Yes [provider]  losartan (COZAAR) 50 MG tablet Take 50 mg by mouth daily.    Yes [provider]  omeprazole (PRILOSEC) 20 MG capsule Take 20 mg by mouth daily.   Yes [provider]  ondansetron (ZOFRAN ODT) 4 MG disintegrating tablet Take 1 tablet (4 mg total) by mouth every 8 (eight) hours as needed for nausea or vomiting. 06/14/15  Yes Minna Antis, MD  PRESCRIPTION MEDICATION Apply 1 application topically daily as needed (use on his legs for iching).   Yes [provider]    Family History Family History  Problem Relation Age of Onset  . Dementia Mother   . Alcoholism Father     Social History Social History  Substance Use Topics  . Smoking status: Never Smoker  . Smokeless tobacco: Never Used  . Alcohol use Yes     Comment: OCC     Allergies   Penicillins and Sulfa antibiotics   Review of Systems Review of Systems  Constitutional: Negative for chills and fever.  HENT: Negative for  ear pain and sore throat.   Eyes: Negative for pain and visual disturbance.  Respiratory: Negative for cough and shortness of breath.   Cardiovascular: Negative for chest pain and palpitations.  Gastrointestinal: Negative for abdominal pain and vomiting.  Genitourinary: Negative for dysuria and hematuria.  Musculoskeletal: Negative for arthralgias and back pain.  Skin: Positive for wound. Negative for color change and rash.  Neurological: Positive for syncope. Negative for seizures.  All other systems reviewed and are negative.    Physical Exam Updated Vital Signs BP 123/70 (BP Location: Left Arm)   Pulse 87   Temp  98.6 F (37 C) (Oral)   Resp 20   Ht  (1.676 m)   Wt 92.2 kg (203 lb 4.8 oz)   SpO2 98%   BMI 32.81 kg/m   Physical Exam  Constitutional: He appears well-developed.  HENT:  Head: Normocephalic and atraumatic.  Eyes: Conjunctivae are normal.  Neck: Neck supple.  Cardiovascular: Normal rate and regular rhythm.   No murmur heard. Pulmonary/Chest: Effort normal and breath sounds normal. No respiratory distress.  Abdominal: Soft. There is no tenderness.  Musculoskeletal: He exhibits tenderness (Over abrasion site). He exhibits no edema.  Abrasion over L anterior shin  Neurological: He is alert. No cranial nerve deficit. Coordination normal.  Slight LUE pronator drift. No slurred speech. 5/5 BUE motor strength. 2/5 BLE strength  Skin: Skin is warm and dry.  Nursing note and vitals reviewed.    ED Treatments / Results  Labs (all labs ordered are listed, but only abnormal results are displayed) Labs Reviewed  COMPREHENSIVE METABOLIC PANEL - Abnormal; Notable for the following:       Result Value   Glucose, Bld 198 (*)    Creatinine, Ser 1.60 (*)    AST 57 (*)    Total Bilirubin 2.0 (*)    GFR calc non Af Amer 41 (*)    GFR calc Af Amer 47 (*)    All other components within normal limits  URINALYSIS, ROUTINE W REFLEX MICROSCOPIC - Abnormal; Notable for the following:    Specific Gravity, Urine 1.040 (*)    Glucose, UA 50 (*)    Ketones, ur 5 (*)    Protein, ur 100 (*)    Bacteria, UA RARE (*)    Squamous Epithelial / LPF 0-5 (*)    All other components within normal limits  HEMOGLOBIN A1C - Abnormal; Notable for the following:    Hgb A1c MFr Bld 7.1 (*)    All other components within normal limits  LIPID PANEL - Abnormal; Notable for the following:    HDL 32 (*)    LDL Cholesterol 107 (*)    All other components within normal limits  GLUCOSE, CAPILLARY - Abnormal; Notable for the following:    Glucose-Capillary 168 (*)    All other components within normal limits   GLUCOSE, CAPILLARY - Abnormal; Notable for the following:    Glucose-Capillary 274 (*)    All other components within normal limits  CBG MONITORING, ED - Abnormal; Notable for the following:    Glucose-Capillary 186 (*)    All other components within normal limits  I-STAT CHEM 8, ED - Abnormal; Notable for the following:    Potassium 3.2 (*)    Creatinine, Ser 1.40 (*)    Glucose, Bld 201 (*)    Calcium, Ion 1.11 (*)    All other components within normal limits  PROTIME-INR  APTT  CBC  DIFFERENTIAL  AMMONIA  I-STAT  TROPONIN, ED    EKG  EKG Interpretation  Date/Time:  Friday March 26 2017 15:01:55 EDT Ventricular Rate:  64 PR Interval:    QRS Duration: 98 QT Interval:  453 QTC Calculation: 468 R Axis:   7 Text Interpretation:  Sinus rhythm Ventricular premature complex Short PR interval Borderline repolarization abnormality Baseline wander in lead(s) V1 No significant change since last tracing Confirmed by Melene Plan [phone]) on 03/26/2017 5:12:56 PM       Radiology Ct Angio Head W Or Wo Contrast  Result Date: 03/26/2017 CLINICAL DATA:  Stroke. Neurological deficit for greater than 6 hours. EXAM: CT ANGIOGRAPHY HEAD AND NECK TECHNIQUE: Multidetector CT imaging of the head and neck was performed using the standard protocol during bolus administration of intravenous contrast. Multiplanar CT image reconstructions and MIPs were obtained to evaluate the vascular anatomy. Carotid stenosis measurements (when applicable) are obtained utilizing NASCET criteria, using the distal internal carotid diameter as the denominator. CONTRAST:  50 mL Isovue 370 COMPARISON:  Brain MRI 03/26/2017 FINDINGS: CTA NECK FINDINGS Aortic arch: There is no aneurysm or dissection of the visualized ascending aorta or aortic arch. There is a normal variant aortic arch branching pattern with the brachiocephalic and left common carotid arteries sharing a common origin. The visualized proximal subclavian arteries  are normal. Right carotid system: The right common carotid origin is widely patent. There is no common carotid or internal carotid artery dissection or aneurysm. Atherosclerotic calcification at the carotid bifurcation without hemodynamically significant stenosis. Left carotid system: The left common carotid origin is widely patent. There is no common carotid or internal carotid artery dissection or aneurysm. No hemodynamically significant stenosis. Vertebral arteries: The vertebral system is left dominant. Both vertebral artery origins are normal. Both vertebral arteries are normal to their confluence with the basilar artery. Skeleton: There is no bony spinal canal stenosis. No lytic or blastic lesions. Other neck: 9 mm left level IIa lymph node. No pharyngeal or laryngeal abnormality. Normal thyroid. Upper chest: No pneumothorax or pleural effusion. No nodules or masses. Review of the MIP images confirms the above findings CTA HEAD FINDINGS Anterior circulation: --Intracranial internal carotid arteries: Bilateral atherosclerotic calcification of the internal carotid arteries at the skullbase without hemodynamically significant stenosis. --Anterior cerebral arteries: Normal. --Middle cerebral arteries: Multifocal moderate narrowing of the M2 segments of the left middle cerebral artery. Mild narrowing of the right M1 segment and severe narrowing of the right M2 inferior division. --Posterior communicating arteries: Absent bilaterally. Posterior circulation: --Posterior cerebral arteries: Moderate to severe bilateral P2 segment stenosis. --Superior cerebellar arteries: Normal. --Basilar artery: Normal. --Anterior inferior cerebellar arteries: Not clearly visualized, which is not uncommon. --Posterior inferior cerebellar arteries: Normal. Venous sinuses: As permitted by contrast timing, patent. Anatomic variants: None Delayed phase: No parenchymal contrast enhancement. There is periventricular hypoattenuation  compatible with chronic microvascular disease. Review of the MIP images confirms the above findings IMPRESSION: 1. No emergent large vessel occlusion. 2. 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 3. Moderate stenosis of the left MCA M2 segments and moderate to severe bilateral posterior cerebral artery P2 segment stenosis. 4. No dissection, occlusion or stenosis of the major cervical arteries. 5. Bilateral calcific atherosclerosis of the internal carotid arteries at the skullbase without high-grade stenosis. Electronically Signed   By: Deatra Robinson M.D.   On: 03/26/2017 22:21   Ct Angio Neck W And/or Wo Contrast  Result Date: 03/26/2017 CLINICAL DATA:  Stroke. Neurological deficit for greater than 6 hours. EXAM: CT ANGIOGRAPHY HEAD AND  NECK TECHNIQUE: Multidetector CT imaging of the head and neck was performed using the standard protocol during bolus administration of intravenous contrast. Multiplanar CT image reconstructions and MIPs were obtained to evaluate the vascular anatomy. Carotid stenosis measurements (when applicable) are obtained utilizing NASCET criteria, using the distal internal carotid diameter as the denominator. CONTRAST:  50 mL Isovue 370 COMPARISON:  Brain MRI 03/26/2017 FINDINGS: CTA NECK FINDINGS Aortic arch: There is no aneurysm or dissection of the visualized ascending aorta or aortic arch. There is a normal variant aortic arch branching pattern with the brachiocephalic and left common carotid arteries sharing a common origin. The visualized proximal subclavian arteries are normal. Right carotid system: The right common carotid origin is widely patent. There is no common carotid or internal carotid artery dissection or aneurysm. Atherosclerotic calcification at the carotid bifurcation without hemodynamically significant stenosis. Left carotid system: The left common carotid origin is widely patent. There is no common carotid or internal carotid artery dissection or  aneurysm. No hemodynamically significant stenosis. Vertebral arteries: The vertebral system is left dominant. Both vertebral artery origins are normal. Both vertebral arteries are normal to their confluence with the basilar artery. Skeleton: There is no bony spinal canal stenosis. No lytic or blastic lesions. Other neck: 9 mm left level IIa lymph node. No pharyngeal or laryngeal abnormality. Normal thyroid. Upper chest: No pneumothorax or pleural effusion. No nodules or masses. Review of the MIP images confirms the above findings CTA HEAD FINDINGS Anterior circulation: --Intracranial internal carotid arteries: Bilateral atherosclerotic calcification of the internal carotid arteries at the skullbase without hemodynamically significant stenosis. --Anterior cerebral arteries: Normal. --Middle cerebral arteries: Multifocal moderate narrowing of the M2 segments of the left middle cerebral artery. Mild narrowing of the right M1 segment and severe narrowing of the right M2 inferior division. --Posterior communicating arteries: Absent bilaterally. Posterior circulation: --Posterior cerebral arteries: Moderate to severe bilateral P2 segment stenosis. --Superior cerebellar arteries: Normal. --Basilar artery: Normal. --Anterior inferior cerebellar arteries: Not clearly visualized, which is not uncommon. --Posterior inferior cerebellar arteries: Normal. Venous sinuses: As permitted by contrast timing, patent. Anatomic variants: None Delayed phase: No parenchymal contrast enhancement. There is periventricular hypoattenuation compatible with chronic microvascular disease. Review of the MIP images confirms the above findings IMPRESSION: 1. No emergent large vessel occlusion. 2. 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 3. Moderate stenosis of the left MCA M2 segments and moderate to severe bilateral posterior cerebral artery P2 segment stenosis. 4. No dissection, occlusion or stenosis of the major cervical arteries.  5. Bilateral calcific atherosclerosis of the internal carotid arteries at the skullbase without high-grade stenosis. Electronically Signed   By: Deatra Robinson M.D.   On: 03/26/2017 22:21   Mr Brain Wo Contrast  Result Date: 03/26/2017 CLINICAL DATA:  Initial evaluation for acute syncope. EXAM: MRI HEAD WITHOUT CONTRAST TECHNIQUE: Multiplanar, multiecho pulse sequences of the brain and surrounding structures were obtained without intravenous contrast. COMPARISON:  Priors CT from earlier the same day. FINDINGS: Brain: Moderately advance generalized cerebral atrophy. Patchy and confluent T2/FLAIR hyperintensity within the periventricular and deep white matter of both cerebral hemispheres most consistent with chronic small vessel ischemic disease, also moderate nature. There is patchy diffusion abnormality involving the mid and ventral right paramedian pons (series 3, image 15), consistent with acute ischemic perforator type infarct. This measures approximately 14 mm in length. No associated mass effect or hemorrhage. No other evidence for acute or subacute ischemia. Gray-white matter differentiation otherwise maintained. Small remote lacunar infarcts noted within the  right basal ganglia and right thalamus. Remote Single punctate chronic microhemorrhage noted within the ventral right thalamus. No other evidence for acute or chronic intracranial hemorrhage. No mass lesion, midline shift or mass effect. Ventricular prominence related to global parenchymal volume loss without hydrocephalus. No extra-axial fluid collection. Major dural sinuses are grossly patent. Pituitary gland and suprasellar region within normal limits. Midline structures intact and normal. Vascular: Major intracranial vascular flow voids maintained. Skull and upper cervical spine: Craniocervical junction normal. Upper cervical spine within normal limits. Bone marrow signal intensity normal. No scalp soft tissue abnormality. Sinuses/Orbits: Globes  and orbital soft tissues within normal limits. Patient status post lens extraction on the left. Mild mucosal thickening noted within the ethmoidal air cells and maxillary sinuses superimposed retention cysts noted within left maxillary sinus. Paranasal sinuses otherwise clear. No mastoid effusion. Inner ear structures normal. Other: None. IMPRESSION: 1. 14 mm acute ischemic nonhemorrhagic perforator type infarct involving the right paramedian pons. 2. Remote lacunar infarcts involving the right basal ganglia and right thalamus. 3. Moderate cerebral atrophy with chronic small vessel ischemic disease. Electronically Signed   By: Rise  M.D.   On: 03/26/2017 20:09   Ct Head Code Stroke Wo Contrast  Result Date: 03/26/2017 CLINICAL DATA:  Code stroke. Syncopal episode, last seen normal 1345 hours. EXAM: CT HEAD WITHOUT CONTRAST TECHNIQUE: Contiguous axial images were obtained from the base of the skull through the vertex without intravenous contrast. COMPARISON:  None. FINDINGS: Brain: No evidence of acute infarction, hemorrhage, hydrocephalus, extra-axial collection or mass lesion/mass effect. Advanced atrophy. Hypoattenuation of white matter, consistent with chronic microvascular ischemic change. Chronic infarction affecting RIGHT basal ganglia, predominantly caudate and anterior internal capsule. Vascular: Calcification of the cavernous internal carotid arteries consistent with cerebrovascular atherosclerotic disease. No signs of intracranial large vessel occlusion. Skull: Normal. Negative for fracture or focal lesion. Sinuses/Orbits: No acute finding. Other: None. ASPECTS [facility] Stroke Program Early CT Score) - Ganglionic level infarction (caudate, lentiform nuclei, internal capsule, insula, M1-M3 cortex): 7 - Supraganglionic infarction (M4-M6 cortex): 3 Total score (0-10 with 10 being normal): 10 IMPRESSION: 1. Atrophy and small vessel disease. No acute intracranial findings. 2. ASPECTS is 10.  These results were called by telephone at the time of interpretation on 03/26/2017 at 3:03 pm to Dr. Wilford Corner, who verbally acknowledged these results. Electronically Signed   By: Elsie Stain M.D.   On: 03/26/2017 15:06    Procedures Procedures (including critical care time)  Medications Ordered in ED Medications  galantamine (RAZADYNE) tablet 4 mg (4 mg Oral Given 03/27/17 1301)  pantoprazole (PROTONIX) EC tablet 40 mg (40 mg Oral Given 03/27/17 1053)  atorvastatin (LIPITOR) tablet 40 mg (0 mg Oral Hold 03/26/17 2229)  potassium chloride 20 MEQ/15ML (10%) solution 20 mEq (0 mEq Oral Hold 03/26/17 2229)  0.9 %  sodium chloride infusion ( Intravenous New Bag/Given 03/27/17 0028)  insulin aspart (novoLOG) injection 0-5 Units (0 Units Subcutaneous Not Given 03/26/17 2345)  insulin aspart (novoLOG) injection 0-9 Units (5 Units Subcutaneous Given 03/27/17 1307)  acetaminophen (TYLENOL) tablet 650 mg (not administered)    Or  acetaminophen (TYLENOL) solution 650 mg (not administered)    Or  acetaminophen (TYLENOL) suppository 650 mg (not administered)  senna-docusate (Senokot-S) tablet 1 tablet (not administered)  enoxaparin (LOVENOX) injection 40 mg (40 mg Subcutaneous Given 03/27/17 0137)  aspirin suppository 300 mg ( Rectal See Alternative 03/27/17 1053)    Or  aspirin tablet 325 mg (325 mg Oral Given 03/27/17 1053)  ondansetron (ZOFRAN) injection  4 mg (not administered)  zolpidem (AMBIEN) tablet 5 mg (not administered)  hydrALAZINE (APRESOLINE) injection 5 mg (not administered)  Influenza vac split quadrivalent PF (FLUZONE HIGH-DOSE) injection 0.5 mL (not administered)  chlorproMAZINE (THORAZINE) 12.5 mg in sodium chloride 0.9 % 25 mL IVPB (12.5 mg Intravenous New Bag/Given 03/27/17 0624)  sodium chloride 0.9 % bolus 1,000 mL (0 mLs Intravenous Stopped 03/26/17 2315)  iopamidol (ISOVUE-370) 76 % injection (50 mLs  Contrast Given 03/26/17 2137)   stroke: mapping our early stages of recovery book ( Does  not apply Given 03/26/17 2230)     Initial Impression / Assessment and Plan / ED Course  I have reviewed the triage vital signs and the nursing notes.  Pertinent labs & imaging results that were available during my care of the patient were reviewed by me and considered in my medical decision making (see chart for details).     89 yoM h/o dementia, diabetes, hypertension, and gout who presents as a code stroke for LHB weakness after syncopal episode at home. Initially groggy with mild LUE pronator drift. Symmetric BLE weakness on exam. Questionable whether heat may have played role in syncope as he was returning home with groceries in hot weather. VSS.  CT Code stroke showing NAICA. MRI showing 14mm acute ischemic infarct in right paramedian pons. Pt admitted for further stroke workup and evaluation. Pt stable at time of transfer.  Pt care d/w Dr. Adela Lank  Final Clinical Impressions(s) / ED Diagnoses   Final diagnoses:  Secondary hypertension    New Prescriptions Current Discharge Medication List       Hebert Soho, MD 03/27/17 1319    Melene Plan, DO 03/28/17 1451

## 2017-03-26 NOTE — Code Documentation (Signed)
76yo male arriving to [facility] via [facility] at 1445.  Patient from home where he sustained a fall walking up the stairs.  He proceeded to go inside and was noted to be unresponsive in the chair.  EMS called and activated a code stroke for left sided weakness and facial droop.  Stroke team at the bedside on patient arrival.  Labs drawn and patient cleared for CT by Dr. Rubin Payor.  Patient to CT with team.  CT completed.  NIHSS 6, see documentation for details and code stroke times.  Patient with mild left facial weakness on exam.  Patient unable to answer questions with bilateral leg drift and mild dysarthria on exam.  Patient with h/o syncope.  No acute stroke treatment at this time.  Bedside handoff with ED RN Shanda Bumps.

## 2017-03-26 NOTE — ED Notes (Signed)
Admitting MD at bedside.

## 2017-03-26 NOTE — Progress Notes (Signed)
Pt admitted  From [facility] ED from home with Dx stroke admitted to 5 C17.   pt alert awake and oriented to perso place and  Situation  But mixed up with month  And year. Reoriented to month and year. Cardiac monitor   Box 12 in use CCMT notified and verification completed .Pt oriented to staff, room and CB within reach to call for assist at all time. .Vomminted x 1 on arrival to the unit. . Denied H/A any n/v now. RN will continue to monitor pt.

## 2017-03-26 NOTE — ED Notes (Signed)
To CT at this time.

## 2017-03-27 ENCOUNTER — Inpatient Hospital Stay (HOSPITAL_COMMUNITY): Payer: Non-veteran care

## 2017-03-27 ENCOUNTER — Encounter (HOSPITAL_COMMUNITY): Payer: Self-pay | Admitting: *Deleted

## 2017-03-27 DIAGNOSIS — I361 Nonrheumatic tricuspid (valve) insufficiency: Secondary | ICD-10-CM

## 2017-03-27 DIAGNOSIS — G9341 Metabolic encephalopathy: Secondary | ICD-10-CM

## 2017-03-27 LAB — LIPID PANEL
Cholesterol: 159 mg/dL (ref 0–200)
HDL: 32 mg/dL — AB (ref >40)
LDL Cholesterol: 107 mg/dL — ABNORMAL HIGH (ref 0–99)
Total CHOL/HDL Ratio: 5 RATIO
Triglycerides: 100 mg/dL (ref <150)
VLDL: 20 mg/dL (ref 0–40)

## 2017-03-27 LAB — GLUCOSE, CAPILLARY
GLUCOSE-CAPILLARY: 168 mg/dL — AB (ref 65–99)
GLUCOSE-CAPILLARY: 183 mg/dL — AB (ref 65–99)
Glucose-Capillary: 274 mg/dL — ABNORMAL HIGH (ref 65–99)
Glucose-Capillary: 210 mg/dL — ABNORMAL HIGH (ref 65–99)

## 2017-03-27 LAB — ECHOCARDIOGRAM COMPLETE
HEIGHTINCHES: 66 in
WEIGHTICAEL: 3252.8 [oz_av]

## 2017-03-27 LAB — HEMOGLOBIN A1C
HEMOGLOBIN A1C: 7.1 % — AB (ref 4.8–5.6)
MEAN PLASMA GLUCOSE: 157.07 mg/dL

## 2017-03-27 MED ORDER — INFLUENZA VAC SPLIT HIGH-DOSE 0.5 ML IM SUSY
0.5000 mL | PREFILLED_SYRINGE | INTRAMUSCULAR | Status: AC
  Administered 2017-03-28: 0.5 mL via INTRAMUSCULAR
  Filled 2017-03-27: qty 0.5

## 2017-03-27 MED ORDER — SODIUM CHLORIDE 0.9 % IV SOLN
12.5000 mg | Freq: Three times a day (TID) | INTRAVENOUS | Status: DC | PRN
  Administered 2017-03-27: 12.5 mg via INTRAVENOUS
  Filled 2017-03-27: qty 0.5

## 2017-03-27 NOTE — Progress Notes (Signed)
  Echocardiogram 2D Echocardiogram has been performed.  [name] M 03/27/2017, 8:20 AM

## 2017-03-27 NOTE — Progress Notes (Signed)
Inpatient Rehabilitation  Per PT/OT request patient was screened by Fae Pippin for appropriateness for an Inpatient Acute Rehab consult.  At this time note that an order has been placed.  Please plan for an Admission's Coordinator to follow up after the consult has been completed.    Charlane Ferretti., CCC/SLP Admission Coordinator  [facility] Inpatient Rehabilitation  Cell [phone]

## 2017-03-27 NOTE — Progress Notes (Signed)
Pt refuse CPAP for the night.  

## 2017-03-27 NOTE — Progress Notes (Signed)
PROGRESS NOTE    Stanley Nielsen  ZOX:[id number] DOB: August 17, 1940 DOA: 03/26/2017 PCP: Reid, [place], MD   Brief Narrative: Stanley Nielsen is a 76 y.o. male with medical history significant of hypertension, diet-controlled diabetes, GERD, gout, depression, dementia, vertigo, OSA on CPAP, syncope, CKD-3, who presents with fall, weakness in arms and legs. He was found to have an acute stroke.   Assessment & Plan:   Principal Problem:   Ischemic stroke (HCC) Active Problems:   Dementia   Hypertension   GERD (gastroesophageal reflux disease)   Acute renal failure superimposed on stage 3 chronic kidney disease (HCC)   Acute metabolic encephalopathy   Type II diabetes mellitus with renal manifestations (HCC)   Ischemic stroke MRI significant for a 14mm acute ischemic infarct of the right paramedian pons. CTA head/neck significant for severe right MCA M2 segment stenosis and moderate stenosis of left MCA M2. LDL of 107. A1C of 7.1%. Echocardiogram significant for grade 1 diastolic dysfunction. PT/OT recommending CIR -CIR consult -SW consult for SNF -continue aspirin -atorvastatin  daily  Acute kidney injury on CKD III Stable.  Essential hypertension -Holding antihypertensives secondary to acute stroke  Diabetes mellitus A1C of 7.1%. Diet controlled as an outpatient. -SSI while inpatient -will recommend metformin on discharge  Dementia Patient has waxing and waning symptoms. [place] aides that come for three hours per day. Per daughter, she thinks his dementia is worsening and that he may eventually need to be in an ALF. -continue galantamine   DVT prophylaxis: Lovenox Code Status: Full code Family Communication: Daughter on the phone Disposition Plan: Discharge to CIR vs SNF   Consultants:   PM&R  Neurology  Procedures:   Echocardiogram (03/27/17)  Antimicrobials:  None    Subjective: No pain  Objective: Vitals:   03/27/17 0330 03/27/17 0700 03/27/17 0956 03/27/17 1255  BP: (!) 156/90 (!) 146/75 113/87 123/70  Pulse:   78 87  Resp:   20 20  Temp: 98.6 F (37 C) 98.5 F (36.9 C) (!) 97.5 F (36.4 C) 98.6 F (37 C)  TempSrc: Oral Oral Oral Oral  SpO2:  94% 93% 98%  Weight:      Height:        Intake/Output Summary (Last 24 hours) at 03/27/17 1350 Last data filed at 03/27/17 1255  Gross per 24 hour  Intake             1875 ml  Output              560 ml  Net             1315 ml   Filed Weights   03/26/17 1502 03/26/17 2345  Weight: 78 kg (171 lb 15.3 oz) 92.2 kg (203 lb 4.8 oz)    Examination:  General exam: Appears calm and comfortable Respiratory system: Clear to auscultation. Respiratory effort normal. Cardiovascular system: S1 & S2 heard, RRR. No murmurs. Gastrointestinal system: Abdomen is nondistended, soft and nontender. Normal bowel sounds heard. Central nervous system: Alert and oriented. Slightly slurred speech. 5/5 strength bilaterally Extremities: No edema. No calf tenderness Skin: No cyanosis. No rashes Psychiatry: Judgement and insight appear normal. Mood & affect appropriate.     Data Reviewed: I have personally reviewed following labs and imaging studies  CBC:  Recent Labs Lab 03/26/17 1448 03/26/17 1454  WBC 8.7  --   NEUTROABS 4.7  --   HGB 15.5 14.6  HCT 43.8 43.0  MCV 85.4  --  PLT 201  --    Basic Metabolic Panel:  Recent Labs Lab 03/26/17 1448 03/26/17 1454  NA 138 143  K 3.9 3.2*  CL 107 107  CO2 22  --   GLUCOSE 198* 201*  BUN 15 15  CREATININE 1.60* 1.40*  CALCIUM 9.2  --    GFR: Estimated Creatinine Clearance: 48.5 mL/min (A) (by C-G formula based on SCr of 1.4 mg/dL (H)). Liver Function Tests:  Recent Labs Lab 03/26/17 1448  AST 57*  ALT 32  ALKPHOS 77  BILITOT 2.0*  PROT 7.8  ALBUMIN 3.9   No results for input(s): LIPASE, AMYLASE in the last 168 hours.  Recent Labs Lab 03/26/17 1729  AMMONIA 35   Coagulation  Profile:  Recent Labs Lab 03/26/17 1448  INR 1.06   HbA1C:  Recent Labs  03/27/17 0529  HGBA1C 7.1*   CBG:  Recent Labs Lab 03/26/17 2115 03/27/17 0608 03/27/17 1304  GLUCAP 186* 168* 274*   Lipid Profile:  Recent Labs  03/27/17 0529  CHOL 159  HDL 32*  LDLCALC 107*  TRIG 100  CHOLHDL 5.0   Thyroid Function Tests: No results for input(s): TSH, T4TOTAL, FREET4, T3FREE, THYROIDAB in the last 72 hours. Anemia Panel: No results for input(s): VITAMINB12, FOLATE, FERRITIN, TIBC, IRON, RETICCTPCT in the last 72 hours. Sepsis Labs: No results for input(s): PROCALCITON, LATICACIDVEN in the last 168 hours.  No results found for this or any previous visit (from the past 240 hour(s)).       Radiology Studies: Ct Angio Head W Or Wo Contrast  Result Date: 03/26/2017 CLINICAL DATA:  Stroke. Neurological deficit for greater than 6 hours. EXAM: CT ANGIOGRAPHY HEAD AND NECK TECHNIQUE: Multidetector CT imaging of the head and neck was performed using the standard protocol during bolus administration of intravenous contrast. Multiplanar CT image reconstructions and MIPs were obtained to evaluate the vascular anatomy. Carotid stenosis measurements (when applicable) are obtained utilizing NASCET criteria, using the distal internal carotid diameter as the denominator. CONTRAST:  50 mL Isovue 370 COMPARISON:  Brain MRI 03/26/2017 FINDINGS: CTA NECK FINDINGS Aortic arch: There is no aneurysm or dissection of the visualized ascending aorta or aortic arch. There is a normal variant aortic arch branching pattern with the brachiocephalic and left common carotid arteries sharing a common origin. The visualized proximal subclavian arteries are normal. Right carotid system: The right common carotid origin is widely patent. There is no common carotid or internal carotid artery dissection or aneurysm. Atherosclerotic calcification at the carotid bifurcation without hemodynamically significant  stenosis. Left carotid system: The left common carotid origin is widely patent. There is no common carotid or internal carotid artery dissection or aneurysm. No hemodynamically significant stenosis. Vertebral arteries: The vertebral system is left dominant. Both vertebral artery origins are normal. Both vertebral arteries are normal to their confluence with the basilar artery. Skeleton: There is no bony spinal canal stenosis. No lytic or blastic lesions. Other neck: 9 mm left level IIa lymph node. No pharyngeal or laryngeal abnormality. Normal thyroid. Upper chest: No pneumothorax or pleural effusion. No nodules or masses. Review of the MIP images confirms the above findings CTA HEAD FINDINGS Anterior circulation: --Intracranial internal carotid arteries: Bilateral atherosclerotic calcification of the internal carotid arteries at the skullbase without hemodynamically significant stenosis. --Anterior cerebral arteries: Normal. --Middle cerebral arteries: Multifocal moderate narrowing of the M2 segments of the left middle cerebral artery. Mild narrowing of the right M1 segment and severe narrowing of the right M2 inferior division. --Posterior  communicating arteries: Absent bilaterally. Posterior circulation: --Posterior cerebral arteries: Moderate to severe bilateral P2 segment stenosis. --Superior cerebellar arteries: Normal. --Basilar artery: Normal. --Anterior inferior cerebellar arteries: Not clearly visualized, which is not uncommon. --Posterior inferior cerebellar arteries: Normal. Venous sinuses: As permitted by contrast timing, patent. Anatomic variants: None Delayed phase: No parenchymal contrast enhancement. There is periventricular hypoattenuation compatible with chronic microvascular disease. Review of the MIP images confirms the above findings IMPRESSION: 1. No emergent large vessel occlusion. 2. 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 3. Moderate stenosis of the left MCA M2 segments and  moderate to severe bilateral posterior cerebral artery P2 segment stenosis. 4. No dissection, occlusion or stenosis of the major cervical arteries. 5. Bilateral calcific atherosclerosis of the internal carotid arteries at the skullbase without high-grade stenosis. Electronically Signed   By: Deatra Robinson M.D.   On: 03/26/2017 22:21   Ct Angio Neck W And/or Wo Contrast  Result Date: 03/26/2017 CLINICAL DATA:  Stroke. Neurological deficit for greater than 6 hours. EXAM: CT ANGIOGRAPHY HEAD AND NECK TECHNIQUE: Multidetector CT imaging of the head and neck was performed using the standard protocol during bolus administration of intravenous contrast. Multiplanar CT image reconstructions and MIPs were obtained to evaluate the vascular anatomy. Carotid stenosis measurements (when applicable) are obtained utilizing NASCET criteria, using the distal internal carotid diameter as the denominator. CONTRAST:  50 mL Isovue 370 COMPARISON:  Brain MRI 03/26/2017 FINDINGS: CTA NECK FINDINGS Aortic arch: There is no aneurysm or dissection of the visualized ascending aorta or aortic arch. There is a normal variant aortic arch branching pattern with the brachiocephalic and left common carotid arteries sharing a common origin. The visualized proximal subclavian arteries are normal. Right carotid system: The right common carotid origin is widely patent. There is no common carotid or internal carotid artery dissection or aneurysm. Atherosclerotic calcification at the carotid bifurcation without hemodynamically significant stenosis. Left carotid system: The left common carotid origin is widely patent. There is no common carotid or internal carotid artery dissection or aneurysm. No hemodynamically significant stenosis. Vertebral arteries: The vertebral system is left dominant. Both vertebral artery origins are normal. Both vertebral arteries are normal to their confluence with the basilar artery. Skeleton: There is no bony spinal canal  stenosis. No lytic or blastic lesions. Other neck: 9 mm left level IIa lymph node. No pharyngeal or laryngeal abnormality. Normal thyroid. Upper chest: No pneumothorax or pleural effusion. No nodules or masses. Review of the MIP images confirms the above findings CTA HEAD FINDINGS Anterior circulation: --Intracranial internal carotid arteries: Bilateral atherosclerotic calcification of the internal carotid arteries at the skullbase without hemodynamically significant stenosis. --Anterior cerebral arteries: Normal. --Middle cerebral arteries: Multifocal moderate narrowing of the M2 segments of the left middle cerebral artery. Mild narrowing of the right M1 segment and severe narrowing of the right M2 inferior division. --Posterior communicating arteries: Absent bilaterally. Posterior circulation: --Posterior cerebral arteries: Moderate to severe bilateral P2 segment stenosis. --Superior cerebellar arteries: Normal. --Basilar artery: Normal. --Anterior inferior cerebellar arteries: Not clearly visualized, which is not uncommon. --Posterior inferior cerebellar arteries: Normal. Venous sinuses: As permitted by contrast timing, patent. Anatomic variants: None Delayed phase: No parenchymal contrast enhancement. There is periventricular hypoattenuation compatible with chronic microvascular disease. Review of the MIP images confirms the above findings IMPRESSION: 1. No emergent large vessel occlusion. 2. 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 3. Moderate stenosis of the left MCA M2 segments and moderate to severe bilateral posterior cerebral artery P2 segment stenosis.  4. No dissection, occlusion or stenosis of the major cervical arteries. 5. Bilateral calcific atherosclerosis of the internal carotid arteries at the skullbase without high-grade stenosis. Electronically Signed   By: Deatra Robinson M.D.   On: 03/26/2017 22:21   Mr Brain Wo Contrast  Result Date: 03/26/2017 CLINICAL DATA:  Initial  evaluation for acute syncope. EXAM: MRI HEAD WITHOUT CONTRAST TECHNIQUE: Multiplanar, multiecho pulse sequences of the brain and surrounding structures were obtained without intravenous contrast. COMPARISON:  Priors CT from earlier the same day. FINDINGS: Brain: Moderately advance generalized cerebral atrophy. Patchy and confluent T2/FLAIR hyperintensity within the periventricular and deep white matter of both cerebral hemispheres most consistent with chronic small vessel ischemic disease, also moderate nature. There is patchy diffusion abnormality involving the mid and ventral right paramedian pons (series 3, image 15), consistent with acute ischemic perforator type infarct. This measures approximately 14 mm in length. No associated mass effect or hemorrhage. No other evidence for acute or subacute ischemia. Gray-white matter differentiation otherwise maintained. Small remote lacunar infarcts noted within the right basal ganglia and right thalamus. Remote Single punctate chronic microhemorrhage noted within the ventral right thalamus. No other evidence for acute or chronic intracranial hemorrhage. No mass lesion, midline shift or mass effect. Ventricular prominence related to global parenchymal volume loss without hydrocephalus. No extra-axial fluid collection. Major dural sinuses are grossly patent. Pituitary gland and suprasellar region within normal limits. Midline structures intact and normal. Vascular: Major intracranial vascular flow voids maintained. Skull and upper cervical spine: Craniocervical junction normal. Upper cervical spine within normal limits. Bone marrow signal intensity normal. No scalp soft tissue abnormality. Sinuses/Orbits: Globes and orbital soft tissues within normal limits. Patient status post lens extraction on the left. Mild mucosal thickening noted within the ethmoidal air cells and maxillary sinuses superimposed retention cysts noted within left maxillary sinus. Paranasal sinuses  otherwise clear. No mastoid effusion. Inner ear structures normal. Other: None. IMPRESSION: 1. 14 mm acute ischemic nonhemorrhagic perforator type infarct involving the right paramedian pons. 2. Remote lacunar infarcts involving the right basal ganglia and right thalamus. 3. Moderate cerebral atrophy with chronic small vessel ischemic disease. Electronically Signed   By: Rise Mu M.D.   On: 03/26/2017 20:09   Ct Head Code Stroke Wo Contrast  Result Date: 03/26/2017 CLINICAL DATA:  Code stroke. Syncopal episode, last seen normal 1345 hours. EXAM: CT HEAD WITHOUT CONTRAST TECHNIQUE: Contiguous axial images were obtained from the base of the skull through the vertex without intravenous contrast. COMPARISON:  None. FINDINGS: Brain: No evidence of acute infarction, hemorrhage, hydrocephalus, extra-axial collection or mass lesion/mass effect. Advanced atrophy. Hypoattenuation of white matter, consistent with chronic microvascular ischemic change. Chronic infarction affecting RIGHT basal ganglia, predominantly caudate and anterior internal capsule. Vascular: Calcification of the cavernous internal carotid arteries consistent with cerebrovascular atherosclerotic disease. No signs of intracranial large vessel occlusion. Skull: Normal. Negative for fracture or focal lesion. Sinuses/Orbits: No acute finding. Other: None. ASPECTS [facility] Stroke Program Early CT Score) - Ganglionic level infarction (caudate, lentiform nuclei, internal capsule, insula, M1-M3 cortex): 7 - Supraganglionic infarction (M4-M6 cortex): 3 Total score (0-10 with 10 being normal): 10 IMPRESSION: 1. Atrophy and small vessel disease. No acute intracranial findings. 2. ASPECTS is 10. These results were called by telephone at the time of interpretation on 03/26/2017 at 3:03 pm to Dr. Wilford Corner, who verbally acknowledged these results. Electronically Signed   By: Elsie Stain M.D.   On: 03/26/2017 15:06        Scheduled Meds: .  aspirin   300 mg Rectal Daily   Or  . aspirin  325 mg Oral Daily  . atorvastatin  40 mg Oral q1800  . enoxaparin (LOVENOX) injection  40 mg Subcutaneous Q24H  . galantamine  4 mg Oral BID WC  . [START ON 03/28/2017] Influenza vac split quadrivalent PF  0.5 mL Intramuscular Tomorrow-1000  . insulin aspart  0-5 Units Subcutaneous QHS  . insulin aspart  0-9 Units Subcutaneous TID WC  . pantoprazole  40 mg Oral Daily  . potassium chloride  20 mEq Oral Once   Continuous Infusions: . sodium chloride 75 mL/hr at 03/27/17 0028  . chlorproMAZINE (THORAZINE) IV 12.5 mg (03/27/17 0624)     LOS: 1 day     Jacquelin Hawking, MD [facility] 03/27/2017, 1:50 PM Pager: [phone]  If 7PM-7AM, please contact night-coverage [email or web address] Password [email or web address] 03/27/2017, 1:50 PM

## 2017-03-27 NOTE — Evaluation (Signed)
Clinical/Bedside Swallow Evaluation Patient Details  Name: Stanley Nielsen MRN: [id number] Date of Birth: [birth year]  Today's Date: 03/27/2017 Time: SLP Start Time (ACUTE ONLY): 0910 SLP Stop Time (ACUTE ONLY): 0925 SLP Time Calculation (min) (ACUTE ONLY): 15 min  Past Medical History:  Past Medical History:  Diagnosis Date  . CKD (chronic kidney disease), stage III (HCC)   . Dementia   . Depression   . Gout   . HOH (hard of hearing)   . Hypertension   . Sleep apnea   . Vertigo    Past Surgical History:  Past Surgical History:  Procedure Laterality Date  . CATARACT EXTRACTION W/PHACO Left 04/30/2015   Procedure: CATARACT EXTRACTION PHACO AND INTRAOCULAR LENS PLACEMENT (IOC);  Surgeon: Galen Manila, MD;  Location: [facility] ORS;  Service: Ophthalmology;  Laterality: Left;  [place] 00:47 AP% 23.5 CDE 11.16 fluid pack lot # [id number]  . COLONOSCOPY     HPI:  Pt is a 76 y.o. male s/p CVA. 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 PMHx inclusive of HTN, diet-controlled diabetes, GERD, gout, depression, dementia, vertigo, OSA on CPAP, syncope and CKD stage III.   Assessment / Plan / Recommendation Clinical Impression  Patient presents with a mild oral dysphagia resulting in decreased mastication and oral transit of solids, however patient is with poor condition of teeth and missing many teeth upper and lower. No over s/s of aspiration or penetration with thin liquids via cup or straw and no difficulty or s/s aspiration or penetration with puree or regular solids. Suspect esophageal dysphagia based on frequently observed hiccups, patient report and h/o GERD. SLP Visit Diagnosis: Dysphagia, unspecified (R13.10)    Aspiration Risk  No limitations    Diet Recommendation Thin liquid;Regular   Liquid Administration via: Straw Medication Administration: Whole meds with liquid Supervision: Patient able to self  feed;Intermittent supervision to cue for compensatory strategies Compensations: Minimize environmental distractions;Slow rate;Small sips/bites Postural Changes: Seated upright at 90 degrees    Other  Recommendations Oral Care Recommendations: Oral care BID   Follow up Recommendations None      Frequency and Duration   N/A         Prognosis N/A   Swallow Study   General Date of Onset: 03/26/17 HPI: Pt is a 76 y.o. male s/p CVA. 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 PMHx inclusive of HTN, diet-controlled diabetes, GERD, gout, depression, dementia, vertigo, OSA on CPAP, syncope and CKD stage III. Type of Study: Bedside Swallow Evaluation Previous Swallow Assessment: N/A Diet Prior to this Study: NPO Temperature Spikes Noted: No Respiratory Status: Room air History of Recent Intubation: No Behavior/Cognition: Alert;Cooperative;Pleasant mood;Confused Oral Cavity Assessment: Within Functional Limits Oral Care Completed by SLP: No Oral Cavity - Dentition: Poor condition;Missing dentition Vision: Functional for self-feeding Self-Feeding Abilities: Able to feed self Patient Positioning: Upright in bed Baseline Vocal Quality: Normal Volitional Cough: Strong Volitional Swallow: Able to elicit    Oral/Motor/Sensory Function Overall Oral Motor/Sensory Function: Mild impairment Facial ROM: Reduced left Facial Symmetry: Abnormal symmetry left Facial Strength: Within Functional Limits Facial Sensation: Within Functional Limits Lingual ROM: Other (Comment) (reduced elevation) Lingual Symmetry: Within Functional Limits Lingual Strength: Reduced Lingual Sensation: Within Functional Limits Velum: Within Functional Limits Mandible: Within Functional Limits   Ice Chips Ice chips: Not tested   Thin Liquid Thin Liquid: Within functional limits Presentation: Cup;Straw;Self Fed  Nectar Thick Nectar Thick  Liquid: Not tested   Honey Thick Honey Thick Liquid: Not tested   Puree Puree: Within functional limits Presentation: Spoon   Solid   GO   Solid: Impaired Oral Phase Impairments: Impaired mastication       Angela Nevin, MA, CCC-SLP 03/27/17 4:08 PM

## 2017-03-27 NOTE — Evaluation (Signed)
Occupational Therapy Evaluation Patient Details Name: Stanley Nielsen MRN: [id number] DOB: Nov 17, 1940 Today's Date: 03/27/2017    History of Present Illness Pt is a 76 y.o. male s/p CVA. 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 PMHx inclusive of HTN, diet-controlled diabetes, GERD, gout, depression, dementia, vertigo, OSA on CPAP, syncope and CKD stage III.   Clinical Impression   Pt reports he was independent with ADL PTA. Currently pt requires mod assist for functional mobility and LB ADL, min assist for seated UB ADL. Pt presenting with expressive deficits, impaired cognition, LUE weakness, poor balance, and decreased awareness of his deficits impacting his independence and safety with ADL and functional mobility. Recommending CIR level therapies to maximize independence and safety with ADL and functional mobility prior to return home. Pt would benefit from continued skilled OT to address established goals.    Follow Up Recommendations  CIR;Supervision/Assistance - 24 hour    Equipment Recommendations  Other (comment) (TBD at next venue)    Recommendations for Other Services       Precautions / Restrictions Precautions Precautions: Fall Restrictions Weight Bearing Restrictions: No      Mobility Bed Mobility      General bed mobility comments: Pt OOB in chair upon arrival  Transfers Overall transfer level: Needs assistance Equipment used: None Transfers: Sit to/from Stand Sit to Stand: Min assist         General transfer comment: for standing balance    Balance Overall balance assessment: Needs assistance Sitting-balance support: Feet supported;No upper extremity supported Sitting balance-Leahy Scale: Fair  Postural control: Left lateral lean;Posterior lean Standing balance support: No upper extremity supported;During functional activity Standing balance-Leahy Scale: Poor Standing  balance comment: posterior and L lateral lean with static standing and no UE support. Pt able to self correct with cues                        ADL either performed or assessed with clinical judgement   ADL Overall ADL's : Needs assistance/impaired Eating/Feeding: Set up;Sitting Eating/Feeding Details (indicate cue type and reason): Pt set up with breakfast at end of session Grooming: Moderate assistance;Standing;Wash/dry face;Wash/dry Programmer, applications Details (indicate cue type and reason): Mod assist for standing balance with posterior and L lateral lean Upper Body Bathing: Minimal assistance;Sitting   Lower Body Bathing: Moderate assistance;Sit to/from stand   Upper Body Dressing : Minimal assistance;Sitting   Lower Body Dressing: Moderate assistance;Sit to/from stand   Toilet Transfer: Moderate assistance;Ambulation Toilet Transfer Details (indicate cue type and reason): Simulated by sit to stand from chair with functional mobility in room         Functional mobility during ADLs: Moderate assistance       Vision Baseline Vision/History: No visual deficits Patient Visual Report: No change from baseline Vision Assessment?: No apparent visual deficits     Perception     Praxis      Pertinent Vitals/Pain Pain Assessment: No/denies pain     Hand Dominance Right   Extremity/Trunk Assessment Upper Extremity Assessment Upper Extremity Assessment: LUE deficits/detail LUE Deficits / Details: Grossly 4/5. Decreased fine/gross motor coordination LUE Coordination: decreased fine motor;decreased gross motor   Lower Extremity Assessment Lower Extremity Assessment: Defer to PT evaluation    Cervical / Trunk Assessment Cervical / Trunk Assessment: Normal   Communication Communication Communication: Expressive difficulties   Cognition Arousal/Alertness: Awake/alert Behavior During Therapy: WFL for tasks assessed/performed (tearful when  discussing reason for  hospitalization) Overall Cognitive Status: No family/caregiver present to determine baseline cognitive functioning Area of Impairment: Orientation;Attention;Memory;Following commands;Safety/judgement;Awareness;Problem solving                 Orientation Level: Disoriented to;Place;Time;Situation Current Attention Level: Sustained Memory: Decreased short-term memory Following Commands: Follows one step commands with increased time Safety/Judgement: Decreased awareness of deficits;Decreased awareness of safety Awareness: Emergent Problem Solving: Slow processing;Requires verbal cues General Comments: Pt tearful regarding why he was in the hospital. Does appropriately respond to questions and commands with increased time.   General Comments       Exercises     Shoulder Instructions      Home Living Family/patient expects to be discharged to:: Private residence Living Arrangements: Alone;Other (Comment) (HH aide x3hrs/day) Available Help at Discharge: Personal care attendant;Available PRN/intermittently Type of Home: House Home Access: Stairs to enter [employer] of Steps: 4 (approximate; unable to provide accurate number) Entrance Stairs-Rails: Can reach both Home Layout: One level     Bathroom Shower/Tub: Tub/shower unit;Walk-in shower   Bathroom Toilet: Standard Bathroom Accessibility: Yes   Home Equipment: None          Prior Functioning/Environment Level of Independence: Independent                 OT Problem List: Decreased strength;Decreased activity tolerance;Impaired balance (sitting and/or standing);Decreased coordination;Decreased cognition;Decreased safety awareness;Decreased knowledge of use of DME or AE;Obesity;Impaired UE functional use      OT Treatment/Interventions: Self-care/ADL training;Therapeutic exercise;Neuromuscular education;Energy conservation;DME and/or AE instruction;Cognitive remediation/compensation;Therapeutic  activities;Patient/family education;Balance training    OT Goals(Current goals can be found in the care plan section) Acute Rehab OT Goals Patient Stated Goal: return to PLOF OT Goal Formulation: With patient Time For Goal Achievement: 04/10/17 Potential to Achieve Goals: Good ADL Goals Pt Will Perform Grooming: with min guard assist;standing Pt Will Perform Upper Body Bathing: with set-up;with supervision;sitting Pt Will Perform Lower Body Bathing: with min guard assist;sit to/from stand Pt Will Transfer to Toilet: with min guard assist;ambulating;bedside commode Pt Will Perform Toileting - Clothing Manipulation and hygiene: with min guard assist;sit to/from stand  OT Frequency: Min 3X/week   Barriers to D/C:            Co-evaluation              AM-PAC PT "6 Clicks" Daily Activity     Outcome Measure Help from another person eating meals?: A Little Help from another person taking care of personal grooming?: A Lot Help from another person toileting, which includes using toliet, bedpan, or urinal?: A Lot Help from another person bathing (including washing, rinsing, drying)?: A Lot Help from another person to put on and taking off regular upper body clothing?: A Little Help from another person to put on and taking off regular lower body clothing?: A Lot 6 Click Score: 14   End of Session    Activity Tolerance: Patient tolerated treatment well Patient left: in chair;with call bell/phone within reach;with chair alarm set  OT Visit Diagnosis: Unsteadiness on feet (R26.81);Other abnormalities of gait and mobility (R26.89);Muscle weakness (generalized) (M62.81);Cognitive communication deficit (R41.841)                Time: 6962-9528 OT Time Calculation (min): 21 min Charges:  OT General Charges $OT Visit: 1 Visit OT Evaluation $OT Eval Moderate Complexity: 1 Mod G-Codes:     Jayse Hodkinson A. Brett Albino, M.S., OTR/L Pager: [phone]  Gaye Alken 03/27/2017, 11:05 AM

## 2017-03-27 NOTE — Progress Notes (Signed)
Physical Therapy Evaluation Patient Details Name: Stanley Nielsen MRN: [id number] DOB: 16-Apr-1941 Today's Date: 03/27/2017   History of Present Illness  Pt is a 76 y.o. male s/p CVA. 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 PMHx inclusive of HTN, diet-controlled diabetes, GERD, gout, depression, dementia, vertigo, OSA on CPAP, syncope and CKD stage III.  Clinical Impression  Evaluated patient and noted findings below (See PT Problem List). Prior to admission patient was independent with mobility and ADLs. At this time he demonstrates moderate L LE weakness, instability, and poor balance/coordination. Feel patient would tolerate intensive therapies and is a good candidate for CIR. This patient will benefit from intensive skilled PT services to address deficits and maximize functional return to independence. Will continue to see acutely and progress as tolerated.     Follow Up Recommendations CIR    Equipment Recommendations   (TBD)    Recommendations for Other Services Rehab consult     Precautions / Restrictions Precautions Precautions: Fall Restrictions Weight Bearing Restrictions: No      Mobility  Bed Mobility Overal bed mobility: Needs Assistance Bed Mobility: Supine to Sit     Supine to sit: Min assist;HOB elevated     General bed mobility comments: required increased time and effort; use of bed rails and min assist to elevate trunk  Transfers Overall transfer level: Needs assistance Equipment used: None Transfers: Sit to/from Stand Sit to Stand: Min assist         General transfer comment: increased time and effort to achieve. relied on LE support from bed when in standing and need for min assist with gait belt to power up.  Ambulation/Gait Ambulation/Gait assistance: Mod assist Ambulation Distance (Feet): 20 Feet Assistive device:  (IV pole; if had gone further RW  appropriate) Gait Pattern/deviations: Step-to pattern;Decreased step length - left;Decreased stance time - left;Decreased stride length;Trunk flexed;Narrow base of support;Drifts right/left;Decreased dorsiflexion - left Gait velocity: decreased Gait velocity interpretation: Below normal speed for age/gender General Gait Details: slow and unsteady. multiple instances of instability and need for wrap around support on gait belt with mod assist during amb to maintain upright despite bilateral UE support. VCs for upright trunk posture.   Stairs            Wheelchair Mobility    Modified Rankin (Stroke Patients Only) Modified Rankin (Stroke Patients Only) Pre-Morbid Rankin Score: No symptoms Modified Rankin: Moderate disability     Balance Overall balance assessment: Needs assistance Sitting-balance support: Feet supported;No upper extremity supported Sitting balance-Leahy Scale: Fair Sitting balance - Comments: demonstrated left lateral lean during sitting with hands in lap; able to self correct with VCs Postural control: Left lateral lean Standing balance support: During functional activity;Single extremity supported Standing balance-Leahy Scale: Poor Standing balance comment: requires LE support on bed for support in standing             High level balance activites: Turns High Level Balance Comments: demonstrated difficulty and instability with turning. needed Mod A with wrap around support from gait belt to maintain balance             Pertinent Vitals/Pain Pain Assessment: No/denies pain    Home Living Family/patient expects to be discharged to:: Private residence Living Arrangements: Alone;Other (Comment) (home health aid comes to help daily for 3 hrs) Available Help at Discharge: Personal care attendant;Available PRN/intermittently Type of Home: House Home Access: Stairs to enter Entrance Stairs-Rails: Can reach both  Entrance Stairs-Number of Steps: 4  (approximate; unable to provide accurate number) Home Layout: One level Home Equipment: None      Prior Function Level of Independence: Independent               Hand Dominance   Dominant Hand: Right    Extremity/Trunk Assessment   Upper Extremity Assessment Upper Extremity Assessment: Defer to OT evaluation    Lower Extremity Assessment Lower Extremity Assessment: RLE deficits/detail;LLE deficits/detail RLE Deficits / Details: WFL LLE Deficits / Details: knee ext and DF 4-/5 LLE Coordination: decreased fine motor       Communication   Communication: Expressive difficulties (mild difficulty with word finding)  Cognition Arousal/Alertness: Awake/alert Behavior During Therapy: WFL for tasks assessed/performed Overall Cognitive Status: No family/caregiver present to determine baseline cognitive functioning Area of Impairment: Orientation;Memory;Following commands;Safety/judgement;Awareness;Problem solving                 Orientation Level: Disoriented to;Time;Situation   Memory: Decreased short-term memory Following Commands: Follows one step commands with increased time Safety/Judgement: Decreased awareness of deficits;Decreased awareness of safety Awareness: Intellectual Problem Solving: Slow processing;Requires verbal cues;Requires tactile cues General Comments: pmhx of dementia; unsure of baseline function but demonstrates cognitive impairments      General Comments      Exercises     Assessment/Plan    PT Assessment Patient needs continued PT services  PT Problem List Decreased strength;Decreased balance;Decreased mobility;Decreased coordination;Decreased cognition;Decreased knowledge of use of DME;Decreased safety awareness       PT Treatment Interventions DME instruction;Gait training;Stair training;Functional mobility training;Therapeutic activities;Therapeutic exercise;Balance training;Neuromuscular re-education;Patient/family  education;Cognitive remediation    PT Goals (Current goals can be found in the Care Plan section)  Acute Rehab PT Goals Patient Stated Goal: to go home PT Goal Formulation: With patient Time For Goal Achievement: 04/10/17 Potential to Achieve Goals: Good    Frequency Min 3X/week   Barriers to discharge        Co-evaluation               AM-PAC PT "6 Clicks" Daily Activity  Outcome Measure Difficulty turning over in bed (including adjusting bedclothes, sheets and blankets)?: A Lot Difficulty moving from lying on back to sitting on the side of the bed? : A Lot Difficulty sitting down on and standing up from a chair with arms (e.g., wheelchair, bedside commode, etc,.)?: A Lot Help needed moving to and from a bed to chair (including a wheelchair)?: A Lot Help needed walking in hospital room?: A Lot Help needed climbing 3-5 steps with a railing? : Total 6 Click Score: 11    End of Session Equipment Utilized During Treatment: Gait belt Activity Tolerance: Patient tolerated treatment well Patient left: in chair;with chair alarm set;with call bell/phone within reach Nurse Communication: Mobility status PT Visit Diagnosis: Unsteadiness on feet (R26.81);Hemiplegia and hemiparesis Hemiplegia - Right/Left: Left Hemiplegia - dominant/non-dominant: Non-dominant Hemiplegia - caused by: Cerebral infarction    Time: 9147-8295 PT Time Calculation (min) (ACUTE ONLY): 24 min   Charges:   PT Evaluation $PT Eval Moderate Complexity: 1 Mod     PT G CodesMckinley Jewel, SPT [phone] office   [name] 03/27/2017, 10:56 AM

## 2017-03-27 NOTE — Progress Notes (Signed)
Nurse tech attempted to check patient's blood sugar and patient refused. RN went to speak to the patient regarding the importance of checking his blood sugar. RN pleaded with patient to allow [place] to check his blood sugar; Patient is still denying. Patient continues to pull at IV line trying to pull it out. MD page notified. Will continue to monitor.

## 2017-03-27 NOTE — Evaluation (Signed)
Speech Language Pathology Evaluation Patient Details Name: Stanley Nielsen MRN: [id number] DOB: 09/08/1940 Today's Date: 03/27/2017 Time: 0925-0950 SLP Time Calculation (min) (ACUTE ONLY): 25 min  Problem List:  Patient Active Problem List   Diagnosis Date Noted  . GERD (gastroesophageal reflux disease) 03/26/2017  . Acute renal failure superimposed on stage 3 chronic kidney disease (HCC) 03/26/2017  . Ischemic stroke (HCC) 03/26/2017  . Acute metabolic encephalopathy 03/26/2017  . 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 Dementia   . Hypertension    Past Medical History:  Past Medical History:  Diagnosis Date  . CKD (chronic kidney disease), stage III (HCC)   . Dementia   . Depression   . Gout   . HOH (hard of hearing)   . Hypertension   . Sleep apnea   . Vertigo    Past Surgical History:  Past Surgical History:  Procedure Laterality Date  . CATARACT EXTRACTION W/PHACO Left 04/30/2015   Procedure: CATARACT EXTRACTION PHACO AND INTRAOCULAR LENS PLACEMENT (IOC);  Surgeon: Galen Manila, MD;  Location: [facility] ORS;  Service: Ophthalmology;  Laterality: Left;  [place] 00:47 AP% 23.5 CDE 11.16 fluid pack lot # [id number]  . COLONOSCOPY     HPI:  Pt is a 76 y.o. male s/p CVA. 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 PMHx inclusive of HTN, diet-controlled diabetes, GERD, gout, depression, dementia, vertigo, OSA on CPAP, syncope and CKD stage III.   Assessment / Plan / Recommendation Clinical Impression  Patient presents with a moderate cognitive-linguistic impairment and mild dysarthria. He was able to correctly state the month, but required two choice cue for year. He was very perseverative and tangential when talking with clinician, and did not pick up on social cues or follow general turn-taking rule for conversation. He perseverated on saying "I was so hot" when discussing  what happened leading up to this hospitalization and exhibited word-finding errors and circumlocutions during conversation. He did exhibit intermittent, very lucid statements, such as asking SLP, "What is the prognosis for my situation....having a stroke?" and he was able to describe and demonstrate his improvements in leg and arm movement and strength. Patient had poor overall awareness and decreased safety awareness, saying he was going to "sit in the chair over there", but he was not very impulsive.    SLP Assessment  SLP Recommendation/Assessment: Patient needs continued Speech Lanaguage Pathology Services SLP Visit Diagnosis: Dysphagia, unspecified (R13.10)    Follow Up Recommendations  24 hour supervision/assistance;Inpatient Rehab    Frequency and Duration min 2x/week  2 weeks      SLP Evaluation Cognition  Overall Cognitive Status: No family/caregiver present to determine baseline cognitive functioning Arousal/Alertness: Awake/alert Orientation Level: Oriented to person;Disoriented to place;Disoriented to time;Disoriented to situation Attention: Sustained;Selective Sustained Attention: Impaired Sustained Attention Impairment: Verbal complex Selective Attention: Impaired Selective Attention Impairment: Verbal complex;Functional basic;Verbal basic Memory: Impaired Memory Impairment: Retrieval deficit;Decreased recall of new information Awareness: Impaired Awareness Impairment: Intellectual impairment Problem Solving: Impaired Problem Solving Impairment: Verbal basic Executive Function: Organizing;Self Monitoring Organizing: Impaired Organizing Impairment: Verbal basic Self Monitoring: Impaired Self Monitoring Impairment: Verbal basic Behaviors: Perseveration Safety/Judgment: Impaired       Comprehension  Auditory Comprehension Overall Auditory Comprehension: Appears within functional limits for tasks assessed Yes/No Questions: Within Functional Limits Commands: Within  Functional Limits Conversation: Simple Interfering Components: Attention;Processing speed EffectiveTechniques: Extra processing time    Expression Expression Primary Mode of Expression: Verbal Verbal  Expression Overall Verbal Expression: Impaired Initiation: No impairment Repetition: No impairment Naming: No impairment Pragmatics: Impairment Impairments: Topic maintenance;Turn Taking;Interpretation of nonverbal communication Interfering Components: Attention   Oral / Motor  Oral Motor/Sensory Function Overall Oral Motor/Sensory Function: Mild impairment Facial ROM: Reduced left Facial Symmetry: Abnormal symmetry left Facial Strength: Within Functional Limits Facial Sensation: Within Functional Limits Lingual ROM: Other (Comment) Lingual Symmetry: Within Functional Limits Lingual Strength: Reduced Lingual Sensation: Within Functional Limits Velum: Within Functional Limits Mandible: Within Functional Limits   GO                   Angela Nevin, MA, CCC-SLP 03/27/17 4:18 PM

## 2017-03-27 NOTE — Clinical Social Work Note (Signed)
CSW consulted for new SNF, met with pt @ bedside to discuss recommendations. Pt verbalized this is the first time he has heard about rehab recommendation, reports that he has been managing on his own and would like to continue doing so. Pt declining SNF at this time.  Thersea Manfredonia B. Joline Maxcy Clinical Social Work Dept Weekend Social Worker [phone] 11:04 AM

## 2017-03-27 NOTE — Progress Notes (Signed)
STROKE TEAM PROGRESS NOTE   HISTORY OF PRESENT ILLNESS (per record) Stanley Nielsen is a 76 y.o. 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 He did not hit his head. He lives at home with her daughter was out of the country at this time. There is a housekeeper in the house, who witnessed this fall and called EMS. When EMS arrived, he was unable to answer their questions. They noted a left-sided facial droop and left arm weakness. He was brought in as a code stroke. I evaluated him on the emergency room bridgewith the stroke team. He was taken in for a stat CT scan of the head. There was no clinical evidence of large vessel occlusion.  Later on he was more coherent and was able to provide history. He said that he has had these episodes where he feels diaphoretic and after that he passes out. He has been evaluated for cardiogenic syncope.  LKW: 2:30 PM on 03/26/2017 tpa given?: no, low suspicion for stroke - non focal encephalopathic exam Premorbid modified Rankin scale (mRS)2    SUBJECTIVE (INTERVAL HISTORY) Family members present. Dr. Pearlean Brownie discussed the patient's stroke and explained that it was the result of small vessel disease. He also discussed the patient's elevated glucose levels.    OBJECTIVE Temp:  [97.5 F (36.4 C)-98.6 F (37 C)] 97.5 F (36.4 C) (10/06 0956) Pulse Rate:  [63-88] 78 (10/06 0956) Cardiac Rhythm: Normal sinus rhythm (10/06 0735) Resp:  [13-22] 20 (10/06 0956) BP: (109-173)/(61-94) 113/87 (10/06 0956) SpO2:  [93 %-100 %] 93 % (10/06 0956) Weight:  [171 lb 15.3 oz (78 kg)-203 lb 4.8 oz (92.2 kg)] 203 lb 4.8 oz (92.2 kg) (10/05 2345)  CBC:   Recent Labs Lab 03/26/17 1448 03/26/17 1454  WBC 8.7  --   NEUTROABS 4.7  --   HGB 15.5  14.6  HCT 43.8 43.0  MCV 85.4  --   PLT 201  --     Basic Metabolic Panel:   Recent Labs Lab 03/26/17 1448 03/26/17 1454  NA 138 143  K 3.9 3.2*  CL 107 107  CO2 22  --   GLUCOSE 198* 201*  BUN 15 15  CREATININE 1.60* 1.40*  CALCIUM 9.2  --     Lipid Panel:     Component Value Date/Time   CHOL 159 03/27/2017 0529   CHOL 134 06/04/2013 0540   TRIG 100 03/27/2017 0529   TRIG 145 06/04/2013 0540   HDL 32 (L) 03/27/2017 0529   HDL 25 (L) 06/04/2013 0540   CHOLHDL 5.0 03/27/2017 0529   VLDL 20 03/27/2017 0529   VLDL 29 06/04/2013 0540   LDLCALC 107 (H) 03/27/2017 0529   LDLCALC 80 06/04/2013 0540   HgbA1c:  Lab Results  Component Value Date   HGBA1C 7.1 (H) 03/27/2017   Urine Drug Screen: No results found for: LABOPIA, COCAINSCRNUR, LABBENZ, AMPHETMU, THCU, LABBARB  Alcohol Level No results found for: ETH  IMAGING   Ct Angio Head W Or Wo Contrast Ct Angio Neck W And/or Wo Contrast 03/26/2017  IMPRESSION:  1. No emergent large vessel occlusion.  2. 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  3. Moderate stenosis of the left MCA M2 segments and moderate to severe bilateral posterior cerebral artery P2  segment stenosis.  4. No dissection, occlusion or stenosis of the major cervical arteries.  5. Bilateral calcific atherosclerosis of the internal carotid arteries at the skullbase without high-grade stenosis.      Mr Brain Wo Contrast 03/26/2017 IMPRESSION:  1. 14 mm acute ischemic nonhemorrhagic perforator type infarct involving the right paramedian pons.  2. Remote lacunar infarcts involving the right basal ganglia and right thalamus.  3. Moderate cerebral atrophy with chronic small vessel ischemic disease.     Ct Head Code Stroke Wo Contrast 03/26/2017 IMPRESSION:  1. Atrophy and small vessel disease. No acute intracranial findings.  2. ASPECTS is 10.    Transthoracic Echocardiogram  03/27/2017 Study Conclusions - Left ventricle: The  cavity size was normal. There was severe   focal basal hypertrophy of the septum with otherwise moderate   concentric LVH. Systolic function was normal. The estimated   ejection fraction was in the range of 60% to 65%. There was   dynamic obstruction at rest, with a peak velocity of 209 cm/sec   and a peak gradient of 17 mm Hg. Wall motion was normal; there   were no regional wall motion abnormalities. Doppler parameters   are consistent with abnormal left ventricular relaxation (grade 1   diastolic dysfunction). Doppler parameters are consistent with   indeterminate ventricular filling pressure. - Aortic valve: Transvalvular velocity was within the normal range.   There was no stenosis. There was no regurgitation. Valve area   (VTI): 2.08 cm^2. Valve area (Vmax): 1.67 cm^2. Valve area   (Vmean): 1.95 cm^2. - Mitral valve: Transvalvular velocity was within the normal range.   There was no evidence for stenosis. There was no regurgitation. - Right ventricle: The cavity size was normal. Wall thickness was   normal. Systolic function was normal. - Tricuspid valve: There was mild regurgitation. - Pulmonary arteries: Systolic pressure was within the normal   range. PA peak pressure: 22 mm Hg (S).        PHYSICAL EXAM Vitals:   03/27/17 0130 03/27/17 0330 03/27/17 0700 03/27/17 0956  BP: 110/70 (!) 156/90 (!) 146/75 113/87  Pulse: 88   78  Resp: 16   20  Temp: 98.6 F (37 C) 98.6 F (37 C) 98.5 F (36.9 C) (!) 97.5 F (36.4 C)  TempSrc: Oral Oral Oral Oral  SpO2: 95%  94% 93%  Weight:      Height:       Obese elderly Caucasian male not in distress. . Afebrile. Head is nontraumatic. Neck is supple without bruit.    Cardiac exam no murmur or gallop. Lungs are clear to auscultation. Distal pulses are well felt.  Neurological Exam ;  Awake  Alert oriented x 3. Normal speech and language.eye movements full without nystagmus.fundi were not visualized. Vision acuity and fields  appear normal. Hearing is normal. Palatal movements are normal. Face symmetric. Tongue midline. Normal strength except mild LE hip flexor weakness bilaterally but normal tone, reflexes and mild lower limbs incoordination. Normal sensation. Gait deferred.     ASSESSMENT/PLAN Mr. Stanley Nielsen is a 76 y.o. male with history of obstructive sleep apnea, hypertension, dementia, chronic kidney disease, gout, hearing impaired, diabetes mellitus, and multiple syncopal episodes possibly cardiac in origin presenting with a fall and subsequent left-sided facial droop as well as left arm weakness. He did not receive IV t-PA due to a low suspicion for stroke.   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 No acute intracranial findings.   MRI head - 14 mm acute ischemic nonhemorrhagic perforator type infarct involving the right paramedian pons.   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 Moderate to severe bilateral posterior cerebral artery P2 segment stenosis.   Carotid Doppler - CTA neck  2D Echo - EF 60-65%. No cardiac source of emboli identified.  LDL - 107  HgbA1c - 7.1  VTE prophylaxis -  - Lovenox Diet regular Room service appropriate? Yes; Fluid consistency: Thin  No antithrombotic prior to admission, now on aspirin 325 mg daily  Patient counseled to be compliant with his antithrombotic medications  Ongoing aggressive stroke risk factor management  Therapy recommendations: Inpatient rehabilitation recommended  Disposition:  Pending  Hypertension  Stable  Permissive hypertension (OK if < 220/120) but gradually normalize in 5-7 days  Long-term BP goal normotensive  Hyperlipidemia  Home meds: No lipid lowering medications prior to admission  LDL 107, goal < 70  Now on Lipitor 40 mg daily  Continue statin at  discharge  Diabetes  HgbA1c 7.1, goal < 7.0  Uncontrolled  Other Stroke Risk Factors  Advanced age  ETOH use, advised to drink no more than 1 drink per day  Obesity, Body mass index is 32.81 kg/m., recommend weight loss, diet and exercise as appropriate   Hx stroke/TIA - Remote lacunar infarcts involving the right basal ganglia and right thalamus by MRI.  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  Elevated creatinine - 1.60 -> 1.40  Hospital day # 1  Delton See PA-C [facility] Pager [phone] 03/27/2017, 3:30 PM I have personally examined this patient, reviewed notes, independently viewed imaging studies, participated in medical decision making and plan of care.ROS completed by me personally and pertinent positives fully documented  I have made any additions or clarifications directly to the above note.He presented with left sided weakness and ataxia due to small Pontine infarct from small vessel disease with uncontrolled risk factors. Recommend aspirin 325 mg daily and aggressive risk factor modification.I spent 35  minutes in total face-to-face time with the patient, more than 50% of which was spent in counseling and coordination of care, reviewing test results, reviewing medication and discussing or reviewing the diagnosis of  His lacunar stroke  , the prognosis and treatment options.    Delia Heady, MD Medical Director [facility] Pager: [phone] 03/27/2017 3:44 PM   To contact Stroke Continuity provider, please refer to [email or web address]. After hours, contact General Neurology

## 2017-03-27 NOTE — Progress Notes (Signed)
  Echocardiogram 2D Echocardiogram has been performed.  Stanley Nielsen M 03/27/2017, 8:21 AM

## 2017-03-28 DIAGNOSIS — I6381 Other cerebral infarction due to occlusion or stenosis of small artery: Secondary | ICD-10-CM | POA: Diagnosis not present

## 2017-03-28 LAB — BASIC METABOLIC PANEL
ANION GAP: 9 (ref 5–15)
BUN: 14 mg/dL (ref 6–20)
CALCIUM: 8.7 mg/dL — AB (ref 8.9–10.3)
CO2: 24 mmol/L (ref 22–32)
Chloride: 106 mmol/L (ref 101–111)
Creatinine, Ser: 1.11 mg/dL (ref 0.61–1.24)
Glucose, Bld: 152 mg/dL — ABNORMAL HIGH (ref 65–99)
Potassium: 2.8 mmol/L — ABNORMAL LOW (ref 3.5–5.1)
Sodium: 139 mmol/L (ref 135–145)

## 2017-03-28 LAB — GLUCOSE, CAPILLARY
Glucose-Capillary: 147 mg/dL — ABNORMAL HIGH (ref 65–99)
Glucose-Capillary: 170 mg/dL — ABNORMAL HIGH (ref 65–99)
Glucose-Capillary: 172 mg/dL — ABNORMAL HIGH (ref 65–99)
Glucose-Capillary: 168 mg/dL — ABNORMAL HIGH (ref 65–99)

## 2017-03-28 MED ORDER — POTASSIUM CHLORIDE CRYS ER 20 MEQ PO TBCR
40.0000 meq | EXTENDED_RELEASE_TABLET | ORAL | Status: AC
  Administered 2017-03-28 (×2): 40 meq via ORAL
  Filled 2017-03-28 (×2): qty 2

## 2017-03-28 NOTE — Progress Notes (Signed)
STROKE TEAM PROGRESS NOTE   HISTORY OF PRESENT ILLNESS (per record) Stanley Nielsen is a 76 y.o. 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 He did not hit his head. He lives at home with her daughter was out of the country at this time. There is a housekeeper in the house, who witnessed this fall and called EMS. When EMS arrived, he was unable to answer their questions. They noted a left-sided facial droop and left arm weakness. He was brought in as a code stroke. I evaluated him on the emergency room bridgewith the stroke team. He was taken in for a stat CT scan of the head. There was no clinical evidence of large vessel occlusion.  Later on he was more coherent and was able to provide history. He said that he has had these episodes where he feels diaphoretic and after that he passes out. He has been evaluated for cardiogenic syncope.  LKW: 2:30 PM on 03/26/2017 tpa given?: no, low suspicion for stroke - non focal encephalopathic exam Premorbid modified Rankin scale (mRS)2    SUBJECTIVE (INTERVAL HISTORY)  Patient states she'll doing better. Echocardiogram was unremarkable.  OBJECTIVE Temp:  [98.2 F (36.8 C)-98.6 F (37 C)] 98.3 F (36.8 C) (10/07 0900) Pulse Rate:  [65-87] 75 (10/07 0900) Cardiac Rhythm: Normal sinus rhythm (10/07 0709) Resp:  [18-20] 18 (10/07 0900) BP: (123-189)/(61-86) 127/61 (10/07 0900) SpO2:  [98 %-99 %] 99 % (10/07 0900)  CBC:   Recent Labs Lab 03/26/17 1448 03/26/17 1454  WBC 8.7  --   NEUTROABS 4.7  --   HGB 15.5 14.6  HCT 43.8 43.0  MCV 85.4  --   PLT 201  --     Basic Metabolic Panel:   Recent Labs Lab 03/26/17 1448 03/26/17 1454 03/28/17 0529  NA 138 143 139  K 3.9 3.2* 2.8*  CL 107 107 106  CO2 22  --  24   GLUCOSE 198* 201* 152*  BUN CREATININE 1.60* 1.40* 1.11  CALCIUM 9.2  --  8.7*    Lipid Panel:     Component Value Date/Time   CHOL 159 03/27/2017 0529   CHOL 134 06/04/2013 0540   TRIG 100 03/27/2017 0529   TRIG 145 06/04/2013 0540   HDL 32 (L) 03/27/2017 0529   HDL 25 (L) 06/04/2013 0540   CHOLHDL 5.0 03/27/2017 0529   VLDL 20 03/27/2017 0529   VLDL 29 06/04/2013 0540   LDLCALC 107 (H) 03/27/2017 0529   LDLCALC 80 06/04/2013 0540   HgbA1c:  Lab Results  Component Value Date   HGBA1C 7.1 (H) 03/27/2017   Urine Drug Screen: No results found for: LABOPIA, COCAINSCRNUR, LABBENZ, AMPHETMU, THCU, LABBARB  Alcohol Level No results found for: ETH  IMAGING   Ct Angio Head W Or Wo Contrast Ct Angio Neck W And/or Wo Contrast 03/26/2017  IMPRESSION:  1. No emergent large vessel occlusion.  2. 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  3. Moderate stenosis of the left MCA M2 segments and moderate to severe bilateral posterior cerebral artery P2 segment stenosis.  4. No dissection, occlusion or stenosis of the major cervical arteries.  5. Bilateral calcific atherosclerosis of the internal carotid arteries at the skullbase without high-grade stenosis.  Stanley Brain Wo Contrast 03/26/2017 IMPRESSION:  1. 14 mm acute ischemic nonhemorrhagic perforator type infarct involving the right paramedian pons.  2. Remote lacunar infarcts involving the right basal ganglia and right thalamus.  3. Moderate cerebral atrophy with chronic small vessel ischemic disease.     Ct Head Code Stroke Wo Contrast 03/26/2017 IMPRESSION:  1. Atrophy and small vessel disease. No acute intracranial findings.  2. ASPECTS is 10.    Transthoracic Echocardiogram  03/27/2017 Study Conclusions - Left ventricle: The cavity size was normal. There was severe   focal basal hypertrophy of the septum with otherwise moderate   concentric LVH. Systolic function was normal. The  estimated   ejection fraction was in the range of 60% to 65%. There was   dynamic obstruction at rest, with a peak velocity of 209 cm/sec   and a peak gradient of 17 mm Hg. Wall motion was normal; there   were no regional wall motion abnormalities. Doppler parameters   are consistent with abnormal left ventricular relaxation (grade 1   diastolic dysfunction). Doppler parameters are consistent with   indeterminate ventricular filling pressure. - Aortic valve: Transvalvular velocity was within the normal range.   There was no stenosis. There was no regurgitation. Valve area   (VTI): 2.08 cm^2. Valve area (Vmax): 1.67 cm^2. Valve area   (Vmean): 1.95 cm^2. - Mitral valve: Transvalvular velocity was within the normal range.   There was no evidence for stenosis. There was no regurgitation. - Right ventricle: The cavity size was normal. Wall thickness was   normal. Systolic function was normal. - Tricuspid valve: There was mild regurgitation. - Pulmonary arteries: Systolic pressure was within the normal   range. PA peak pressure: 22 mm Hg (S).        PHYSICAL EXAM Vitals:   03/27/17 1255 03/27/17 2100 03/28/17 0522 03/28/17 0900  BP: 123/70 (!) 162/80 (!) 189/86 127/61  Pulse: 87 65 83 75  Resp: Temp: 98.6 F (37 C) 98.2 F (36.8 C) 98.2 F (36.8 C) 98.3 F (36.8 C)  TempSrc: Oral Oral Oral Oral  SpO2: 98% 99% 99% 99%  Weight:      Height:       Obese elderly Caucasian male not in distress. . Afebrile. Head is nontraumatic. Neck is supple without bruit.    Cardiac exam no murmur or gallop. Lungs are clear to auscultation. Distal pulses are well felt.  Neurological Exam ;  Awake  Alert oriented x 3. Normal speech and language.eye movements full without nystagmus.fundi were not visualized. Vision acuity and fields appear normal. Hearing is normal. Palatal movements are normal. Face symmetric. Tongue midline. Normal strength except mild LE hip flexor weakness  bilaterally but normal tone, reflexes and mild lower limbs incoordination. Normal sensation. Gait deferred.     ASSESSMENT/PLAN Stanley. Loreto Nielsen is a 76 y.o. male with history of obstructive sleep apnea, hypertension, dementia, chronic kidney disease, gout, hearing impaired, diabetes mellitus, and multiple syncopal episodes possibly cardiac in origin presenting with a fall and subsequent left-sided facial droop as well as left arm weakness. He did not receive IV t-PA due to a low suspicion for stroke.   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 No acute intracranial findings.   MRI head - 14 mm acute ischemic nonhemorrhagic perforator type infarct involving the right paramedian pons.   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 Moderate to severe bilateral posterior cerebral artery P2 segment stenosis.   Carotid Doppler - CTA neck  2D Echo - EF 60-65%. No cardiac source of emboli identified.  LDL - 107  HgbA1c - 7.1  VTE prophylaxis -  - Lovenox Diet regular Room service appropriate? Yes; Fluid consistency: Thin  No antithrombotic prior to admission, now on aspirin 325 mg daily  Patient counseled to be compliant with his antithrombotic medications  Ongoing aggressive stroke risk factor management  Therapy recommendations: Inpatient rehabilitation recommended  Disposition:  Pending  Hypertension  Stable  Permissive hypertension (OK if < 220/120) but gradually normalize in 5-7 days  Long-term BP goal normotensive  Hyperlipidemia  Home meds: No lipid lowering medications prior to admission  LDL 107, goal < 70  Now on Lipitor 40 mg daily  Continue statin at discharge  Diabetes  HgbA1c 7.1, goal < 7.0  Uncontrolled  Other Stroke Risk Factors  Advanced age  ETOH use, advised to drink no more than 1  drink per day  Obesity, Body mass index is 32.81 kg/m., recommend weight loss, diet and exercise as appropriate   Hx stroke/TIA - Remote lacunar infarcts involving the right basal ganglia and right thalamus by MRI.  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  Elevated creatinine - 1.60 -> 1.40  Hospital day # 2   I have personally examined this patient, reviewed notes, independently viewed imaging studies, participated in medical decision making and plan of care.ROS completed by me personally and pertinent positives fully documented  I have made any additions or clarifications directly to the above note.He presented with left sided weakness and ataxia due to small Pontine infarct from small vessel disease with uncontrolled risk factors. Recommend aspirin 325 mg daily and aggressive risk factor modification continue ongoing therapies. Transfer to rehabilitation when bed available. Follow-up as an outpatient in stroke clinic in 6 weeks. Stroke team will sign off. Kindly call for questions. Discussed with Dr. Caleb Popp.   Delia Heady, MD Medical Director [facility] Pager: [phone] 03/28/2017 12:46 PM   To contact Stroke Continuity provider, please refer to [email or web address]. After hours, contact General Neurology

## 2017-03-28 NOTE — Progress Notes (Signed)
PROGRESS NOTE    Stanley Nielsen  ZOX:[id number] DOB: 1940-07-15 DOA: 03/26/2017 PCP: Reid, [place], MD   Brief Narrative: Stanley Nielsen is a 76 y.o. male with medical history significant of hypertension, diet-controlled diabetes, GERD, gout, depression, dementia, vertigo, OSA on CPAP, syncope, CKD-3, who presents with fall, weakness in arms and legs. He was found to have an acute stroke.   Assessment & Plan:   Principal Problem:   Ischemic stroke (HCC) Active Problems:   Dementia   Hypertension   GERD (gastroesophageal reflux disease)   Acute renal failure superimposed on stage 3 chronic kidney disease (HCC)   Acute metabolic encephalopathy   Type II diabetes mellitus with renal manifestations (HCC)   Ischemic stroke MRI significant for a 14mm acute ischemic infarct of the right paramedian pons. CTA head/neck significant for severe right MCA M2 segment stenosis and moderate stenosis of left MCA M2. LDL of 107. A1C of 7.1%. Echocardiogram significant for grade 1 diastolic dysfunction. PT/OT recommending CIR -CIR consult -SW consult for SNF -continue aspirin -atorvastatin  daily  Acute kidney injury on CKD III Resolved.  Essential hypertension -Holding antihypertensives secondary to acute stroke  Diabetes mellitus A1C of 7.1%. Diet controlled as an outpatient. -SSI while inpatient -will recommend metformin on discharge -diabetes coordinator  Dementia Patient has waxing and waning symptoms. [place] aides that come for three hours per day. Per daughter, she thinks his dementia is worsening and that he may eventually need to be in an ALF. -continue galantamine  Hypokalemia -replete  Diastolic heart dysfunction Grade 1. EF of 60-65%. Euvolemic.   DVT prophylaxis: Lovenox Code Status: Full code Family Communication: Daughter on the phone Disposition Plan: Discharge to CIR vs SNF   Consultants:    PM&R  Neurology  Procedures:   Echocardiogram (03/27/17)  Antimicrobials:  None    Subjective: No complaints.  Objective: Vitals:   03/27/17 0956 03/27/17 1255 03/27/17 2100 03/28/17 0522  BP: 113/87 123/70 (!) 162/80 (!) 189/86  Pulse: 78 87 65 83  Resp: Temp: (!) 97.5 F (36.4 C) 98.6 F (37 C) 98.2 F (36.8 C) 98.2 F (36.8 C)  TempSrc: Oral Oral Oral Oral  SpO2: 93% 98% 99% 99%  Weight:      Height:        Intake/Output Summary (Last 24 hours) at 03/28/17 0933 Last data filed at 03/27/17 1807  Gross per 24 hour  Intake              600 ml  Output                0 ml  Net              600 ml   Filed Weights   03/26/17 1502 03/26/17 2345  Weight: 78 kg (171 lb 15.3 oz) 92.2 kg (203 lb 4.8 oz)    Examination:  General exam: Appears calm and comfortable Respiratory system: Clear to auscultation. Respiratory effort normal. Cardiovascular system: S1 & S2 heard, RRR. No murmurs. Gastrointestinal system: Abdomen is nondistended, soft and nontender. Normal bowel sounds heard. Central nervous system: Alert and oriented to person and place. Slightly slurred speech. 5/5 strength bilaterally Extremities: No edema. No calf tenderness Skin: No cyanosis. No rashes Psychiatry: Judgement and insight appear normal. Mood & affect appropriate.     Data Reviewed: I have personally reviewed following labs and imaging studies  CBC:  Recent Labs Lab 03/26/17 1448 03/26/17  1454  WBC 8.7  --   NEUTROABS 4.7  --   HGB 15.5 14.6  HCT 43.8 43.0  MCV 85.4  --   PLT 201  --    Basic Metabolic Panel:  Recent Labs Lab 03/26/17 1448 03/26/17 1454 03/28/17 0529  NA 138 143 139  K 3.9 3.2* 2.8*  CL 107 107 106  CO2 22  --  24  GLUCOSE 198* 201* 152*  BUN 15 15 14   CREATININE 1.60* 1.40* 1.11  CALCIUM 9.2  --  8.7*   GFR: Estimated Creatinine Clearance: 61.2 mL/min (by C-G formula based on SCr of 1.11 mg/dL). Liver Function Tests:  Recent  Labs Lab 03/26/17 1448  AST 57*  ALT 32  ALKPHOS 77  BILITOT 2.0*  PROT 7.8  ALBUMIN 3.9   No results for input(s): LIPASE, AMYLASE in the last 168 hours.  Recent Labs Lab 03/26/17 1729  AMMONIA 35   Coagulation Profile:  Recent Labs Lab 03/26/17 1448  INR 1.06   HbA1C:  Recent Labs  03/27/17 0529  HGBA1C 7.1*   CBG:  Recent Labs Lab 03/27/17 0608 03/27/17 1304 03/27/17 1614 03/27/17 2103 03/28/17 0616  GLUCAP 168* 274* 210* 183* 147*   Lipid Profile:  Recent Labs  03/27/17 0529  CHOL 159  HDL 32*  LDLCALC 107*  TRIG 100  CHOLHDL 5.0   Thyroid Function Tests: No results for input(s): TSH, T4TOTAL, FREET4, T3FREE, THYROIDAB in the last 72 hours. Anemia Panel: No results for input(s): VITAMINB12, FOLATE, FERRITIN, TIBC, IRON, RETICCTPCT in the last 72 hours. Sepsis Labs: No results for input(s): PROCALCITON, LATICACIDVEN in the last 168 hours.  No results found for this or any previous visit (from the past 240 hour(s)).       Radiology Studies: Ct Angio Head W Or Wo Contrast  Result Date: 03/26/2017 CLINICAL DATA:  Stroke. Neurological deficit for greater than 6 hours. EXAM: CT ANGIOGRAPHY HEAD AND NECK TECHNIQUE: Multidetector CT imaging of the head and neck was performed using the standard protocol during bolus administration of intravenous contrast. Multiplanar CT image reconstructions and MIPs were obtained to evaluate the vascular anatomy. Carotid stenosis measurements (when applicable) are obtained utilizing NASCET criteria, using the distal internal carotid diameter as the denominator. CONTRAST:  50 mL Isovue 370 COMPARISON:  Brain MRI 03/26/2017 FINDINGS: CTA NECK FINDINGS Aortic arch: There is no aneurysm or dissection of the visualized ascending aorta or aortic arch. There is a normal variant aortic arch branching pattern with the brachiocephalic and left common carotid arteries sharing a common origin. The visualized proximal subclavian  arteries are normal. Right carotid system: The right common carotid origin is widely patent. There is no common carotid or internal carotid artery dissection or aneurysm. Atherosclerotic calcification at the carotid bifurcation without hemodynamically significant stenosis. Left carotid system: The left common carotid origin is widely patent. There is no common carotid or internal carotid artery dissection or aneurysm. No hemodynamically significant stenosis. Vertebral arteries: The vertebral system is left dominant. Both vertebral artery origins are normal. Both vertebral arteries are normal to their confluence with the basilar artery. Skeleton: There is no bony spinal canal stenosis. No lytic or blastic lesions. Other neck: 9 mm left level IIa lymph node. No pharyngeal or laryngeal abnormality. Normal thyroid. Upper chest: No pneumothorax or pleural effusion. No nodules or masses. Review of the MIP images confirms the above findings CTA HEAD FINDINGS Anterior circulation: --Intracranial internal carotid arteries: Bilateral atherosclerotic calcification of the internal carotid arteries at the  skullbase without hemodynamically significant stenosis. --Anterior cerebral arteries: Normal. --Middle cerebral arteries: Multifocal moderate narrowing of the M2 segments of the left middle cerebral artery. Mild narrowing of the right M1 segment and severe narrowing of the right M2 inferior division. --Posterior communicating arteries: Absent bilaterally. Posterior circulation: --Posterior cerebral arteries: Moderate to severe bilateral P2 segment stenosis. --Superior cerebellar arteries: Normal. --Basilar artery: Normal. --Anterior inferior cerebellar arteries: Not clearly visualized, which is not uncommon. --Posterior inferior cerebellar arteries: Normal. Venous sinuses: As permitted by contrast timing, patent. Anatomic variants: None Delayed phase: No parenchymal contrast enhancement. There is periventricular hypoattenuation  compatible with chronic microvascular disease. Review of the MIP images confirms the above findings IMPRESSION: 1. No emergent large vessel occlusion. 2. 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 3. Moderate stenosis of the left MCA M2 segments and moderate to severe bilateral posterior cerebral artery P2 segment stenosis. 4. No dissection, occlusion or stenosis of the major cervical arteries. 5. Bilateral calcific atherosclerosis of the internal carotid arteries at the skullbase without high-grade stenosis. Electronically Signed   By: Deatra Robinson M.D.   On: 03/26/2017 22:21   Ct Angio Neck W And/or Wo Contrast  Result Date: 03/26/2017 CLINICAL DATA:  Stroke. Neurological deficit for greater than 6 hours. EXAM: CT ANGIOGRAPHY HEAD AND NECK TECHNIQUE: Multidetector CT imaging of the head and neck was performed using the standard protocol during bolus administration of intravenous contrast. Multiplanar CT image reconstructions and MIPs were obtained to evaluate the vascular anatomy. Carotid stenosis measurements (when applicable) are obtained utilizing NASCET criteria, using the distal internal carotid diameter as the denominator. CONTRAST:  50 mL Isovue 370 COMPARISON:  Brain MRI 03/26/2017 FINDINGS: CTA NECK FINDINGS Aortic arch: There is no aneurysm or dissection of the visualized ascending aorta or aortic arch. There is a normal variant aortic arch branching pattern with the brachiocephalic and left common carotid arteries sharing a common origin. The visualized proximal subclavian arteries are normal. Right carotid system: The right common carotid origin is widely patent. There is no common carotid or internal carotid artery dissection or aneurysm. Atherosclerotic calcification at the carotid bifurcation without hemodynamically significant stenosis. Left carotid system: The left common carotid origin is widely patent. There is no common carotid or internal carotid artery dissection or  aneurysm. No hemodynamically significant stenosis. Vertebral arteries: The vertebral system is left dominant. Both vertebral artery origins are normal. Both vertebral arteries are normal to their confluence with the basilar artery. Skeleton: There is no bony spinal canal stenosis. No lytic or blastic lesions. Other neck: 9 mm left level IIa lymph node. No pharyngeal or laryngeal abnormality. Normal thyroid. Upper chest: No pneumothorax or pleural effusion. No nodules or masses. Review of the MIP images confirms the above findings CTA HEAD FINDINGS Anterior circulation: --Intracranial internal carotid arteries: Bilateral atherosclerotic calcification of the internal carotid arteries at the skullbase without hemodynamically significant stenosis. --Anterior cerebral arteries: Normal. --Middle cerebral arteries: Multifocal moderate narrowing of the M2 segments of the left middle cerebral artery. Mild narrowing of the right M1 segment and severe narrowing of the right M2 inferior division. --Posterior communicating arteries: Absent bilaterally. Posterior circulation: --Posterior cerebral arteries: Moderate to severe bilateral P2 segment stenosis. --Superior cerebellar arteries: Normal. --Basilar artery: Normal. --Anterior inferior cerebellar arteries: Not clearly visualized, which is not uncommon. --Posterior inferior cerebellar arteries: Normal. Venous sinuses: As permitted by contrast timing, patent. Anatomic variants: None Delayed phase: No parenchymal contrast enhancement. There is periventricular hypoattenuation compatible with chronic microvascular disease. Review of the  MIP images confirms the above findings IMPRESSION: 1. No emergent large vessel occlusion. 2. 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 3. Moderate stenosis of the left MCA M2 segments and moderate to severe bilateral posterior cerebral artery P2 segment stenosis. 4. No dissection, occlusion or stenosis of the major cervical arteries.  5. Bilateral calcific atherosclerosis of the internal carotid arteries at the skullbase without high-grade stenosis. Electronically Signed   By: Deatra Robinson M.D.   On: 03/26/2017 22:21   Mr Brain Wo Contrast  Result Date: 03/26/2017 CLINICAL DATA:  Initial evaluation for acute syncope. EXAM: MRI HEAD WITHOUT CONTRAST TECHNIQUE: Multiplanar, multiecho pulse sequences of the brain and surrounding structures were obtained without intravenous contrast. COMPARISON:  Priors CT from earlier the same day. FINDINGS: Brain: Moderately advance generalized cerebral atrophy. Patchy and confluent T2/FLAIR hyperintensity within the periventricular and deep white matter of both cerebral hemispheres most consistent with chronic small vessel ischemic disease, also moderate nature. There is patchy diffusion abnormality involving the mid and ventral right paramedian pons (series 3, image 15), consistent with acute ischemic perforator type infarct. This measures approximately 14 mm in length. No associated mass effect or hemorrhage. No other evidence for acute or subacute ischemia. Gray-white matter differentiation otherwise maintained. Small remote lacunar infarcts noted within the right basal ganglia and right thalamus. Remote Single punctate chronic microhemorrhage noted within the ventral right thalamus. No other evidence for acute or chronic intracranial hemorrhage. No mass lesion, midline shift or mass effect. Ventricular prominence related to global parenchymal volume loss without hydrocephalus. No extra-axial fluid collection. Major dural sinuses are grossly patent. Pituitary gland and suprasellar region within normal limits. Midline structures intact and normal. Vascular: Major intracranial vascular flow voids maintained. Skull and upper cervical spine: Craniocervical junction normal. Upper cervical spine within normal limits. Bone marrow signal intensity normal. No scalp soft tissue abnormality. Sinuses/Orbits: Globes  and orbital soft tissues within normal limits. Patient status post lens extraction on the left. Mild mucosal thickening noted within the ethmoidal air cells and maxillary sinuses superimposed retention cysts noted within left maxillary sinus. Paranasal sinuses otherwise clear. No mastoid effusion. Inner ear structures normal. Other: None. IMPRESSION: 1. 14 mm acute ischemic nonhemorrhagic perforator type infarct involving the right paramedian pons. 2. Remote lacunar infarcts involving the right basal ganglia and right thalamus. 3. Moderate cerebral atrophy with chronic small vessel ischemic disease. Electronically Signed   By: Rise Mu M.D.   On: 03/26/2017 20:09   Ct Head Code Stroke Wo Contrast  Result Date: 03/26/2017 CLINICAL DATA:  Code stroke. Syncopal episode, last seen normal 1345 hours. EXAM: CT HEAD WITHOUT CONTRAST TECHNIQUE: Contiguous axial images were obtained from the base of the skull through the vertex without intravenous contrast. COMPARISON:  None. FINDINGS: Brain: No evidence of acute infarction, hemorrhage, hydrocephalus, extra-axial collection or mass lesion/mass effect. Advanced atrophy. Hypoattenuation of white matter, consistent with chronic microvascular ischemic change. Chronic infarction affecting RIGHT basal ganglia, predominantly caudate and anterior internal capsule. Vascular: Calcification of the cavernous internal carotid arteries consistent with cerebrovascular atherosclerotic disease. No signs of intracranial large vessel occlusion. Skull: Normal. Negative for fracture or focal lesion. Sinuses/Orbits: No acute finding. Other: None. ASPECTS [facility] Stroke Program Early CT Score) - Ganglionic level infarction (caudate, lentiform nuclei, internal capsule, insula, M1-M3 cortex): 7 - Supraganglionic infarction (M4-M6 cortex): 3 Total score (0-10 with 10 being normal): 10 IMPRESSION: 1. Atrophy and small vessel disease. No acute intracranial findings. 2. ASPECTS is 10.  These results were called by  telephone at the time of interpretation on 03/26/2017 at 3:03 pm to Dr. Wilford Corner, who verbally acknowledged these results. Electronically Signed   By: Elsie Stain M.D.   On: 03/26/2017 15:06        Scheduled Meds: . aspirin  300 mg Rectal Daily   Or  . aspirin  325 mg Oral Daily  . atorvastatin  40 mg Oral q1800  . enoxaparin (LOVENOX) injection  40 mg Subcutaneous Q24H  . galantamine  4 mg Oral BID WC  . Influenza vac split quadrivalent PF  0.5 mL Intramuscular Tomorrow-1000  . insulin aspart  0-5 Units Subcutaneous QHS  . insulin aspart  0-9 Units Subcutaneous TID WC  . pantoprazole  40 mg Oral Daily  . potassium chloride  40 mEq Oral Q4H   Continuous Infusions: . chlorproMAZINE (THORAZINE) IV 12.5 mg (03/27/17 0624)     LOS: 2 days     Jacquelin Hawking, MD [facility] 03/28/2017, 9:33 AM Pager: [phone]  If 7PM-7AM, please contact night-coverage [email or web address] Password [email or web address] 03/28/2017, 9:33 AM

## 2017-03-29 DIAGNOSIS — N183 Chronic kidney disease, stage 3 unspecified: Secondary | ICD-10-CM

## 2017-03-29 DIAGNOSIS — I5189 Other ill-defined heart diseases: Secondary | ICD-10-CM

## 2017-03-29 DIAGNOSIS — I10 Essential (primary) hypertension: Secondary | ICD-10-CM

## 2017-03-29 DIAGNOSIS — E876 Hypokalemia: Secondary | ICD-10-CM

## 2017-03-29 DIAGNOSIS — F0390 Unspecified dementia without behavioral disturbance: Secondary | ICD-10-CM

## 2017-03-29 DIAGNOSIS — E119 Type 2 diabetes mellitus without complications: Secondary | ICD-10-CM

## 2017-03-29 DIAGNOSIS — I639 Cerebral infarction, unspecified: Secondary | ICD-10-CM

## 2017-03-29 DIAGNOSIS — I519 Heart disease, unspecified: Secondary | ICD-10-CM

## 2017-03-29 DIAGNOSIS — G4730 Sleep apnea, unspecified: Secondary | ICD-10-CM

## 2017-03-29 DIAGNOSIS — G4733 Obstructive sleep apnea (adult) (pediatric): Secondary | ICD-10-CM

## 2017-03-29 LAB — GLUCOSE, CAPILLARY
GLUCOSE-CAPILLARY: 157 mg/dL — AB (ref 65–99)
GLUCOSE-CAPILLARY: 194 mg/dL — AB (ref 65–99)
Glucose-Capillary: 137 mg/dL — ABNORMAL HIGH (ref 65–99)
Glucose-Capillary: 177 mg/dL — ABNORMAL HIGH (ref 65–99)

## 2017-03-29 NOTE — Consult Note (Signed)
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 Per chart review, patient lives alone independently prior to admission. He has a home health aide 3 hours a day. One level home for steps to entry. Presented 03/26/2017 a left-sided weakness, speech difficulty as well as a fall while climbing up some stairs. He did not strike his head. Cranial CT scan was unremarkable for acute process, but showed atrophy and small vessel disease. Patient did not receive TPA. MRI reviewed showing right pons infarct. 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 Remote lacunar infarctions involving the right basal ganglia and right thalamus. CT angiogram head and neck showed no emergent large vessel occlusion. 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 Echocardiogram with ejection fraction of 65% grade 1 diastolic dysfunction. Neurology consultant maintained on aspirin for CVA prophylaxis. Subcutaneous Lovenox for DVT prophylaxis. Tolerating a regular diet. Occupational therapy evaluation completed with recommendations of physical medicine rehabilitation consult.   Review of Systems  Constitutional: Negative for chills and fever.  HENT: Negative for hearing loss.   Eyes: Negative for blurred vision and double vision.  Respiratory: Negative for cough and shortness of breath.   Cardiovascular: Negative for chest pain, palpitations and leg swelling.  Gastrointestinal: Positive for constipation. Negative for nausea and vomiting.  Genitourinary: Negative for dysuria, flank pain and hematuria.  Skin: Negative for rash.  Neurological: Positive for dizziness  and weakness.       Syncope  Psychiatric/Behavioral: Positive for depression.       Dementia  All other systems reviewed and are negative.  Past Medical History:  Diagnosis Date  . CKD (chronic kidney disease), stage III (HCC)   . Dementia   . Depression   . Gout   . HOH (hard of hearing)   . Hypertension   . Sleep apnea   . Vertigo    Past Surgical History:  Procedure Laterality Date  . CATARACT EXTRACTION W/PHACO Left 04/30/2015   Procedure: CATARACT EXTRACTION PHACO AND INTRAOCULAR LENS PLACEMENT (IOC);  Surgeon: Galen Manila, MD;  Location: [facility] ORS;  Service: Ophthalmology;  Laterality: Left;  [place] 00:47 AP% 23.5 CDE 11.16 fluid pack lot # [id number]  . COLONOSCOPY     Family History  Problem Relation Age of Onset  . Dementia Mother   . Alcoholism Father    Social History:  reports that he has never smoked. He has never used smokeless tobacco. He reports that he drinks alcohol. He reports that he does not use drugs. Allergies:  Allergies  Allergen Reactions  . Penicillins Hives    Has patient had a PCN reaction causing immediate rash, facial/tongue/throat swelling, SOB or lightheadedness with hypotension: No Has patient had a PCN reaction causing severe rash involving mucus membranes or skin necrosis: No Has patient had a PCN reaction that required hospitalization: No Has patient had a PCN reaction occurring within the last 10 years: No If all of the above answers are "NO", then may proceed with Cephalosporin use.  . Sulfa Antibiotics Nausea And Vomiting   Medications Prior to Admission  Medication Sig Dispense Refill  . amLODipine (NORVASC) 10 MG tablet Take 10 mg by mouth daily.    Marland Kitchen galantamine (RAZADYNE) 4  MG tablet Take 4 mg by mouth 2 (two) times daily with a meal.    . losartan (COZAAR) 50 MG tablet Take 50 mg by mouth daily.     Marland Kitchen omeprazole (PRILOSEC) 20 MG capsule Take 20 mg by mouth daily.    . ondansetron (ZOFRAN ODT) 4 MG disintegrating tablet Take 1  tablet (4 mg total) by mouth every 8 (eight) hours as needed for nausea or vomiting. 20 tablet 0  . PRESCRIPTION MEDICATION Apply 1 application topically daily as needed (use on his legs for iching).      Home: Home Living Family/patient expects to be discharged to:: Private residence Living Arrangements: Alone, Other (Comment) (HH aide x3hrs/day) Available Help at Discharge: Personal care attendant, Available PRN/intermittently Type of Home: House Home Access: Stairs to enter [employer] of Steps: 4 (approximate; unable to provide accurate number) Entrance Stairs-Rails: Can reach both Home Layout: One level Bathroom Shower/Tub: Tub/shower unit, Health visitor: Standard Bathroom Accessibility: Yes Home Equipment: None  Functional History: Prior Function Level of Independence: Independent Functional Status:  Mobility: Bed Mobility Overal bed mobility: Needs Assistance Bed Mobility: Supine to Sit Supine to sit: Min assist, HOB elevated General bed mobility comments: Pt OOB in chair upon arrival Transfers Overall transfer level: Needs assistance Equipment used: None Transfers: Sit to/from Stand Sit to Stand: Min assist General transfer comment: for standing balance Ambulation/Gait Ambulation/Gait assistance: Mod assist Ambulation Distance (Feet): 20 Feet Assistive device:  (IV pole; if had gone further RW appropriate) Gait Pattern/deviations: Step-to pattern, Decreased step length - left, Decreased stance time - left, Decreased stride length, Trunk flexed, Narrow base of support, Drifts right/left, Decreased dorsiflexion - left General Gait Details: slow and unsteady. multiple instances of instability and need for wrap around support on gait belt with mod assist during amb to maintain upright despite bilateral UE support. VCs for upright trunk posture.  Gait velocity: decreased Gait velocity interpretation: Below normal speed for age/gender     ADL: ADL Overall ADL's : Needs assistance/impaired Eating/Feeding: Set up, Sitting Eating/Feeding Details (indicate cue type and reason): Pt set up with breakfast at end of session Grooming: Moderate assistance, Standing, Wash/dry face, Wash/dry hands Grooming Details (indicate cue type and reason): Mod assist for standing balance with posterior and L lateral lean Upper Body Bathing: Minimal assistance, Sitting Lower Body Bathing: Moderate assistance, Sit to/from stand Upper Body Dressing : Minimal assistance, Sitting Lower Body Dressing: Moderate assistance, Sit to/from stand Toilet Transfer: Moderate assistance, Ambulation Toilet Transfer Details (indicate cue type and reason): Simulated by sit to stand from chair with functional mobility in room Functional mobility during ADLs: Moderate assistance  Cognition: Cognition Overall Cognitive Status: No family/caregiver present to determine baseline cognitive functioning Arousal/Alertness: Awake/alert Orientation Level: Oriented to person, Oriented to place, Oriented to situation Attention: Sustained, Selective Sustained Attention: Impaired Sustained Attention Impairment: Verbal complex Selective Attention: Impaired Selective Attention Impairment: Verbal complex, Functional basic, Verbal basic Memory: Impaired Memory Impairment: Retrieval deficit, Decreased recall of new information Awareness: Impaired Awareness Impairment: Intellectual impairment Problem Solving: Impaired Problem Solving Impairment: Verbal basic Executive Function: Organizing, Self Monitoring Organizing: Impaired Organizing Impairment: Verbal basic Self Monitoring: Impaired Self Monitoring Impairment: Verbal basic Behaviors: Perseveration Safety/Judgment: Impaired Cognition Arousal/Alertness: Awake/alert Behavior During Therapy: WFL for tasks assessed/performed (tearful when discussing reason for hospitalization) Overall Cognitive Status: No  family/caregiver present to determine baseline cognitive functioning Area of Impairment: Orientation, Attention, Memory, Following commands, Safety/judgement, Awareness, Problem solving Orientation Level: Disoriented to, Place, Time, Situation Current Attention  Level: Sustained Memory: Decreased short-term memory Following Commands: Follows one step commands with increased time Safety/Judgement: Decreased awareness of deficits, Decreased awareness of safety Awareness: Emergent Problem Solving: Slow processing, Requires verbal cues General Comments: Pt tearful regarding why he was in the hospital. Does appropriately respond to questions and commands with increased time.  Blood pressure (!) 160/83, pulse 79, temperature 98.4 F (36.9 C), temperature source Oral, resp. rate 18, height  (1.676 m), weight 92.2 kg (203 lb 4.8 oz), SpO2 100 %. Physical Exam  Vitals reviewed. Constitutional: He appears well-developed and well-nourished.  HENT:  Head: Normocephalic and atraumatic.  Eyes: EOM are normal. Right eye exhibits no discharge. Left eye exhibits no discharge.  Neck: Normal range of motion. Neck supple. No thyromegaly present.  Cardiovascular: Normal rate, regular rhythm and normal heart sounds.   Respiratory: Effort normal and breath sounds normal. No respiratory distress.  GI: Soft. Bowel sounds are normal. He exhibits no distension.  Musculoskeletal: He exhibits no edema or tenderness.  Neurological: He is alert.  Makes eye contact with examiner.  He did follow simple commands.  Limited medical historian. He was not able to give his appropriate age or date of birth. Motor: 4+/5 throughout  Skin: Skin is warm and dry.  LLE with dressing c/d/i  Psychiatric: He has a normal mood and affect. His behavior is normal.    Results for orders placed or performed during the hospital encounter of 03/26/17 (from the past 24 hour(s))  Glucose, capillary     Status: Abnormal   Collection  Time: 03/28/17  6:16 AM  Result Value Ref Range   Glucose-Capillary 147 (H) 65 - 99 mg/dL   Comment 1 Notify RN    Comment 2 Document in Chart   Glucose, capillary     Status: Abnormal   Collection Time: 03/28/17 11:43 AM  Result Value Ref Range   Glucose-Capillary 170 (H) 65 - 99 mg/dL   Comment 1 Notify RN    Comment 2 Document in Chart   Glucose, capillary     Status: Abnormal   Collection Time: 03/28/17  4:37 PM  Result Value Ref Range   Glucose-Capillary 172 (H) 65 - 99 mg/dL   Comment 1 Notify RN    Comment 2 Document in Chart   Glucose, capillary     Status: Abnormal   Collection Time: 03/28/17  9:11 PM  Result Value Ref Range   Glucose-Capillary 168 (H) 65 - 99 mg/dL   Comment 1 Notify RN    Comment 2 Document in Chart    No results found.  Assessment/Plan: Diagnosis: Right paramedian pons infarct Labs and images independently reviewed.  Records reviewed and summated above. Stroke: Continue secondary stroke prophylaxis and Risk Factor Modification listed below:   Antiplatelet therapy:   Blood Pressure Management:  Continue current medication with prn's with permisive HTN per primary team Statin Agent:   Diabetes management:    1. Does the need for close, 24 hr/day medical supervision in concert with the patient's rehab needs make it unreasonable for this patient to be served in a less intensive setting? Potentially 2. Co-Morbidities requiring supervision/potential complications: dementia, diabetes mellitus (Monitor in accordance with exercise and adjust meds as necessary), CKD stage III (avoid nephrotoxic meds), HTN (monitor and provide prns in accordance with increased physical exertion and pain), sleep apnea (CPAP, monitor for daytime somnolence), multiple syncopal episodes, diastolic dysfunction (monitor for signs/symptoms of fluid overload), hypokalemia (continue to monitor and replete as necessary) 3. Due to  safety, medication administration and patient education,  does the patient require 24 hr/day rehab nursing? Yes 4. Does the patient require coordinated care of a physician, rehab nurse, PT (1-2 hrs/day, 5 days/week) and OT (1-2 hrs/day, 5 days/week) to address physical and functional deficits in the context of the above medical diagnosis(es)? Yes Addressing deficits in the following areas: balance, endurance, locomotion, transferring, bathing, dressing, toileting and psychosocial support 5. Can the patient actively participate in an intensive therapy program of at least 3 hrs of therapy per day at least 5 days per week? Yes 6. The potential for patient to make measurable gains while on inpatient rehab is excellent 7. Anticipated functional outcomes upon discharge from inpatient rehab are supervision and min assist  with PT, supervision and min assist with OT, n/a with SLP. 8. Estimated rehab length of stay to reach the above functional goals is: 7-12 days. 9. Anticipated D/C setting: Other 10. Anticipated post D/C treatments: HH therapy and Home excercise program 11. Overall Rehab/Functional Prognosis: good  RECOMMENDATIONS: This patient's condition is appropriate for continued rehabilitative care in the following setting: CIR, however, patient would like to discussion options and costs with his daughter and case Production designer, theatre/television/film. Patient has agreed to participate in recommended program. Potentially Note that insurance prior authorization may be required for reimbursement for recommended care.  Comment: Rehab Admissions Coordinator to follow up.  Maryla Morrow, MD, ABPMR Mariam Dollar J., PA-C 03/29/2017

## 2017-03-29 NOTE — Progress Notes (Signed)
Inpatient Rehabilitation  Met with patient to discuss team's recommendation for IP Rehab.  Shared booklets, insurance verification letter, and answered questions.  [facility] records patient is not enrolled in Medicare and has VA benefits.  Patient reports that he doesn't want to pay for rehab and would like to go through his VA benefit.  Discussed case with nurse case manager.  Will sign off at this time.  Call if questions.    Carmelia Roller., CCC/SLP Admission Coordinator  [facility]  Cell [phone]

## 2017-03-29 NOTE — Progress Notes (Signed)
Physical Therapy Treatment Patient Details Name: Stanley Nielsen MRN: [id number] DOB: 01-06-1941 Today's Date: 03/29/2017    History of Present Illness Pt is a 76 y.o. male s/p CVA. 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 PMHx inclusive of HTN, diet-controlled diabetes, GERD, gout, depression, dementia, vertigo, OSA on CPAP, syncope and CKD stage III.    PT Comments    Patient presented impulsive and inappropriate today, but demonstrates small improvement from previous session. Able to ambulate greater distance today still with marked instability especially when challenged with higher level balance activities. Requires min to mod wrap around assist to correct for instability. Patient staggers and drifts to left and right during and requires VCs to take appropriate steps and not drag feet during walking. Current plan for CIR remains appropriate to maximize functional mobility gains. PT will continue to see acutely and progress as tolerated.   Follow Up Recommendations  CIR     Equipment Recommendations   (TBD)    Recommendations for Other Services Rehab consult     Precautions / Restrictions Precautions Precautions: Fall Restrictions Weight Bearing Restrictions: No    Mobility  Bed Mobility Overal bed mobility: Needs Assistance Bed Mobility: Supine to Sit     Supine to sit: Supervision     General bed mobility comments: increased time and effort to complete; use of bed rails. Supervision for safety   Transfers Overall transfer level: Needs assistance Equipment used: None Transfers: Sit to/from Stand Sit to Stand: Min assist         General transfer comment: increased time and effort to achieve. min assist for balance once standing  Ambulation/Gait Ambulation/Gait assistance: Min assist;Mod assist Ambulation Distance (Feet): 70 Feet (2 bouts of 70') Assistive device: None Gait  Pattern/deviations: Step-through pattern;Decreased dorsiflexion - right;Decreased dorsiflexion - left;Decreased stride length;Drifts right/left;Staggering right;Staggering left     General Gait Details: multipe instances of instability needing min to mod wrap around A intermittently to correct. VCs for heel strike and toe off to prevent dragging his feet. staggers and drifts to the left more than right but to both sides is evident   Careers information officer    Modified Rankin (Stroke Patients Only) Modified Rankin (Stroke Patients Only) Pre-Morbid Rankin Score: No symptoms Modified Rankin: Moderately severe disability     Balance Overall balance assessment: Needs assistance Sitting-balance support: Feet supported;No upper extremity supported Sitting balance-Leahy Scale: Fair Sitting balance - Comments: L lateral lean not significant today; did not challenge sitting balance significantly Postural control: Posterior lean Standing balance support: During functional activity;No upper extremity supported Standing balance-Leahy Scale: Fair               High level balance activites: Turns;Head turns;Sudden stops;Direction changes (heel toe walking in straight line) High Level Balance Comments: demonstrated difficulty with challenges noted several instances of instability requiring min to mod wrap around assist to correct. Head turn to R and patient walked into wall            Cognition Arousal/Alertness: Awake/alert Behavior During Therapy: Impulsive Overall Cognitive Status: No family/caregiver present to determine baseline cognitive functioning Area of Impairment: Orientation;Attention;Memory;Following commands;Safety/judgement;Awareness;Problem solving                 Orientation Level: Disoriented to;Time;Situation Current Attention Level: Sustained Memory: Decreased short-term memory Following Commands: Follows one step commands  consistently Safety/Judgement: Decreased awareness of  deficits;Decreased awareness of safety Awareness: Emergent Problem Solving: Slow processing;Requires verbal cues General Comments: pt impulsive and inappropriate during treatment; easily distracted by people entering environment; cognitive impairments evident and unable to recall seeing PT previously      Exercises      General Comments General comments (skin integrity, edema, etc.): Vitals in bed before mobility (BP 162/89, HR 70 O2 97% on RA). Vitals in chair after amb (BP 178/95 HR 104 O2 99% on RA)      Pertinent Vitals/Pain Pain Assessment: No/denies pain    Home Living                      Prior Function            PT Goals (current goals can now be found in the care plan section) Acute Rehab PT Goals Patient Stated Goal: return to PLOF PT Goal Formulation: With patient Time For Goal Achievement: 04/10/17 Potential to Achieve Goals: Good Progress towards PT goals: Progressing toward goals    Frequency    Min 3X/week      PT Plan Current plan remains appropriate    Co-evaluation              AM-PAC PT "6 Clicks" Daily Activity  Outcome Measure  Difficulty turning over in bed (including adjusting bedclothes, sheets and blankets)?: A Little Difficulty moving from lying on back to sitting on the side of the bed? : A Little Difficulty sitting down on and standing up from a chair with arms (e.g., wheelchair, bedside commode, etc,.)?: A Lot Help needed moving to and from a bed to chair (including a wheelchair)?: A Lot Help needed walking in hospital room?: A Lot Help needed climbing 3-5 steps with a railing? : Total 6 Click Score: 13    End of Session Equipment Utilized During Treatment: Gait belt Activity Tolerance: Patient tolerated treatment well Patient left: in chair (with OT) Nurse Communication: Mobility status PT Visit Diagnosis: Unsteadiness on feet (R26.81);Hemiplegia and  hemiparesis Hemiplegia - Right/Left: Left Hemiplegia - dominant/non-dominant: Non-dominant Hemiplegia - caused by: Cerebral infarction     Time: 1610-9604 PT Time Calculation (min) (ACUTE ONLY): 22 min  Charges:  $Gait Training: 8-22 mins                    G CodesMckinley Jewel, SPT [phone] office    [name] 03/29/2017, 11:57 AM

## 2017-03-29 NOTE — Progress Notes (Signed)
Occupational Therapy Treatment Patient Details Name: Stanley Nielsen MRN: [id number] DOB: [birth year] Today's Date: 03/29/2017    History of present illness Pt is a 76 y.o. male s/p CVA. 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 PMHx inclusive of HTN, diet-controlled diabetes, GERD, gout, depression, dementia, vertigo, OSA on CPAP, syncope and CKD stage III.   OT comments  Pt progressing towards established goals. Pt performed grooming at sink with Min Guard A for safety and Mod VCs to increase and sustain attention to task. Continue to recommend dc to post-acute rehab to optimize safety and independence with ADLs and IADLs. Will continues to follow to facilitate safe dc.    Follow Up Recommendations  CIR;Supervision/Assistance - 24 hour    Equipment Recommendations  Other (comment) (TBD at next venue)    Recommendations for Other Services      Precautions / Restrictions Precautions Precautions: Fall Restrictions Weight Bearing Restrictions: No       Mobility Bed Mobility Overal bed mobility: Needs Assistance Bed Mobility: Supine to Sit     Supine to sit: Supervision     General bed mobility comments: increased time and effort to complete; use of bed rails. Supervision for safety   Transfers Overall transfer level: Needs assistance Equipment used: None Transfers: Sit to/from Stand Sit to Stand: Min guard         General transfer comment: Min Guard for safety    Balance Overall balance assessment: Needs assistance Sitting-balance support: Feet supported;No upper extremity supported Sitting balance-Leahy Scale: Fair Sitting balance - Comments: L lateral lean not significant today; did not challenge sitting balance significantly Postural control: Posterior lean Standing balance support: During functional activity;No upper extremity supported Standing balance-Leahy Scale: Fair Standing  balance comment: posterior and L lateral lean with static standing and no UE support. Pt able to self correct with cues             High level balance activites: Turns;Head turns;Sudden stops;Direction changes (heel toe walking in straight line) High Level Balance Comments: demonstrated difficulty with challenges noted several instances of instability requiring min to mod wrap around assist to correct. Head turn to R and patient walked into wall           ADL either performed or assessed with clinical judgement   ADL Overall ADL's : Needs assistance/impaired     Grooming: Standing;Min guard;Oral care Grooming Details (indicate cue type and reason): Min Guard for safety. VCs to initate tasks and to return to task when distracted                   Toilet Transfer Details (indicate cue type and reason): Min Guard A for safety during urination Toileting- Clothing Manipulation and Hygiene: Min guard;Sit to/from stand   Tub/ Shower Transfer: Walk-in shower;Min guard;Ambulation;Grab bars   Functional mobility during ADLs: Min guard;Cueing for safety General ADL Comments: Pt continues to require cues throughout session for sequencing, awarness, and safety.      Vision   Vision Assessment?: No apparent visual deficits   Perception     Praxis      Cognition Arousal/Alertness: Awake/alert Behavior During Therapy: Impulsive Overall Cognitive Status: No family/caregiver present to determine baseline cognitive functioning Area of Impairment: Orientation;Attention;Memory;Following commands;Safety/judgement;Awareness;Problem solving                 Orientation Level: Disoriented to;Time;Situation Current Attention Level: Sustained Memory: Decreased short-term memory Following Commands: Follows multi-step  commands inconsistently;Follows one step commands with increased time Safety/Judgement: Decreased awareness of deficits;Decreased awareness of safety Awareness:  Emergent Problem Solving: Slow processing;Requires verbal cues General Comments: Pt continues to present with impulsivity and poor inhabilition with inappropriate behavior. Pt requiring VCs to initate grooming.  Pt stating he wants to go home to see his cat and mom; then stated his mom lives in [place] and he will visit her after he is discharged        Exercises     Shoulder Instructions       General Comments Vitals in bed before mobility (BP 162/89, HR 70 O2 97% on RA). Vitals in chair after amb (BP 178/95 HR 104 O2 99% on RA)    Pertinent Vitals/ Pain       Pain Assessment: No/denies pain  Home Living                                          Prior Functioning/Environment              Frequency  Min 3X/week        Progress Toward Goals  OT Goals(current goals can now be found in the care plan section)  Progress towards OT goals: Progressing toward goals  Acute Rehab OT Goals Patient Stated Goal: return to PLOF OT Goal Formulation: With patient Time For Goal Achievement: 04/10/17 Potential to Achieve Goals: Good ADL Goals Pt Will Perform Grooming: with min guard assist;standing Pt Will Perform Upper Body Bathing: with set-up;with supervision;sitting Pt Will Perform Lower Body Bathing: with min guard assist;sit to/from stand Pt Will Transfer to Toilet: with min guard assist;ambulating;bedside commode Pt Will Perform Toileting - Clothing Manipulation and hygiene: with min guard assist;sit to/from stand  Plan Discharge plan remains appropriate    Co-evaluation                 AM-PAC PT "6 Clicks" Daily Activity     Outcome Measure   Help from another person eating meals?: A Little Help from another person taking care of personal grooming?: A Lot Help from another person toileting, which includes using toliet, bedpan, or urinal?: A Lot Help from another person bathing (including washing, rinsing, drying)?: A Lot Help from another  person to put on and taking off regular upper body clothing?: A Little Help from another person to put on and taking off regular lower body clothing?: A Lot 6 Click Score: 14    End of Session Equipment Utilized During Treatment: Gait belt  OT Visit Diagnosis: Unsteadiness on feet (R26.81);Other abnormalities of gait and mobility (R26.89);Muscle weakness (generalized) (M62.81);Cognitive communication deficit (R41.841)   Activity Tolerance Patient tolerated treatment well   Patient Left with call bell/phone within reach;Other (comment);with nursing/sitter in room (with NT in bathroom for bathing)   Nurse Communication Mobility status;Other (comment) (Pt needing bath)        Time: 6045-4098 OT Time Calculation (min): 16 min  Charges: OT General Charges $OT Visit: 1 Visit OT Treatments $Self Care/Home Management : 8-22 mins  Prezley Qadir MSOT, OTR/L Acute Rehab Pager: [phone] Office: [phone]   Theodoro Grist Jamesia Linnen 03/29/2017, 12:32 PM

## 2017-03-29 NOTE — Progress Notes (Signed)
PROGRESS NOTE    Stanley Nielsen  ZOX:[id number] DOB: [birth year] DOA: 03/26/2017 PCP: Reid, [place], MD   Brief Narrative: Stanley Nielsen is a 76 y.o. male with medical history significant of hypertension, diet-controlled diabetes, GERD, gout, depression, dementia, vertigo, OSA on CPAP, syncope, CKD-3, who presents with fall, weakness in arms and legs. He was found to have an acute stroke.   Assessment & Plan:   Principal Problem:   Ischemic stroke (HCC) Active Problems:   Dementia   Hypertension   GERD (gastroesophageal reflux disease)   Acute renal failure superimposed on stage 3 chronic kidney disease (HCC)   Acute metabolic encephalopathy   Type II diabetes mellitus with renal manifestations (HCC)   Diabetes mellitus type 2 in nonobese (HCC)   Stage 3 chronic kidney disease (HCC)   OSA (obstructive sleep apnea)   Diastolic dysfunction   Hypokalemia   Ischemic stroke MRI significant for a 14mm acute ischemic infarct of the right paramedian pons. CTA head/neck significant for severe right MCA M2 segment stenosis and moderate stenosis of left MCA M2. LDL of 107. A1C of 7.1%. Echocardiogram significant for grade 1 diastolic dysfunction. PT/OT recommending CIR -CIR consult -SW consult for SNF -continue aspirin -atorvastatin  daily  Acute kidney injury on CKD III Resolved.  Essential hypertension -Holding antihypertensives secondary to acute stroke  Diabetes mellitus A1C of 7.1%. Diet controlled as an outpatient. -SSI while inpatient -will recommend metformin on discharge -diabetes coordinator  Dementia Patient has waxing and waning symptoms. [place] that come for three hours per day. Per daughter, she thinks his dementia is worsening and that he may eventually need to be in an ALF. -continue galantamine  Hypokalemia -replete  Diastolic heart dysfunction Grade 1. EF of 60-65%. Euvolemic.   DVT prophylaxis: Lovenox Code  Status: Full code Family Communication: None at bedside Disposition Plan: Discharge to CIR vs SNF   Consultants:   PM&R  Neurology  Procedures:   Echocardiogram (03/27/17)  Antimicrobials:  None    Subjective: Afebrile  Objective: Vitals:   03/28/17 1825 03/28/17 2113 03/29/17 0513 03/29/17 0912  BP: (!) 171/83 (!) 175/86 (!) 160/83 (!) 149/87  Pulse: 79 70 79 78  Resp: Temp: 98.2 F (36.8 C) 98.5 F (36.9 C) 98.4 F (36.9 C) 97.6 F (36.4 C)  TempSrc: Oral Oral Oral Oral  SpO2: 98% 98% 100% 98%  Weight:      Height:        Intake/Output Summary (Last 24 hours) at 03/29/17 1151 Last data filed at 03/29/17 0912  Gross per 24 hour  Intake              480 ml  Output                0 ml  Net              480 ml   Filed Weights   03/26/17 1502 03/26/17 2345  Weight: 78 kg (171 lb 15.3 oz) 92.2 kg (203 lb 4.8 oz)    Examination:  General exam: Appears calm and comfortable Respiratory system: Clear to auscultation. Respiratory effort normal. Cardiovascular system: S1 & S2 heard, RRR. No murmurs. Gastrointestinal system: Abdomen is nondistended, soft and nontender. Normal bowel sounds heard. Central nervous system: Sleeping. Extremities: No edema. No calf tenderness Skin: No cyanosis. No rashes Psychiatry: Judgement and insight appear normal. Mood & affect appropriate.     Data Reviewed: I  have personally reviewed following labs and imaging studies  CBC:  Recent Labs Lab 03/26/17 1448 03/26/17 1454  WBC 8.7  --   NEUTROABS 4.7  --   HGB 15.5 14.6  HCT 43.8 43.0  MCV 85.4  --   PLT 201  --    Basic Metabolic Panel:  Recent Labs Lab 03/26/17 1448 03/26/17 1454 03/28/17 0529  NA 138 143 139  K 3.9 3.2* 2.8*  CL 107 107 106  CO2 22  --  24  GLUCOSE 198* 201* 152*  BUN CREATININE 1.60* 1.40* 1.11  CALCIUM 9.2  --  8.7*   GFR: Estimated Creatinine Clearance: 61.2 mL/min (by C-G formula based on SCr of 1.11  mg/dL). Liver Function Tests:  Recent Labs Lab 03/26/17 1448  AST 57*  ALT 32  ALKPHOS 77  BILITOT 2.0*  PROT 7.8  ALBUMIN 3.9   No results for input(s): LIPASE, AMYLASE in the last 168 hours.  Recent Labs Lab 03/26/17 1729  AMMONIA 35   Coagulation Profile:  Recent Labs Lab 03/26/17 1448  INR 1.06   HbA1C:  Recent Labs  03/27/17 0529  HGBA1C 7.1*   CBG:  Recent Labs Lab 03/28/17 0616 03/28/17 1143 03/28/17 1637 03/28/17 2111 03/29/17 0622  GLUCAP 147* 170* 172* 168* 157*   Lipid Profile:  Recent Labs  03/27/17 0529  CHOL 159  HDL 32*  LDLCALC 107*  TRIG 100  CHOLHDL 5.0   Thyroid Function Tests: No results for input(s): TSH, T4TOTAL, FREET4, T3FREE, THYROIDAB in the last 72 hours. Anemia Panel: No results for input(s): VITAMINB12, FOLATE, FERRITIN, TIBC, IRON, RETICCTPCT in the last 72 hours. Sepsis Labs: No results for input(s): PROCALCITON, LATICACIDVEN in the last 168 hours.  No results found for this or any previous visit (from the past 240 hour(s)).       Radiology Studies: No results found.      Scheduled Meds: . aspirin  300 mg Rectal Daily   Or  . aspirin  325 mg Oral Daily  . atorvastatin  40 mg Oral q1800  . enoxaparin (LOVENOX) injection  40 mg Subcutaneous Q24H  . galantamine  4 mg Oral BID WC  . insulin aspart  0-5 Units Subcutaneous QHS  . insulin aspart  0-9 Units Subcutaneous TID WC  . pantoprazole  40 mg Oral Daily   Continuous Infusions: . chlorproMAZINE (THORAZINE) IV 12.5 mg (03/27/17 0624)     LOS: 3 days     Jacquelin Hawking, MD [facility] 03/29/2017, 11:51 AM Pager: [phone]  If 7PM-7AM, please contact night-coverage [email or web address] Password [email or web address] 03/29/2017, 11:51 AM

## 2017-03-29 NOTE — Progress Notes (Signed)
Inpatient Diabetes Program Recommendations  AACE/ADA: New Consensus Statement on Inpatient Glycemic Control (2015)  Target Ranges:  Prepandial:   less than 140 mg/dL      Peak postprandial:   less than 180 mg/dL (1-2 hours)      Critically ill patients:  140 - 180 mg/dL   Results for MARCELLO, TUZZOLINO (MRN [id number]) as of 03/29/2017 08:04  Ref. Range 03/28/2017 06:16 03/28/2017 11:43 03/28/2017 16:37 03/28/2017 21:11 03/29/2017 06:22  Glucose-Capillary Latest Ref Range: 65 - 99 mg/dL 829 (H) 562 (H) 130 (H) 168 (H) 157 (H)  Results for HOBSON, LAX (MRN [id number]) as of 03/29/2017 08:04  Ref. Range 03/27/2017 05:29  Hemoglobin A1C Latest Ref Range: 4.8 - 5.6 % 7.1 (H)   Review of Glycemic Control  Diabetes history: DM2 (diet controlled) Outpatient Diabetes medications: None Current orders for Inpatient glycemic control: Novolog 0-9 units TID with meals, Novolog 0-5 units QHS  Inpatient Diabetes Program Recommendations: HgbA1C: A1C 7.1% on 03/27/17 indicating an average glucose of 157 mg/dl over the past 2-3 months. Diet: Please discontinue Regular diet and order Carb Modified diet.  NOTE: Noted consult for Diabetes Coordinator regarding DM. Patient has DM2 hx with is diet controlled. A1C indicates fair glycemic control over the past 2-3 months. Inpatient glycemic control is trending well with sensitive Novolog correction scale. Only recommendation at this time is to change diet from Regular to Carb Modified. Will continue to follow.  Thanks, Orlando Penner, RN, MSN, CDE Diabetes Coordinator Inpatient Diabetes Program [phone] (Team Pager from 8am to 5pm)

## 2017-03-29 NOTE — Care Management Note (Signed)
Case Management Note  Patient Details  Name: Stanley Nielsen MRN: [id number] Date of Birth: [birth year]  Subjective/Objective:    Pt admitted with CVA. He is from home alone. Pt does have 3 hours a day of aide assistance through [facility].                 Action/Plan: PT/OT recommending CIR. [facility] and not [facility] to [facility] out of pocket for [facility]. CM met with the patient and he is interested in going to SNF rehab if the [place] covers it. CM attempted to speak to CSW at [facility] but they are closed for the holiday.  CM did speak to patients daughter, [name], who lives in [place]. She is his POA. She also is interested in him going to rehab prior to returning home.  [facility] and see if [facility] is service connected and qualifies for rehab. CSW updated. CM following.  Expected Discharge Date:  03/28/17               Expected Discharge Plan:  Skilled Nursing Facility  In-House Referral:  Clinical Social Work  Discharge planning Services  CM Consult  Post Acute Care Choice:    Choice offered to:     DME Arranged:    DME Agency:     HH Arranged:    [place] Agency:     Status of Service:  In process, will continue to follow  If discussed at Long Length of Stay Meetings, dates discussed:    Additional Comments:  Pollie Friar, RN 03/29/2017, 4:25 PM

## 2017-03-30 LAB — GLUCOSE, CAPILLARY
Glucose-Capillary: 148 mg/dL — ABNORMAL HIGH (ref 65–99)
Glucose-Capillary: 149 mg/dL — ABNORMAL HIGH (ref 65–99)

## 2017-03-30 MED ORDER — METFORMIN HCL 500 MG PO TABS: 500.0000 mg | ORAL_TABLET | Freq: Two times a day (BID) | ORAL | 0 refills | Status: DC

## 2017-03-30 MED ORDER — ASPIRIN 325 MG PO TABS: 325.0000 mg | ORAL_TABLET | Freq: Every day | ORAL | 0 refills | Status: DC

## 2017-03-30 MED ORDER — ATORVASTATIN CALCIUM 40 MG PO TABS: 40.0000 mg | ORAL_TABLET | Freq: Every day | ORAL | 0 refills | Status: DC

## 2017-03-30 NOTE — Discharge Instructions (Addendum)
Stanley Nielsen,  You were admitted for a stroke. You were also found to have diabetes. You have been started on aspirin, Lipitor and Metformin. Please follow-up with your primary care physician. Your blood pressure medications have been held. Please restart these one at a time in the next 5 days.

## 2017-03-30 NOTE — Progress Notes (Signed)
CM attempted to call [facility] SNF placement coordinators yesterday without success. CM called again this am and finally was able to talk to a CSW in the [facility] at the [place]. Per CSW he is not services connected. She did encourage CM to try and speak to the SNF placement coordinators to see if any exceptions could be made. CM attempted to reach both coordinators without success. Will updated CSW. CM following.

## 2017-03-30 NOTE — Progress Notes (Signed)
Physical Therapy Treatment Patient Details Name: Stanley Nielsen MRN: [id number] DOB: 1940/11/22 Today's Date: 03/30/2017    History of Present Illness Pt is a 76 y.o. male s/p CVA. 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 PMHx inclusive of HTN, diet-controlled diabetes, GERD, gout, depression, dementia, vertigo, OSA on CPAP, syncope and CKD stage III.    PT Comments    Patient is making limited progress toward mobility goals but remains limited by safety and cognition. Still demonstrating impulsive and inappropriate behavior impacting judgment and safety awareness. Patient needs multimodal cues for pacing during ambulation and for safe and appropriate stair management. He demonstrated instability with stairs requiring min assist for stability to ascend. He will benefit from continued skilled PT services to address deficits and maximize function. PT will continue to follow acutely and progress as tolerated.    Follow Up Recommendations  SNF vs HHPT and Additional Aides (patient not safe for d/c home alone from safety perspective)     Equipment Recommendations   (TBD)    Recommendations for Other Services Rehab consult     Precautions / Restrictions Precautions Precautions: Fall Restrictions Weight Bearing Restrictions: No    Mobility  Bed Mobility               General bed mobility comments: Pt OOB in recliner upon arrival  Transfers Overall transfer level: Needs assistance Equipment used: None Transfers: Sit to/from Stand Sit to Stand: Supervision         General transfer comment: supervision for safety  Ambulation/Gait Ambulation/Gait assistance: Supervision;Min guard Ambulation Distance (Feet): 50 Feet (3 bouts of 50') Assistive device: None Gait Pattern/deviations: Step-through pattern;Decreased dorsiflexion - right;Decreased dorsiflexion - left;Decreased stride length;Drifts  right/left;Staggering right;Staggering left Gait velocity: decreased Gait velocity interpretation: Below normal speed for age/gender General Gait Details: slow and steady requiring VCs for pacing and control, patient impulsive and wanting to run/jump during ambulation. Instability noted by staggering to L but no significant LOB this session. Supervision for safety with min guard intermittently    Stairs Stairs: Yes   Stair Management: One rail Right;Two rails;Alternating pattern;Step to pattern   General stair comments: patient demonstrated difficulty with clearing toes as he ascended steps. Needed min assist for stability and VCs to slow down and increase hip flexion to clear step. Alternated between alternating steps and step to pattern. Descended with more control and only physical assist from both rails.   Wheelchair Mobility    Modified Rankin (Stroke Patients Only) Modified Rankin (Stroke Patients Only) Pre-Morbid Rankin Score: No symptoms Modified Rankin: Moderate disability     Balance Overall balance assessment: Needs assistance Sitting-balance support: Feet supported;No upper extremity supported Sitting balance-Leahy Scale: Fair Sitting balance - Comments: L lateral not significant today but did not challenge sitting balance   Standing balance support: During functional activity;No upper extremity supported Standing balance-Leahy Scale: Good Standing balance comment: able to static stand without support and weight shift, not challenged significantly and limitiations are still noted             High level balance activites: Turns              Cognition Arousal/Alertness: Awake/alert Behavior During Therapy: Impulsive Overall Cognitive Status: No family/caregiver present to determine baseline cognitive functioning Area of Impairment: Orientation;Attention;Memory;Following commands;Safety/judgement;Awareness;Problem solving                 Orientation  Level: Disoriented to;Time Current Attention Level: Sustained  Memory: Decreased short-term memory Following Commands: Follows multi-step commands with increased time;Follows multi-step commands inconsistently Safety/Judgement: Decreased awareness of deficits;Decreased awareness of safety Awareness: Emergent Problem Solving: Slow processing;Requires verbal cues;Requires tactile cues General Comments: Pt continues to present with impuslive and inappropriate behavior. Requires VCs and TCs to stay on task and bring awareness to safety.       Exercises      General Comments General comments (skin integrity, edema, etc.): VSS      Pertinent Vitals/Pain Pain Assessment: No/denies pain    Home Living                      Prior Function            PT Goals (current goals can now be found in the care plan section) Acute Rehab PT Goals Patient Stated Goal: to go home PT Goal Formulation: With patient Time For Goal Achievement: 04/10/17 Potential to Achieve Goals: Good Progress towards PT goals: Progressing toward goals    Frequency    Min 3X/week      PT Plan Current plan remains appropriate    Co-evaluation              AM-PAC PT "6 Clicks" Daily Activity  Outcome Measure  Difficulty turning over in bed (including adjusting bedclothes, sheets and blankets)?: A Little Difficulty moving from lying on back to sitting on the side of the bed? : A Little Difficulty sitting down on and standing up from a chair with arms (e.g., wheelchair, bedside commode, etc,.)?: A Little Help needed moving to and from a bed to chair (including a wheelchair)?: A Little Help needed walking in hospital room?: A Little Help needed climbing 3-5 steps with a railing? : A Lot 6 Click Score: 17    End of Session Equipment Utilized During Treatment: Gait belt Activity Tolerance: Patient tolerated treatment well Patient left: in chair;with chair alarm set;with call bell/phone within  reach Nurse Communication: Mobility status PT Visit Diagnosis: Unsteadiness on feet (R26.81);Hemiplegia and hemiparesis Hemiplegia - Right/Left: Left Hemiplegia - dominant/non-dominant: Non-dominant Hemiplegia - caused by: Cerebral infarction     Time: 1610-9604 PT Time Calculation (min) (ACUTE ONLY): 13 min  Charges:  $Gait Training: 8-22 mins                    G CodesMckinley Nielsen, SPT [phone] office    Stanley Nielsen 03/30/2017, 12:10 PM   Stanley Nielsen, PT DPT  Board Certified Neurologic Specialist [phone]

## 2017-03-30 NOTE — Discharge Summary (Signed)
Physician Discharge Summary  Stanley Nielsen GEX:[id number] DOB: Jun 19, 1941 DOA: 03/26/2017  PCP: Reid, [place], MD  Admit date: 03/26/2017 Discharge date: 03/30/2017  Admitted From: Home Disposition: Home  Recommendations for Outpatient Follow-up:  1. Follow up with PCP in 1 week 2. Titrate up antihypertensives 3. Diabetes mellitus management 4. Please follow up on the following pending results: None  Home Health: None (no insurance benefit) Equipment/Devices: None  Discharge Condition: Stable CODE STATUS: Full code Diet recommendation: Heart healthy/carb modified   Brief/Interim Summary:  Admission HPI written by Lorretta Harp, MD   Chief Complaint: fall, weakness in arms and left leg, AMS  HPI: Stanley Nielsen is a 76 y.o. male with medical history significant of hypertension, diet-controlled diabetes, GERD, gout, depression, dementia, vertigo, OSA on CPAP, syncope, CKD-3, who presents with fall, weakness in arms and legs.  Per report, pt came home after shopping, and fell when he was going up the steps, then became unresponsive about 2:30 PM. There is a housekeeper in the house, who witnessed this fall and called EMS. On ems arrival, pt was confused and had gcs of 13. When I saw pt in ED, he is mental status has improved. He is oriented 3. He reports that he had weakness in both arms and the left leg. He denies vision change or hearing loss. No slurred speech or facial droop. Currently patient does not have chest pain, SOB, cough, fever or chills. He vomited once after he had MRI of the brain, currently no nausea, vomiting, diarrhea or abdominal pain. Denies symptoms of UTI.was negative. He has skin abrasion in left shin.  ED Course: pt was found to have WBC 8.7, INR 1.06, ammonia 35, pending urinalysis, worsening renal function, temperature normal, no tachycardia, oxygen saturation 95% on room air. CT head negative. MRI of the brain showed acute stroke. Patient is admitted to  telemetry bed as inpatient. Neurology was consulted.     Hospital course:  Ischemic stroke MRI significant for a 14mm acute ischemic infarct of the right paramedian pons. CTA head/neck significant for severe right MCA M2 segment stenosis and moderate stenosis of left MCA M2. LDL of 107. A1C of 7.1%. Echocardiogram significant for grade 1 diastolic dysfunction. PT/OT recommending CIR, however, patient not able to qualify secondary to insurance (also unable to qualify for SNF). Patient sent home with arrangements for 24 hour care. Unable to obtain physical therapy options for him on discharge. Discharged with aspirin 325mg  and Lipitor 40mg .  Acute kidney injury on CKD III Resolved after fluids.  Essential hypertension Holding antihypertensives secondary to acute stroke. Resume slowly in next 5 days.  Diabetes mellitus A1C of 7.1%. Diet controlled as an outpatient. SSI while inpatient. Metformin on discharge.  Dementia Patient has waxing and waning symptoms. [place] that come for three hours per day. Per daughter, she thinks his dementia is worsening and that he may eventually need to be in an ALF. Continued galantamine. 24 hour care arranged by his daughter prior to discharge.  Hypokalemia Supplementation given  Diastolic heart dysfunction Grade 1. EF of 60-65%. Euvolemic.   Discharge Diagnoses:  Principal Problem:   Ischemic stroke [facility]) Active Problems:   Dementia   Hypertension   GERD (gastroesophageal reflux disease)   Acute renal failure superimposed on stage 3 chronic kidney disease (HCC)   Acute metabolic encephalopathy   Type II diabetes mellitus with renal manifestations (HCC)   Diabetes mellitus type 2 in nonobese (HCC)   Stage  3 chronic kidney disease (HCC)   OSA (obstructive sleep apnea)   Diastolic dysfunction   Hypokalemia    Discharge Instructions  Discharge Instructions    Call MD for:  difficulty breathing,  headache or visual disturbances    Complete by:  As directed    Call MD for:  extreme fatigue    Complete by:  As directed    Call MD for:  persistant dizziness or light-headedness    Complete by:  As directed    Diet - low sodium heart healthy    Complete by:  As directed    Increase activity slowly    Complete by:  As directed      Allergies as of 03/30/2017      Reactions   Penicillins Hives   Has patient had a PCN reaction causing immediate rash, facial/tongue/throat swelling, SOB or lightheadedness with hypotension: No Has patient had a PCN reaction causing severe rash involving mucus membranes or skin necrosis: No Has patient had a PCN reaction that required hospitalization: No Has patient had a PCN reaction occurring within the last 10 years: No If all of the above answers are "NO", then may proceed with Cephalosporin use.   Sulfa Antibiotics Nausea And Vomiting      Medication List    STOP taking these medications   amLODipine 10 MG tablet Commonly known as:  NORVASC   losartan 50 MG tablet Commonly known as:  COZAAR     TAKE these medications   aspirin 325 MG tablet Take 1 tablet (325 mg total) by mouth daily.   atorvastatin 40 MG tablet Commonly known as:  LIPITOR Take 1 tablet (40 mg total) by mouth daily at 6 PM.   galantamine 4 MG tablet Commonly known as:  RAZADYNE Take 4 mg by mouth 2 (two) times daily with a meal.   metFORMIN 500 MG tablet Commonly known as:  GLUCOPHAGE Take 1 tablet (500 mg total) by mouth 2 (two) times daily with a meal.   omeprazole 20 MG capsule Commonly known as:  PRILOSEC Take 20 mg by mouth daily.   ondansetron 4 MG disintegrating tablet Commonly known as:  ZOFRAN ODT Take 1 tablet (4 mg total) by mouth every 8 (eight) hours as needed for nausea or vomiting.   PRESCRIPTION MEDICATION Apply 1 application topically daily as needed (use on his legs for iching).       Allergies  Allergen Reactions  . Penicillins Hives     Has patient had a PCN reaction causing immediate rash, facial/tongue/throat swelling, SOB or lightheadedness with hypotension: No Has patient had a PCN reaction causing severe rash involving mucus membranes or skin necrosis: No Has patient had a PCN reaction that required hospitalization: No Has patient had a PCN reaction occurring within the last 10 years: No If all of the above answers are "NO", then may proceed with Cephalosporin use.  . Sulfa Antibiotics Nausea And Vomiting    Consultations:  Neurology   Procedures/Studies: Ct Angio Head W Or Wo Contrast  Result Date: 03/26/2017 CLINICAL DATA:  Stroke. Neurological deficit for greater than 6 hours. EXAM: CT ANGIOGRAPHY HEAD AND NECK TECHNIQUE: Multidetector CT imaging of the head and neck was performed using the standard protocol during bolus administration of intravenous contrast. Multiplanar CT image reconstructions and MIPs were obtained to evaluate the vascular anatomy. Carotid stenosis measurements (when applicable) are obtained utilizing NASCET criteria, using the distal internal carotid diameter as the denominator. CONTRAST:  50 mL Isovue 370 COMPARISON:  Brain MRI 03/26/2017 FINDINGS: CTA NECK FINDINGS Aortic arch: There is no aneurysm or dissection of the visualized ascending aorta or aortic arch. There is a normal variant aortic arch branching pattern with the brachiocephalic and left common carotid arteries sharing a common origin. The visualized proximal subclavian arteries are normal. Right carotid system: The right common carotid origin is widely patent. There is no common carotid or internal carotid artery dissection or aneurysm. Atherosclerotic calcification at the carotid bifurcation without hemodynamically significant stenosis. Left carotid system: The left common carotid origin is widely patent. There is no common carotid or internal carotid artery dissection or aneurysm. No hemodynamically significant stenosis. Vertebral  arteries: The vertebral system is left dominant. Both vertebral artery origins are normal. Both vertebral arteries are normal to their confluence with the basilar artery. Skeleton: There is no bony spinal canal stenosis. No lytic or blastic lesions. Other neck: 9 mm left level IIa lymph node. No pharyngeal or laryngeal abnormality. Normal thyroid. Upper chest: No pneumothorax or pleural effusion. No nodules or masses. Review of the MIP images confirms the above findings CTA HEAD FINDINGS Anterior circulation: --Intracranial internal carotid arteries: Bilateral atherosclerotic calcification of the internal carotid arteries at the skullbase without hemodynamically significant stenosis. --Anterior cerebral arteries: Normal. --Middle cerebral arteries: Multifocal moderate narrowing of the M2 segments of the left middle cerebral artery. Mild narrowing of the right M1 segment and severe narrowing of the right M2 inferior division. --Posterior communicating arteries: Absent bilaterally. Posterior circulation: --Posterior cerebral arteries: Moderate to severe bilateral P2 segment stenosis. --Superior cerebellar arteries: Normal. --Basilar artery: Normal. --Anterior inferior cerebellar arteries: Not clearly visualized, which is not uncommon. --Posterior inferior cerebellar arteries: Normal. Venous sinuses: As permitted by contrast timing, patent. Anatomic variants: None Delayed phase: No parenchymal contrast enhancement. There is periventricular hypoattenuation compatible with chronic microvascular disease. Review of the MIP images confirms the above findings IMPRESSION: 1. No emergent large vessel occlusion. 2. 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 3. Moderate stenosis of the left MCA M2 segments and moderate to severe bilateral posterior cerebral artery P2 segment stenosis. 4. No dissection, occlusion or stenosis of the major cervical arteries. 5. Bilateral calcific atherosclerosis of the internal carotid  arteries at the skullbase without high-grade stenosis. Electronically Signed   By: Deatra Robinson M.D.   On: 03/26/2017 22:21   Ct Angio Neck W And/or Wo Contrast  Result Date: 03/26/2017 CLINICAL DATA:  Stroke. Neurological deficit for greater than 6 hours. EXAM: CT ANGIOGRAPHY HEAD AND NECK TECHNIQUE: Multidetector CT imaging of the head and neck was performed using the standard protocol during bolus administration of intravenous contrast. Multiplanar CT image reconstructions and MIPs were obtained to evaluate the vascular anatomy. Carotid stenosis measurements (when applicable) are obtained utilizing NASCET criteria, using the distal internal carotid diameter as the denominator. CONTRAST:  50 mL Isovue 370 COMPARISON:  Brain MRI 03/26/2017 FINDINGS: CTA NECK FINDINGS Aortic arch: There is no aneurysm or dissection of the visualized ascending aorta or aortic arch. There is a normal variant aortic arch branching pattern with the brachiocephalic and left common carotid arteries sharing a common origin. The visualized proximal subclavian arteries are normal. Right carotid system: The right common carotid origin is widely patent. There is no common carotid or internal carotid artery dissection or aneurysm. Atherosclerotic calcification at the carotid bifurcation without hemodynamically significant stenosis. Left carotid system: The left common carotid origin is widely patent. There is no common carotid or internal carotid artery dissection or aneurysm. No hemodynamically significant  stenosis. Vertebral arteries: The vertebral system is left dominant. Both vertebral artery origins are normal. Both vertebral arteries are normal to their confluence with the basilar artery. Skeleton: There is no bony spinal canal stenosis. No lytic or blastic lesions. Other neck: 9 mm left level IIa lymph node. No pharyngeal or laryngeal abnormality. Normal thyroid. Upper chest: No pneumothorax or pleural effusion. No nodules or  masses. Review of the MIP images confirms the above findings CTA HEAD FINDINGS Anterior circulation: --Intracranial internal carotid arteries: Bilateral atherosclerotic calcification of the internal carotid arteries at the skullbase without hemodynamically significant stenosis. --Anterior cerebral arteries: Normal. --Middle cerebral arteries: Multifocal moderate narrowing of the M2 segments of the left middle cerebral artery. Mild narrowing of the right M1 segment and severe narrowing of the right M2 inferior division. --Posterior communicating arteries: Absent bilaterally. Posterior circulation: --Posterior cerebral arteries: Moderate to severe bilateral P2 segment stenosis. --Superior cerebellar arteries: Normal. --Basilar artery: Normal. --Anterior inferior cerebellar arteries: Not clearly visualized, which is not uncommon. --Posterior inferior cerebellar arteries: Normal. Venous sinuses: As permitted by contrast timing, patent. Anatomic variants: None Delayed phase: No parenchymal contrast enhancement. There is periventricular hypoattenuation compatible with chronic microvascular disease. Review of the MIP images confirms the above findings IMPRESSION: 1. No emergent large vessel occlusion. 2. 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 3. Moderate stenosis of the left MCA M2 segments and moderate to severe bilateral posterior cerebral artery P2 segment stenosis. 4. No dissection, occlusion or stenosis of the major cervical arteries. 5. Bilateral calcific atherosclerosis of the internal carotid arteries at the skullbase without high-grade stenosis. Electronically Signed   By: Deatra Robinson M.D.   On: 03/26/2017 22:21   Mr Brain Wo Contrast  Result Date: 03/26/2017 CLINICAL DATA:  Initial evaluation for acute syncope. EXAM: MRI HEAD WITHOUT CONTRAST TECHNIQUE: Multiplanar, multiecho pulse sequences of the brain and surrounding structures were obtained without intravenous contrast. COMPARISON:   Priors CT from earlier the same day. FINDINGS: Brain: Moderately advance generalized cerebral atrophy. Patchy and confluent T2/FLAIR hyperintensity within the periventricular and deep white matter of both cerebral hemispheres most consistent with chronic small vessel ischemic disease, also moderate nature. There is patchy diffusion abnormality involving the mid and ventral right paramedian pons (series 3, image 15), consistent with acute ischemic perforator type infarct. This measures approximately 14 mm in length. No associated mass effect or hemorrhage. No other evidence for acute or subacute ischemia. Gray-white matter differentiation otherwise maintained. Small remote lacunar infarcts noted within the right basal ganglia and right thalamus. Remote Single punctate chronic microhemorrhage noted within the ventral right thalamus. No other evidence for acute or chronic intracranial hemorrhage. No mass lesion, midline shift or mass effect. Ventricular prominence related to global parenchymal volume loss without hydrocephalus. No extra-axial fluid collection. Major dural sinuses are grossly patent. Pituitary gland and suprasellar region within normal limits. Midline structures intact and normal. Vascular: Major intracranial vascular flow voids maintained. Skull and upper cervical spine: Craniocervical junction normal. Upper cervical spine within normal limits. Bone marrow signal intensity normal. No scalp soft tissue abnormality. Sinuses/Orbits: Globes and orbital soft tissues within normal limits. Patient status post lens extraction on the left. Mild mucosal thickening noted within the ethmoidal air cells and maxillary sinuses superimposed retention cysts noted within left maxillary sinus. Paranasal sinuses otherwise clear. No mastoid effusion. Inner ear structures normal. Other: None. IMPRESSION: 1. 14 mm acute ischemic nonhemorrhagic perforator type infarct involving the right paramedian pons. 2. Remote lacunar  infarcts involving the right basal  ganglia and right thalamus. 3. Moderate cerebral atrophy with chronic small vessel ischemic disease. Electronically Signed   By: Rise Mu M.D.   On: 03/26/2017 20:09   Ct Head Code Stroke Wo Contrast  Result Date: 03/26/2017 CLINICAL DATA:  Code stroke. Syncopal episode, last seen normal 1345 hours. EXAM: CT HEAD WITHOUT CONTRAST TECHNIQUE: Contiguous axial images were obtained from the base of the skull through the vertex without intravenous contrast. COMPARISON:  None. FINDINGS: Brain: No evidence of acute infarction, hemorrhage, hydrocephalus, extra-axial collection or mass lesion/mass effect. Advanced atrophy. Hypoattenuation of white matter, consistent with chronic microvascular ischemic change. Chronic infarction affecting RIGHT basal ganglia, predominantly caudate and anterior internal capsule. Vascular: Calcification of the cavernous internal carotid arteries consistent with cerebrovascular atherosclerotic disease. No signs of intracranial large vessel occlusion. Skull: Normal. Negative for fracture or focal lesion. Sinuses/Orbits: No acute finding. Other: None. ASPECTS [facility] Stroke Program Early CT Score) - Ganglionic level infarction (caudate, lentiform nuclei, internal capsule, insula, M1-M3 cortex): 7 - Supraganglionic infarction (M4-M6 cortex): 3 Total score (0-10 with 10 being normal): 10 IMPRESSION: 1. Atrophy and small vessel disease. No acute intracranial findings. 2. ASPECTS is 10. These results were called by telephone at the time of interpretation on 03/26/2017 at 3:03 pm to Dr. Wilford Corner, who verbally acknowledged these results. Electronically Signed   By: Elsie Stain M.D.   On: 03/26/2017 15:06     Echocardiogram (03/27/17)  Study Conclusions  - Left ventricle: The cavity size was normal. There was severe   focal basal hypertrophy of the septum with otherwise moderate   concentric LVH. Systolic function was normal. The estimated    ejection fraction was in the range of 60% to 65%. There was   dynamic obstruction at rest, with a Nielsen velocity of 209 cm/sec   and a Nielsen gradient of 17 mm Hg. Wall motion was normal; there   were no regional wall motion abnormalities. Doppler parameters   are consistent with abnormal left ventricular relaxation (grade 1   diastolic dysfunction). Doppler parameters are consistent with   indeterminate ventricular filling pressure. - Aortic valve: Transvalvular velocity was within the normal range.   There was no stenosis. There was no regurgitation. Valve area   (VTI): 2.08 cm^2. Valve area (Vmax): 1.67 cm^2. Valve area   (Vmean): 1.95 cm^2. - Mitral valve: Transvalvular velocity was within the normal range.   There was no evidence for stenosis. There was no regurgitation. - Right ventricle: The cavity size was normal. Wall thickness was   normal. Systolic function was normal. - Tricuspid valve: There was mild regurgitation. - Pulmonary arteries: Systolic pressure was within the normal   range. PA Nielsen pressure: 22 mm Hg (S).   Subjective: No concerns today  Discharge Exam: Vitals:   03/30/17 0530 03/30/17 1100  BP: (!) 163/99 (!) 172/95  Pulse: 69 82  Resp: 20 20  Temp: (!) 97.5 F (36.4 C) 98.4 F (36.9 C)  SpO2: 98% 98%   Vitals:   03/29/17 1655 03/29/17 2110 03/30/17 0530 03/30/17 1100  BP: (!) 196/97 (!) 158/74 (!) 163/99 (!) 172/95  Pulse: 86 73 69 82  Resp: Temp: 98.3 F (36.8 C) 97.6 F (36.4 C) (!) 97.5 F (36.4 C) 98.4 F (36.9 C)  TempSrc: Oral Oral Oral Oral  SpO2: 100% 99% 98% 98%  Weight:      Height:        General: Pt is alert, awake, not in acute distress Cardiovascular:  RRR, S1/S2 +, no rubs, no gallops Respiratory: CTA bilaterally, no wheezing, no rhonchi Abdominal: Soft, NT, ND, bowel sounds + Extremities: no edema, no cyanosis    The results of significant diagnostics from this hospitalization (including imaging,  microbiology, ancillary and laboratory) are listed below for reference.     Microbiology: No results found for this or any previous visit (from the past 240 hour(s)).   Labs: BNP (last 3 results) No results for input(s): BNP in the last 8760 hours. Basic Metabolic Panel:  Recent Labs Lab 03/26/17 1448 03/26/17 1454 03/28/17 0529  NA 138 143 139  K 3.9 3.2* 2.8*  CL 107 107 106  CO2 22  --  24  GLUCOSE 198* 201* 152*  BUN CREATININE 1.60* 1.40* 1.11  CALCIUM 9.2  --  8.7*   Liver Function Tests:  Recent Labs Lab 03/26/17 1448  AST 57*  ALT 32  ALKPHOS 77  BILITOT 2.0*  PROT 7.8  ALBUMIN 3.9   No results for input(s): LIPASE, AMYLASE in the last 168 hours.  Recent Labs Lab 03/26/17 1729  AMMONIA 35   CBC:  Recent Labs Lab 03/26/17 1448 03/26/17 1454  WBC 8.7  --   NEUTROABS 4.7  --   HGB 15.5 14.6  HCT 43.8 43.0  MCV 85.4  --   PLT 201  --    Cardiac Enzymes: No results for input(s): CKTOTAL, CKMB, CKMBINDEX, TROPONINI in the last 168 hours. BNP: Invalid input(s): POCBNP CBG:  Recent Labs Lab 03/29/17 1152 03/29/17 1629 03/29/17 2109 03/30/17 0616 03/30/17 1121  GLUCAP 194* 137* 177* 148* 149*   D-Dimer No results for input(s): DDIMER in the last 72 hours. Hgb A1c No results for input(s): HGBA1C in the last 72 hours. Lipid Profile No results for input(s): CHOL, HDL, LDLCALC, TRIG, CHOLHDL, LDLDIRECT in the last 72 hours. Thyroid function studies No results for input(s): TSH, T4TOTAL, T3FREE, THYROIDAB in the last 72 hours.  Invalid input(s): FREET3 Anemia work up No results for input(s): VITAMINB12, FOLATE, FERRITIN, TIBC, IRON, RETICCTPCT in the last 72 hours. Urinalysis    Component Value Date/Time   COLORURINE YELLOW 03/26/2017 2320   APPEARANCEUR CLEAR 03/26/2017 2320   LABSPEC 1.040 (H) 03/26/2017 2320   PHURINE 5.0 03/26/2017 2320   GLUCOSEU 50 (A) 03/26/2017 2320   HGBUR NEGATIVE 03/26/2017 2320   BILIRUBINUR  NEGATIVE 03/26/2017 2320   KETONESUR 5 (A) 03/26/2017 2320   PROTEINUR 100 (A) 03/26/2017 2320   NITRITE NEGATIVE 03/26/2017 2320   LEUKOCYTESUR NEGATIVE 03/26/2017 2320    Time coordinating discharge: Over 30 minutes  SIGNED:   Jacquelin Hawking, MD [facility] 03/30/2017, 1:24 PM Pager [phone]  If 7PM-7AM, please contact night-coverage [email or web address] Password [email or web address]

## 2017-03-30 NOTE — Progress Notes (Signed)
Pt unable to go to rehab under his [facility] insurance d/t not being service connected. CM spoke to patients daughter, Stanley Nielsen, about pt not being able to get into rehab and could the hours of his home caregivers be increased. She was in agreement and called his aide service and was able to arrange 24 hour care for at least the next several days.  MD placed orders for [facility] services. CM spoke to Sophia at the [facility] and inquired about [facility] services. She stated to fax the PT/OT notes and his PCP would look at them and decide if he would order the [facility] services. Information requested faxed to the number provided: [phone]. CM also inquired through [facility] to see if he qualifies for charity [facility] and per [facility] he does not qualify. MD updated.  Daughter requested information on ALFs in the [place] area. CM emailed her a list. Patients cousin is going to provide transportation home today. He also is in contact with the aide service and they are going to meet the patient at his home.

## 2017-03-30 NOTE — Progress Notes (Signed)
PT Cancellation Note  Patient Details Name: Stanley Nielsen MRN: [id number] DOB: 01-12-1941   Cancelled Treatment:    Reason Eval/Treat Not Completed: Tried several times to get patient up and educated him on importance of mobility.  Patient stated "I don't feel like getting up". Will check back as schedule allows.   [name] 03/30/2017, 9:04 AM  [name], SPT [phone] office

## 2017-07-05 ENCOUNTER — Other Ambulatory Visit: Payer: Self-pay

## 2017-07-05 ENCOUNTER — Emergency Department
Admission: EM | Admit: 2017-07-05 | Discharge: 2017-07-05 | Disposition: A | Discharge status: Home / Self Care | Payer: Non-veteran care | Attending: Emergency Medicine | Admitting: Emergency Medicine

## 2017-07-05 DIAGNOSIS — F0390 Unspecified dementia without behavioral disturbance: Secondary | ICD-10-CM | POA: Insufficient documentation

## 2017-07-05 DIAGNOSIS — Z0130 Encounter for examination of blood pressure without abnormal findings: Secondary | ICD-10-CM | POA: Diagnosis present

## 2017-07-05 DIAGNOSIS — I130 Hypertensive heart and chronic kidney disease with heart failure and stage 1 through stage 4 chronic kidney disease, or unspecified chronic kidney disease: Secondary | ICD-10-CM | POA: Insufficient documentation

## 2017-07-05 DIAGNOSIS — I5030 Unspecified diastolic (congestive) heart failure: Secondary | ICD-10-CM | POA: Insufficient documentation

## 2017-07-05 DIAGNOSIS — N183 Chronic kidney disease, stage 3 (moderate): Secondary | ICD-10-CM | POA: Insufficient documentation

## 2017-07-05 DIAGNOSIS — I10 Essential (primary) hypertension: Secondary | ICD-10-CM

## 2017-07-05 DIAGNOSIS — Z7982 Long term (current) use of aspirin: Secondary | ICD-10-CM | POA: Insufficient documentation

## 2017-07-05 DIAGNOSIS — Z79899 Other long term (current) drug therapy: Secondary | ICD-10-CM | POA: Insufficient documentation

## 2017-07-05 DIAGNOSIS — E1122 Type 2 diabetes mellitus with diabetic chronic kidney disease: Secondary | ICD-10-CM | POA: Insufficient documentation

## 2017-07-05 DIAGNOSIS — Z7984 Long term (current) use of oral hypoglycemic drugs: Secondary | ICD-10-CM | POA: Diagnosis not present

## 2017-07-05 LAB — COMPREHENSIVE METABOLIC PANEL
ALK PHOS: 77 U/L (ref 38–126)
ALT: 32 U/L (ref 17–63)
ANION GAP: 10 (ref 5–15)
AST: 41 U/L (ref 15–41)
Albumin: 4.2 g/dL (ref 3.5–5.0)
BILIRUBIN TOTAL: 1 mg/dL (ref 0.3–1.2)
BUN: 12 mg/dL (ref 6–20)
CALCIUM: 9.6 mg/dL (ref 8.9–10.3)
CO2: 26 mmol/L (ref 22–32)
Chloride: 106 mmol/L (ref 101–111)
Creatinine, Ser: 1.24 mg/dL (ref 0.61–1.24)
GFR, EST NON AFRICAN AMERICAN: 55 mL/min — AB (ref >60)
Glucose, Bld: 157 mg/dL — ABNORMAL HIGH (ref 65–99)
Potassium: 4.3 mmol/L (ref 3.5–5.1)
Sodium: 142 mmol/L (ref 135–145)
TOTAL PROTEIN: 8.5 g/dL — AB (ref 6.5–8.1)

## 2017-07-05 LAB — CBC WITH DIFFERENTIAL/PLATELET
BASOS PCT: 1 %
Basophils Absolute: 0.1 10*3/uL (ref 0–0.1)
Eosinophils Absolute: 0.2 10*3/uL (ref 0–0.7)
Eosinophils Relative: 3 %
HEMATOCRIT: 45.2 % (ref 40.0–52.0)
Hemoglobin: 15.5 g/dL (ref 13.0–18.0)
LYMPHS ABS: 2.3 10*3/uL (ref 1.0–3.6)
LYMPHS PCT: 32 %
MCH: 29.6 pg (ref 26.0–34.0)
MCHC: 34.3 g/dL (ref 32.0–36.0)
MCV: 86.2 fL (ref 80.0–100.0)
MONO ABS: 0.5 10*3/uL (ref 0.2–1.0)
MONOS PCT: 8 %
Neutro Abs: 4 10*3/uL (ref 1.4–6.5)
Neutrophils Relative %: 56 %
Platelets: 190 10*3/uL (ref 150–440)
RBC: 5.24 MIL/uL (ref 4.40–5.90)
RDW: 14.1 % (ref 11.5–14.5)
WBC: 7 10*3/uL (ref 3.8–10.6)

## 2017-07-05 MED ORDER — AMLODIPINE BESYLATE 5 MG PO TABS: 5.0000 mg | ORAL_TABLET | Freq: Every day | ORAL | 0 refills | Status: DC

## 2017-07-05 MED ORDER — LOSARTAN POTASSIUM 50 MG PO TABS: 50.0000 mg | ORAL_TABLET | Freq: Every day | ORAL | 0 refills | Status: DC

## 2017-07-05 NOTE — ED Triage Notes (Signed)
Pt to triage via wheelchair.  Pt brought in via ems from home.  Pt reports elevated blood pressure today  No headache.  No chest pain .  No sob.  Pt alert.  Speech clear.

## 2017-07-05 NOTE — ED Provider Notes (Signed)
[facility] Emergency Department Provider Note  ____________________________________________  Time seen: Approximately 6:06 PM  I have reviewed the triage vital signs and the nursing notes.   HISTORY  Chief Complaint Hypertension    HPI Stanley Nielsen is a 77 y.o. male comes the ED for evaluation of blood pressure. He is noted that his blood pressure is been about 190/100 for the past 4 or 5 days at least. Usually his blood pressure runs about 150. Denies any complaints whatsoever. He incidentally found this when he was checking his blood pressure in a store. He hasn't seen his primary care doctor many months. In addition to the medication list here, he also takes losartan 25 mg a day and amlodipine 2.5 mg a day.     Past Medical History:  Diagnosis Date  . CKD (chronic kidney disease), stage III (HCC)   . Dementia   . Depression   . Gout   . HOH (hard of hearing)   . Hypertension   . Sleep apnea   . Vertigo      Patient Active Problem List   Diagnosis Date Noted  . Diabetes mellitus type 2 in nonobese (HCC)   . Stage 3 chronic kidney disease (HCC)   . OSA (obstructive sleep apnea)   . Diastolic dysfunction   . Hypokalemia   . GERD (gastroesophageal reflux disease) 03/26/2017  . Acute renal failure superimposed on stage 3 chronic kidney disease (HCC) 03/26/2017  . Ischemic stroke (HCC) 03/26/2017  . Acute metabolic encephalopathy 03/26/2017  . 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 Dementia   . Hypertension      Past Surgical History:  Procedure Laterality Date  . CATARACT EXTRACTION W/PHACO Left 04/30/2015   Procedure: CATARACT EXTRACTION PHACO AND INTRAOCULAR LENS PLACEMENT (IOC);  Surgeon: Galen Manila, MD;  Location: [facility] ORS;  Service: Ophthalmology;  Laterality: Left;  [place] 00:47 AP% 23.5 CDE 11.16 fluid pack lot # [id number]  . COLONOSCOPY       Prior to Admission medications   Medication Sig  Start Date End Date Taking? Authorizing Provider  amLODipine (NORVASC) 5 MG tablet Take 1 tablet (5 mg total) by mouth daily. 07/05/17   Sharman Cheek, MD  aspirin 325 MG tablet Take 1 tablet (325 mg total) by mouth daily. 03/31/17   Narda Bonds, MD  atorvastatin (LIPITOR) 40 MG tablet Take 1 tablet (40 mg total) by mouth daily at 6 PM. 03/30/17   Narda Bonds, MD  galantamine (RAZADYNE) 4 MG tablet Take 4 mg by mouth 2 (two) times daily with a meal.    [provider]  losartan (COZAAR) 50 MG tablet Take 1 tablet (50 mg total) by mouth daily. 07/05/17 07/05/18  Sharman Cheek, MD  metFORMIN (GLUCOPHAGE) 500 MG tablet Take 1 tablet (500 mg total) by mouth 2 (two) times daily with a meal. 03/30/17 03/30/18  Narda Bonds, MD  omeprazole (PRILOSEC) 20 MG capsule Take 20 mg by mouth daily.    [provider]  ondansetron (ZOFRAN ODT) 4 MG disintegrating tablet Take 1 tablet (4 mg total) by mouth every 8 (eight) hours as needed for nausea or vomiting. 06/14/15   Minna Antis, MD  PRESCRIPTION MEDICATION Apply 1 application topically daily as needed (use on his legs for iching).    [provider]     Allergies Penicillins and Sulfa antibiotics   Family History  Problem Relation Age of Onset  . Dementia Mother   .  Alcoholism Father     Social History Social History   Tobacco Use  . Smoking status: Never Smoker  . Smokeless tobacco: Never Used  Substance Use Topics  . Alcohol use: Yes    Comment: OCC  . Drug use: No    Review of Systems  Constitutional:   No fever or chills.  ENT:   No sore throat. No rhinorrhea. Cardiovascular:   No chest pain or syncope. Respiratory:   No dyspnea or cough. Gastrointestinal:   Negative for abdominal pain, vomiting and diarrhea.  Musculoskeletal:   Negative for focal pain or swelling All other systems reviewed and are negative except as documented above in ROS and  HPI.  ____________________________________________   PHYSICAL EXAM:  VITAL SIGNS: ED Triage Vitals  Enc Vitals Group     BP 07/05/17 1545 (!) 187/93     Pulse Rate 07/05/17 1545 66     Resp 07/05/17 1545 20     Temp 07/05/17 1545 99 F (37.2 C)     Temp Source 07/05/17 1545 Oral     SpO2 07/05/17 1545 98 %     Weight 07/05/17 1546 220 lb (99.8 kg)     Height 07/05/17 1546 5\' 7"  (1.702 m)     Head Circumference --      Peak Flow --      Pain Score 07/05/17 1710 0     Pain Loc --      Pain Edu? --      Excl. in GC? --     Vital signs reviewed, nursing assessments reviewed.   Constitutional:   Alert and oriented. Well appearing and in no distress. Eyes:   No scleral icterus.  EOMI. No nystagmus. No conjunctival pallor. PERRL. ENT   Head:   Normocephalic and atraumatic.   Nose:   No congestion/rhinnorhea.    Mouth/Throat:   MMM, no pharyngeal erythema. No peritonsillar mass.    Neck:   No meningismus. Full ROM. Hematological/Lymphatic/Immunilogical:   No cervical lymphadenopathy. Cardiovascular:   RRR. Symmetric bilateral radial and DP pulses.  No murmurs.  Respiratory:   Normal respiratory effort without tachypnea/retractions. Breath sounds are clear and equal bilaterally. No wheezes/rales/rhonchi. Gastrointestinal:   Soft and nontender. Non distended. There is no CVA tenderness.  No rebound, rigidity, or guarding. Genitourinary:   deferred Musculoskeletal:   Normal range of motion in all extremities. No joint effusions.  No lower extremity tenderness.  No edema. Neurologic:   Normal speech and language.  Motor grossly intact. No gross focal neurologic deficits are appreciated.    ____________________________________________    LABS (pertinent positives/negatives) (all labs ordered are listed, but only abnormal results are displayed) Labs Reviewed  COMPREHENSIVE METABOLIC PANEL - Abnormal; Notable for the following components:      Result Value    Glucose, Bld 157 (*)    Total Protein 8.5 (*)    GFR calc non Af Amer 55 (*)    All other components within normal limits  CBC WITH DIFFERENTIAL/PLATELET   ____________________________________________   EKG    ____________________________________________    RADIOLOGY  No results found.  ____________________________________________   PROCEDURES Procedures  ____________________________________________    CLINICAL IMPRESSION / ASSESSMENT AND PLAN / ED COURSE  Pertinent labs & imaging results that were available during my care of the patient were reviewed by me and considered in my medical decision making (see chart for details).   Patient well appearing no acute distress, no complaints. Incidentally found to have poorly controlled blood  pressure and a fairly severe range. He is on minimal doses of both losartan and amlodipine, so I instructed him to increase both to gain control blood pressure. They understand that if his blood pressure starts to get low or he has dizziness with standing, he'll decrease the medications again. Plan to follow-up with his doctor the [facility] in a week. No evidence of stroke ACS PE dissection AAA or endocrine abnormality.  Clinical Course as of Jul 05 1804  Mon Jul 05, 2017  1723 Losartan 25mg  Amlodipine 2.5mg  Donepizil Metformin atorvastatin  [PS]    Clinical Course User Index [PS] Sharman CheekStafford, Candee Hoon, MD     ____________________________________________   FINAL CLINICAL IMPRESSION(S) / ED DIAGNOSES    Final diagnoses:  Essential hypertension       Portions of this note were generated with dragon dictation software. Dictation errors may occur despite best attempts at proofreading.    Sharman CheekStafford, Chisa Kushner, MD 07/05/17 [phone]

## 2017-07-05 NOTE — ED Triage Notes (Signed)
First Nurse Note:  Arrives with C/O HTN x 2-3 days.  [facility] reports that BP was 190/100

## 2018-04-04 ENCOUNTER — Other Ambulatory Visit: Payer: Self-pay | Admitting: Nurse Practitioner

## 2018-04-04 ENCOUNTER — Ambulatory Visit
Admission: RE | Admit: 2018-04-04 | Discharge: 2018-04-04 | Disposition: A | Discharge status: Home / Self Care | Payer: Non-veteran care | Source: Ambulatory Visit | Attending: Nurse Practitioner | Admitting: Nurse Practitioner

## 2018-04-04 DIAGNOSIS — R0781 Pleurodynia: Secondary | ICD-10-CM | POA: Diagnosis present

## 2018-04-04 DIAGNOSIS — Z9181 History of falling: Secondary | ICD-10-CM | POA: Insufficient documentation

## 2018-04-04 DIAGNOSIS — R937 Abnormal findings on diagnostic imaging of other parts of musculoskeletal system: Secondary | ICD-10-CM | POA: Insufficient documentation

## 2018-07-25 ENCOUNTER — Encounter: Payer: Self-pay | Admitting: Nurse Practitioner

## 2018-08-01 ENCOUNTER — Encounter: Payer: Self-pay | Admitting: *Deleted

## 2018-12-02 ENCOUNTER — Other Ambulatory Visit: Payer: Self-pay | Admitting: Family Medicine

## 2018-12-02 DIAGNOSIS — R4182 Altered mental status, unspecified: Secondary | ICD-10-CM

## 2019-04-26 ENCOUNTER — Emergency Department
Admission: EM | Admit: 2019-04-26 | Discharge: 2019-04-26 | Disposition: A | Discharge status: Home / Self Care | Payer: No Typology Code available for payment source | Attending: Emergency Medicine | Admitting: Emergency Medicine

## 2019-04-26 ENCOUNTER — Emergency Department: Payer: No Typology Code available for payment source

## 2019-04-26 ENCOUNTER — Other Ambulatory Visit: Payer: Self-pay

## 2019-04-26 DIAGNOSIS — F0390 Unspecified dementia without behavioral disturbance: Secondary | ICD-10-CM | POA: Diagnosis not present

## 2019-04-26 DIAGNOSIS — I129 Hypertensive chronic kidney disease with stage 1 through stage 4 chronic kidney disease, or unspecified chronic kidney disease: Secondary | ICD-10-CM | POA: Insufficient documentation

## 2019-04-26 DIAGNOSIS — R5383 Other fatigue: Secondary | ICD-10-CM | POA: Insufficient documentation

## 2019-04-26 DIAGNOSIS — E1122 Type 2 diabetes mellitus with diabetic chronic kidney disease: Secondary | ICD-10-CM | POA: Insufficient documentation

## 2019-04-26 DIAGNOSIS — Z79899 Other long term (current) drug therapy: Secondary | ICD-10-CM | POA: Diagnosis not present

## 2019-04-26 DIAGNOSIS — N183 Chronic kidney disease, stage 3 unspecified: Secondary | ICD-10-CM | POA: Insufficient documentation

## 2019-04-26 LAB — COMPREHENSIVE METABOLIC PANEL
ALT: 11 U/L (ref 0–44)
AST: 31 U/L (ref 15–41)
Albumin: 4.2 g/dL (ref 3.5–5.0)
Alkaline Phosphatase: 77 U/L (ref 38–126)
Anion gap: 11 (ref 5–15)
BUN: 11 mg/dL (ref 8–23)
CO2: 23 mmol/L (ref 22–32)
Calcium: 9.1 mg/dL (ref 8.9–10.3)
Chloride: 104 mmol/L (ref 98–111)
Creatinine, Ser: 1.16 mg/dL (ref 0.61–1.24)
GFR calc Af Amer: >60 mL/min (ref >60)
GFR calc non Af Amer: >60 mL/min (ref >60)
Glucose, Bld: 133 mg/dL — ABNORMAL HIGH (ref 70–99)
Potassium: 3.8 mmol/L (ref 3.5–5.1)
Sodium: 138 mmol/L (ref 135–145)
Total Bilirubin: 0.9 mg/dL (ref 0.3–1.2)
Total Protein: 8.2 g/dL — ABNORMAL HIGH (ref 6.5–8.1)

## 2019-04-26 LAB — CBC WITH DIFFERENTIAL/PLATELET
Abs Immature Granulocytes: 0.02 10*3/uL (ref 0.00–0.07)
Basophils Absolute: 0.1 10*3/uL (ref 0.0–0.1)
Basophils Relative: 1 %
Eosinophils Absolute: 0.1 10*3/uL (ref 0.0–0.5)
Eosinophils Relative: 2 %
HCT: 45.7 % (ref 39.0–52.0)
Hemoglobin: 15.4 g/dL (ref 13.0–17.0)
Immature Granulocytes: 0 %
Lymphocytes Relative: 35 %
Lymphs Abs: 2.1 10*3/uL (ref 0.7–4.0)
MCH: 29.6 pg (ref 26.0–34.0)
MCHC: 33.7 g/dL (ref 30.0–36.0)
MCV: 87.7 fL (ref 80.0–100.0)
Monocytes Absolute: 0.8 10*3/uL (ref 0.1–1.0)
Monocytes Relative: 14 %
Neutro Abs: 2.9 10*3/uL (ref 1.7–7.7)
Neutrophils Relative %: 48 %
Platelets: 167 10*3/uL (ref 150–400)
RBC: 5.21 MIL/uL (ref 4.22–5.81)
RDW: 13.2 % (ref 11.5–15.5)
WBC: 6 10*3/uL (ref 4.0–10.5)
nRBC: 0 % (ref 0.0–0.2)

## 2019-04-26 LAB — LIPASE, BLOOD: Lipase: 29 U/L (ref 11–51)

## 2019-04-26 MED ORDER — SODIUM CHLORIDE 0.9 % IV BOLUS
1000.0000 mL | Freq: Once | INTRAVENOUS | Status: AC
  Administered 2019-04-26: 16:00:00 1000 mL via INTRAVENOUS

## 2019-04-26 NOTE — ED Triage Notes (Signed)
pt arrives via ems from [facility] room 30, ems reports weakness x 1 week, productive cough w/ yellow phlem. facility reported 100.1 fever monday. pt was tested for covid yesterday, results currently not back.  Pt A&o x 4 on arrival, NAD noted at this time.  ems vital: 143/77 97%  78 HR

## 2019-04-26 NOTE — ED Provider Notes (Signed)
[facility] Emergency Department Provider Note  ____________________________________________  Time seen: Approximately 3:31 PM  I have reviewed the triage vital signs and the nursing notes.   HISTORY  Chief Complaint Fatigue and poss covid    HPI Stanley Nielsen is a 78 y.o. male with a history of CKD dementia hypertension diabetes who was sent to the ED from [facility] facility due to fatigue.  The patient reports having decreased energy level and decreased appetite over the past week.  EMS reports that facility had concerns about productive cough, patient denies cough but states that he has occasional sneezing.  Denies chest pain or shortness of breath currently, no abdominal pain body aches fevers chills or sweats.  Patient was tested for Covid at his facility yesterday, result not available yet.      Past Medical History:  Diagnosis Date  . CKD (chronic kidney disease), stage III   . Dementia ([place])   . Depression   . Gout   . HOH (hard of hearing)   . Hypertension   . Sleep apnea   . Vertigo      Patient Active Problem List   Diagnosis Date Noted  . Diabetes mellitus type 2 in nonobese (HCC)   . Stage 3 chronic kidney disease   . OSA (obstructive sleep apnea)   . Diastolic dysfunction   . Hypokalemia   . GERD (gastroesophageal reflux disease) 03/26/2017  . Acute renal failure superimposed on stage 3 chronic kidney disease ([place]) 03/26/2017  . Ischemic stroke ([place]) 03/26/2017  . Acute metabolic encephalopathy 25/85/2778  . Type II diabetes mellitus with renal manifestations ([place]) 03/26/2017  . Dementia ([place])   . Hypertension      Past Surgical History:  Procedure Laterality Date  . CATARACT EXTRACTION W/PHACO Left 04/30/2015   Procedure: CATARACT EXTRACTION PHACO AND INTRAOCULAR LENS PLACEMENT (IOC);  Surgeon: Birder Robson, MD;  Location: [facility] ORS;  Service: Ophthalmology;  Laterality: Left;  [place] 00:47 AP% 23.5 CDE  11.16 fluid pack lot # [id number]  . COLONOSCOPY       Prior to Admission medications   Medication Sig Start Date End Date Taking? Authorizing Provider  amLODipine (NORVASC) 5 MG tablet Take 1 tablet (5 mg total) by mouth daily. 07/05/17   Carrie Mew, MD  aspirin 325 MG tablet Take 1 tablet (325 mg total) by mouth daily. 03/31/17   Mariel Aloe, MD  atorvastatin (LIPITOR) 40 MG tablet Take 1 tablet (40 mg total) by mouth daily at 6 PM. 03/30/17   Mariel Aloe, MD  galantamine (RAZADYNE) 4 MG tablet Take 4 mg by mouth 2 (two) times daily with a meal.    [provider]  losartan (COZAAR) 50 MG tablet Take 1 tablet (50 mg total) by mouth daily. 07/05/17 07/05/18  Carrie Mew, MD  metFORMIN (GLUCOPHAGE) 500 MG tablet Take 1 tablet (500 mg total) by mouth 2 (two) times daily with a meal. 03/30/17 03/30/18  Mariel Aloe, MD  omeprazole (PRILOSEC) 20 MG capsule Take 20 mg by mouth daily.    [provider]  ondansetron (ZOFRAN ODT) 4 MG disintegrating tablet Take 1 tablet (4 mg total) by mouth every 8 (eight) hours as needed for nausea or vomiting. 06/14/15   Harvest Dark, MD  PRESCRIPTION MEDICATION Apply 1 application topically daily as needed (use on his legs for iching).    [provider]     Allergies Penicillins and Sulfa antibiotics   Family History  Problem Relation Age  of Onset  . Dementia Mother   . Alcoholism Father     Social History Social History   Tobacco Use  . Smoking status: Never Smoker  . Smokeless tobacco: Never Used  Substance Use Topics  . Alcohol use: Yes    Comment: OCC  . Drug use: No    Review of Systems  Constitutional:   No fever or chills.  ENT:   No sore throat. No rhinorrhea.  Positive for sneezing sometimes Cardiovascular:   No chest pain or syncope. Respiratory:   No dyspnea or cough. Gastrointestinal:   Negative for abdominal pain, vomiting and diarrhea.  Musculoskeletal:   Negative for focal  pain or swelling All other systems reviewed and are negative except as documented above in ROS and HPI.  ____________________________________________   PHYSICAL EXAM:  VITAL SIGNS: ED Triage Vitals  Enc Vitals Group     BP 04/26/19 1514 (!) 148/79     Pulse Rate 04/26/19 1514 77     Resp 04/26/19 1514 18     Temp 04/26/19 1514 98.2 F (36.8 C)     Temp Source 04/26/19 1514 Oral     SpO2 04/26/19 1514 97 %     Weight 04/26/19 1515 186 lb (84.4 kg)     Height 04/26/19 1515 5\' 7"  (1.702 m)     Head Circumference --      Peak Flow --      Pain Score 04/26/19 1515 0     Pain Loc --      Pain Edu? --      Excl. in GC? --     Vital signs reviewed, nursing assessments reviewed.   Constitutional:   Alert and oriented to person and place. Non-toxic appearance. Eyes:   Conjunctivae are normal. EOMI. PERRL. ENT      Head:   Normocephalic and atraumatic.      Nose:   Normal.      Mouth/Throat:   Dry mucous membranes.      Neck:   No meningismus. Full ROM. Hematological/Lymphatic/Immunilogical:   No cervical lymphadenopathy. Cardiovascular:   RRR. Symmetric bilateral radial and DP pulses.  No murmurs. Cap refill less than 2 seconds. Respiratory:   Normal respiratory effort without tachypnea/retractions. Breath sounds are clear and equal bilaterally. No wheezes/rales/rhonchi. Gastrointestinal:   Soft and nontender. Non distended. There is no CVA tenderness.  No rebound, rigidity, or guarding.  Musculoskeletal:   Normal range of motion in all extremities. No joint effusions.  No lower extremity tenderness.  No edema. Neurologic:   Normal speech and language.  Motor grossly intact. No acute focal neurologic deficits are appreciated.  Skin:    Skin is warm, dry and intact. No rash noted.  No petechiae, purpura, or bullae.  ____________________________________________    LABS (pertinent positives/negatives) (all labs ordered are listed, but only abnormal results are  displayed) Labs Reviewed  COMPREHENSIVE METABOLIC PANEL - Abnormal; Notable for the following components:      Result Value   Glucose, Bld 133 (*)    Total Protein 8.2 (*)    All other components within normal limits  LIPASE, BLOOD  CBC WITH DIFFERENTIAL/PLATELET  CBC WITH DIFFERENTIAL/PLATELET  URINALYSIS, COMPLETE (UACMP) WITH MICROSCOPIC   ____________________________________________   EKG  Interpreted by me Sinus rhythm rate of 78, normal axis and intervals.  Normal QRS ST segments and T waves.  ____________________________________________    RADIOLOGY  Dg Chest Portable 1 View  Result Date: 04/26/2019 CLINICAL DATA:  78 year old male with  a history of fatigue EXAM: PORTABLE CHEST 1 VIEW COMPARISON:  04/04/2018 FINDINGS: Cardiomediastinal silhouette unchanged in size and contour. No pneumothorax or pleural effusion. Coarsened interstitial markings similar to the comparison. No confluent airspace disease. No displaced fracture. IMPRESSION: Chronic lung changes without evidence of acute cardiopulmonary disease Electronically Signed   By: Gilmer Mor D.O.   On: 04/26/2019 16:18    ____________________________________________   PROCEDURES Procedures  ____________________________________________  DIFFERENTIAL DIAGNOSIS   Dehydration, viral syndrome, COVID-19, pneumonia, UTI, sleep deprivation, progression of dementia, electrolyte abnormality, malnutrition  CLINICAL IMPRESSION / ASSESSMENT AND PLAN / ED COURSE  Medications ordered in the ED: Medications  sodium chloride 0.9 % bolus 1,000 mL (1,000 mLs Intravenous New Bag/Given 04/26/19 1539)    Pertinent labs & imaging results that were available during my care of the patient were reviewed by me and considered in my medical decision making (see chart for details).  Rafi Kenneth was evaluated in Emergency Department on 04/26/2019 for the symptoms described in the history of present illness. He was evaluated in the  context of the global COVID-19 pandemic, which necessitated consideration that the patient might be at risk for infection with the SARS-CoV-2 virus that causes COVID-19. Institutional protocols and algorithms that pertain to the evaluation of patients at risk for COVID-19 are in a state of rapid change based on information released by regulatory bodies including the CDC and federal and state organizations. These policies and algorithms were followed during the patient's care in the ED.   Patient presents with fatigue.  Facility had reported productive cough, but on my exam patient has no cough and clear lungs.  Vital signs are normal.  No indication to retest him for Covid today since he has a test pending from his facility already.  We will check his labs and chest x-ray and if reassuring, plan to discharge back home.   ----------------------------------------- 6:05 PM on 04/26/2019 -----------------------------------------  Blood tests are all reassuring.  Chest x-ray unremarkable.  Vital signs remain normal.  Patient has not provided urine but he did void in the ED.  He states that he wants to go back to his facility immediately and not stay in the ED any longer.  I think this is reasonable and he can follow-up with his primary care doctor to continue assessing his symptoms, who can obtain a UA as needed.  No evidence of infection     ____________________________________________   FINAL CLINICAL IMPRESSION(S) / ED DIAGNOSES    Final diagnoses:  Fatigue, unspecified type     ED Discharge Orders    None      Portions of this note were generated with dragon dictation software. Dictation errors may occur despite best attempts at proofreading.   Sharman Cheek, MD 04/26/19 [phone]

## 2019-04-26 NOTE — Discharge Instructions (Addendum)
Your vital signs, blood tests and chest x-ray today were okay.  Please continue following up with your doctor to monitor your symptoms.

## 2019-04-26 NOTE — ED Notes (Addendum)
Pt removed IV,  States he is ready to go home. Blood cleaned off pt. Pt urinated on pants, asked pt to change out of pants into a gown but pt refused and still wearing soiled pants. Pt seems to be confused, and when asking if there was a family member I could call for transport he states "[place]". Pt informed that RN was attempting to call [facility] for report and arrange transport and pt said no that was too far out [place] country and that wasn't the right number, and that he lived on [address]. [facility] address and phone number was shown to pt, pt still seemed confused. Pt instructed to remain in room while RN arranges transport back to facility at this time.

## 2019-04-26 NOTE — ED Notes (Signed)
PT states unable to give urine sample at this time.  

## 2019-04-26 NOTE — ED Notes (Signed)
[facility]  CALLED  FOR  TRANSPORT 

## 2019-04-26 NOTE — ED Notes (Signed)
RN attempted to call [facility], phone rang for 7 mins prior to RN hanging up. Secretary asked to arrange ems transport for pt back to facility since no answer at facility, or by pt primary contact listed in demographics.   Pt currently standing at door in room 6 staring out into hall

## 2019-07-30 ENCOUNTER — Encounter: Payer: Self-pay | Admitting: Emergency Medicine

## 2019-07-30 ENCOUNTER — Emergency Department: Payer: No Typology Code available for payment source

## 2019-07-30 ENCOUNTER — Other Ambulatory Visit: Payer: Self-pay

## 2019-07-30 ENCOUNTER — Emergency Department
Admission: EM | Admit: 2019-07-30 | Discharge: 2019-07-30 | Disposition: A | Discharge status: Home / Self Care | Payer: No Typology Code available for payment source | Attending: Student | Admitting: Student

## 2019-07-30 DIAGNOSIS — I1310 Hypertensive heart and chronic kidney disease without heart failure, with stage 1 through stage 4 chronic kidney disease, or unspecified chronic kidney disease: Secondary | ICD-10-CM | POA: Insufficient documentation

## 2019-07-30 DIAGNOSIS — Z7984 Long term (current) use of oral hypoglycemic drugs: Secondary | ICD-10-CM | POA: Diagnosis not present

## 2019-07-30 DIAGNOSIS — N183 Chronic kidney disease, stage 3 unspecified: Secondary | ICD-10-CM | POA: Diagnosis not present

## 2019-07-30 DIAGNOSIS — Z79899 Other long term (current) drug therapy: Secondary | ICD-10-CM | POA: Diagnosis not present

## 2019-07-30 DIAGNOSIS — R55 Syncope and collapse: Secondary | ICD-10-CM | POA: Diagnosis not present

## 2019-07-30 DIAGNOSIS — E119 Type 2 diabetes mellitus without complications: Secondary | ICD-10-CM | POA: Insufficient documentation

## 2019-07-30 DIAGNOSIS — Z7982 Long term (current) use of aspirin: Secondary | ICD-10-CM | POA: Diagnosis not present

## 2019-07-30 LAB — CBC WITH DIFFERENTIAL/PLATELET
Abs Immature Granulocytes: 0.02 10*3/uL (ref 0.00–0.07)
Basophils Absolute: 0.1 10*3/uL (ref 0.0–0.1)
Basophils Relative: 1 %
Eosinophils Absolute: 0.3 10*3/uL (ref 0.0–0.5)
Eosinophils Relative: 4 %
HCT: 42.6 % (ref 39.0–52.0)
Hemoglobin: 14.4 g/dL (ref 13.0–17.0)
Immature Granulocytes: 0 %
Lymphocytes Relative: 33 %
Lymphs Abs: 2.3 10*3/uL (ref 0.7–4.0)
MCH: 29.8 pg (ref 26.0–34.0)
MCHC: 33.8 g/dL (ref 30.0–36.0)
MCV: 88.2 fL (ref 80.0–100.0)
Monocytes Absolute: 0.5 10*3/uL (ref 0.1–1.0)
Monocytes Relative: 7 %
Neutro Abs: 3.9 10*3/uL (ref 1.7–7.7)
Neutrophils Relative %: 55 %
Platelets: 167 10*3/uL (ref 150–400)
RBC: 4.83 MIL/uL (ref 4.22–5.81)
RDW: 13.6 % (ref 11.5–15.5)
WBC: 7.1 10*3/uL (ref 4.0–10.5)
nRBC: 0 % (ref 0.0–0.2)

## 2019-07-30 LAB — TROPONIN I (HIGH SENSITIVITY)
Troponin I (High Sensitivity): 7 ng/L (ref <18)
Troponin I (High Sensitivity): 7 ng/L (ref <18)

## 2019-07-30 LAB — URINALYSIS, ROUTINE W REFLEX MICROSCOPIC
Bilirubin Urine: NEGATIVE
Glucose, UA: NEGATIVE mg/dL
Hgb urine dipstick: NEGATIVE
Ketones, ur: 5 mg/dL — AB
Leukocytes,Ua: NEGATIVE
Nitrite: NEGATIVE
Protein, ur: NEGATIVE mg/dL
Specific Gravity, Urine: 1.009 (ref 1.005–1.030)
pH: 7 (ref 5.0–8.0)

## 2019-07-30 LAB — COMPREHENSIVE METABOLIC PANEL
ALT: 10 U/L (ref 0–44)
AST: 25 U/L (ref 15–41)
Albumin: 3.6 g/dL (ref 3.5–5.0)
Alkaline Phosphatase: 62 U/L (ref 38–126)
Anion gap: 8 (ref 5–15)
BUN: 15 mg/dL (ref 8–23)
CO2: 23 mmol/L (ref 22–32)
Calcium: 8.7 mg/dL — ABNORMAL LOW (ref 8.9–10.3)
Chloride: 109 mmol/L (ref 98–111)
Creatinine, Ser: 1.13 mg/dL (ref 0.61–1.24)
GFR calc Af Amer: >60 mL/min (ref >60)
GFR calc non Af Amer: >60 mL/min (ref >60)
Glucose, Bld: 130 mg/dL — ABNORMAL HIGH (ref 70–99)
Potassium: 4.2 mmol/L (ref 3.5–5.1)
Sodium: 140 mmol/L (ref 135–145)
Total Bilirubin: 1 mg/dL (ref 0.3–1.2)
Total Protein: 7.1 g/dL (ref 6.5–8.1)

## 2019-07-30 LAB — BRAIN NATRIURETIC PEPTIDE: B Natriuretic Peptide: 52 pg/mL (ref 0.0–100.0)

## 2019-07-30 MED ORDER — SODIUM CHLORIDE 0.9 % IV BOLUS
500.0000 mL | Freq: Once | INTRAVENOUS | Status: AC
  Administered 2019-07-30: 15:00:00 500 mL via INTRAVENOUS

## 2019-07-30 NOTE — ED Triage Notes (Addendum)
Pt via EMS from [facility]. Pt had a syncopal episode earlier today, pt got dizzy and lightheaded when he went from sitting to standing. Pt states he feels fine now. Pt is alert and speaking in complete sentences at this time. Pt has a hx dementia. Unknown if pt hit head.   CBG 168.

## 2019-07-30 NOTE — ED Notes (Signed)
Pt standing up in room, has removed self from monitoring equipment. Pt states "I would like you to hurry up, i'm tired and I want to get in my bed". Pt informed will need to arrange ride back to facility and to please be patient.

## 2019-07-30 NOTE — ED Notes (Signed)
Pt up in room, redirected to remain in bed until ems arrives. Pt states "they need to hurry up, i'm tired of waiting for them"

## 2019-07-30 NOTE — ED Notes (Signed)
Pt transported to MRI 

## 2019-07-30 NOTE — ED Provider Notes (Signed)
Patient states he is feeling improved, however now recalls that his symptoms today felt similar to when he had a prior stroke in 2018. On chart review, it does appear that he had an acute CVA during presentation cc of near syncope. CT head negative. Awaiting urine - perhaps UTI causing recrudescence of prior stroke like symptoms. Will also obtain MRI to r/o acute CVA given above.  UA negative for infection.  Repeat troponin negative.  MRI without acute findings.  As such, given negative work-up patient is stable for discharge with outpatient follow-up.  He is agreeable with plan.  Given return precautions.  Imaging: MRI: IMPRESSION:  No acute or reversible finding.  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    Miguel Aschoff., MD 07/30/19 [phone]

## 2019-07-30 NOTE — Discharge Instructions (Addendum)
You can follow-up with your primary care doctor for Holter monitor and repeat echocardiogram.  Return to the ER for recurrent passing out or any other concerns.  Continue to take your medications as prescribed.

## 2019-07-30 NOTE — ED Notes (Signed)
Report to diashiah, med tech at [facility].

## 2019-07-30 NOTE — ED Notes (Signed)
Pt ambulating around the room without assistance at this time. Pt NAD.

## 2019-07-30 NOTE — ED Provider Notes (Signed)
[facility] Emergency Department Provider Note  ____________________________________________   First MD Initiated Contact with Patient 07/30/19 1311     (approximate)  I have reviewed the triage vital signs and the nursing notes.   HISTORY  Chief Complaint Near Syncope    HPI Stanley Nielsen is a 79 y.o. male who resides at [facility] who comes in for near syncope.  Patient has a history of dementia and CKD.  Patient was with some other residents when he stood up and started feeling lightheaded and dizzy and nauseous.  He was able to sit back down and then he sat himself down on the ground.  He states he did not lose consciousness or did not hit his head however according to the facility they thought he did hit his head.  He denies any symptoms at this time.  Syncope was near, occurred 1 time, nothing made it better, worse with standing up.            Past Medical History:  Diagnosis Date  . CKD (chronic kidney disease), stage III   . Dementia ([place])   . Depression   . Gout   . HOH (hard of hearing)   . Hypertension   . Sleep apnea   . Vertigo     Patient Active Problem List   Diagnosis Date Noted  . Diabetes mellitus type 2 in nonobese (HCC)   . Stage 3 chronic kidney disease   . OSA (obstructive sleep apnea)   . Diastolic dysfunction   . Hypokalemia   . GERD (gastroesophageal reflux disease) 03/26/2017  . Acute renal failure superimposed on stage 3 chronic kidney disease ([place]) 03/26/2017  . Ischemic stroke ([place]) 03/26/2017  . Acute metabolic encephalopathy 24/40/1027  . Type II diabetes mellitus with renal manifestations ([place]) 03/26/2017  . Dementia ([place])   . Hypertension     Past Surgical History:  Procedure Laterality Date  . CATARACT EXTRACTION W/PHACO Left 04/30/2015   Procedure: CATARACT EXTRACTION PHACO AND INTRAOCULAR LENS PLACEMENT (IOC);  Surgeon: Birder Robson, MD;  Location: [facility] ORS;  Service: Ophthalmology;   Laterality: Left;  [place] 00:47 AP% 23.5 CDE 11.16 fluid pack lot # [id number]  . COLONOSCOPY      Prior to Admission medications   Medication Sig Start Date End Date Taking? Authorizing Provider  amLODipine (NORVASC) 5 MG tablet Take 1 tablet (5 mg total) by mouth daily. 07/05/17   Carrie Mew, MD  aspirin 325 MG tablet Take 1 tablet (325 mg total) by mouth daily. 03/31/17   Mariel Aloe, MD  atorvastatin (LIPITOR) 40 MG tablet Take 1 tablet (40 mg total) by mouth daily at 6 PM. 03/30/17   Mariel Aloe, MD  galantamine (RAZADYNE) 4 MG tablet Take 4 mg by mouth 2 (two) times daily with a meal.    [provider]  losartan (COZAAR) 50 MG tablet Take 1 tablet (50 mg total) by mouth daily. 07/05/17 07/05/18  Carrie Mew, MD  metFORMIN (GLUCOPHAGE) 500 MG tablet Take 1 tablet (500 mg total) by mouth 2 (two) times daily with a meal. 03/30/17 03/30/18  Mariel Aloe, MD  omeprazole (PRILOSEC) 20 MG capsule Take 20 mg by mouth daily.    [provider]  ondansetron (ZOFRAN ODT) 4 MG disintegrating tablet Take 1 tablet (4 mg total) by mouth every 8 (eight) hours as needed for nausea or vomiting. 06/14/15   Harvest Dark, MD  PRESCRIPTION MEDICATION Apply 1 application topically daily as needed (use  on his legs for iching).    [provider]    Allergies Penicillins and Sulfa antibiotics  Family History  Problem Relation Age of Onset  . Dementia Mother   . Alcoholism Father     Social History Social History   Tobacco Use  . Smoking status: Never Smoker  . Smokeless tobacco: Never Used  Substance Use Topics  . Alcohol use: Yes    Comment: OCC  . Drug use: No      Review of Systems Constitutional: No fever/chills Eyes: No visual changes. ENT: No sore throat. Cardiovascular: Denies chest pain. Respiratory: Denies shortness of breath. Gastrointestinal: No abdominal pain.  Positive nausea, no vomiting.  No diarrhea.  No  constipation. Genitourinary: Negative for dysuria. Musculoskeletal: Negative for back pain. Skin: Negative for rash. Neurological: Negative for headaches, focal weakness or numbness.  Positive near syncope, positive dizziness All other ROS negative ____________________________________________   PHYSICAL EXAM:  VITAL SIGNS: Blood pressure 135/75, pulse 65, temperature (!) 97.5 F (36.4 C), temperature source Oral, resp. rate 17, height 5\' 8"  (1.727 m), weight 81.6 kg, SpO2 100 %.   Constitutional: Alert and oriented. Well appearing and in no acute distress. Eyes: Conjunctivae are normal. EOMI. Head: Atraumatic. Nose: No congestion/rhinnorhea. Mouth/Throat: Mucous membranes are moist.   Neck: No stridor. Trachea Midline. FROM Cardiovascular: Normal rate, regular rhythm. Grossly normal heart sounds.  Good peripheral circulation. Respiratory: Normal respiratory effort.  No retractions. Lungs CTAB. Gastrointestinal: Soft and nontender. No distention. No abdominal bruits.  Musculoskeletal: No lower extremity tenderness nor edema.  No joint effusions. Neurologic:  Normal speech and language. No gross focal neurologic deficits are appreciated.  Skin:  Skin is warm, dry and intact. No rash noted. Psychiatric: Mood and affect are normal. Speech and behavior are normal. GU: Deferred   ____________________________________________   LABS (all labs ordered are listed, but only abnormal results are displayed)  Labs Reviewed  COMPREHENSIVE METABOLIC PANEL - Abnormal; Notable for the following components:      Result Value   Glucose, Bld 130 (*)    Calcium 8.7 (*)    All other components within normal limits  CBC WITH DIFFERENTIAL/PLATELET  BRAIN NATRIURETIC PEPTIDE  URINALYSIS, ROUTINE W REFLEX MICROSCOPIC  TROPONIN I (HIGH SENSITIVITY)  TROPONIN I (HIGH SENSITIVITY)   ____________________________________________   ED ECG REPORT I, , the attending physician, personally  viewed and interpreted this ECG.  EKG is normal sinus rate of 65, no ST elevation, no T wave inversions, normal intervals, a little bit of a wandering baseline ____________________________________________  RADIOLOGY I, Concha Se, personally viewed and evaluated these images (plain radiographs) as part of my medical decision making, as well as reviewing the written report by the radiologist.  ED MD interpretation: No acute hemorrhage  Official radiology report(s): CT Head Wo Contrast  Result Date: 07/30/2019 CLINICAL DATA:  Syncope, head trauma, headache EXAM: CT HEAD WITHOUT CONTRAST TECHNIQUE: Contiguous axial images were obtained from the base of the skull through the vertex without intravenous contrast. COMPARISON:  03/26/2017 FINDINGS: Brain: No evidence of acute infarction, hemorrhage, hydrocephalus, extra-axial collection or mass lesion/mass effect. Periventricular white matter hypodensity and global volume loss. Vascular: No hyperdense vessel or unexpected calcification. Skull: Normal. Negative for fracture or focal lesion. Sinuses/Orbits: No acute finding. Other: None. IMPRESSION: No acute intracranial pathology. Small-vessel white matter disease and global volume loss. Electronically Signed   By: 05/26/2017 M.D.   On: 07/30/2019 14:11    ____________________________________________   PROCEDURES  Procedure(s) performed (including Critical Care):  Procedures   ____________________________________________   INITIAL IMPRESSION / ASSESSMENT AND PLAN / ED COURSE  Stanley Nielsen was evaluated in Emergency Department on 07/30/2019 for the symptoms described in the history of present illness. He was evaluated in the context of the global COVID-19 pandemic, which necessitated consideration that the patient might be at risk for infection with the SARS-CoV-2 virus that causes COVID-19. Institutional protocols and algorithms that pertain to the evaluation of patients at risk for  COVID-19 are in a state of rapid change based on information released by regulatory bodies including the CDC and federal and state organizations. These policies and algorithms were followed during the patient's care in the ED.    Patient is a well-appearing 79 year old who presents with near syncope.  More likely orthostatic given it was happening when he stood up and he felt a prodromal symptoms of nausea and dizziness.  He never actually lost consciousness.  Will get labs to evaluate for electrolyte abnormalities, AKI, orthostatics evaluate for dehydration.  CT head to evaluate for intracranial hemorrhage given unclear if he hit his head.  No C-spine tenderness to suggest cervical fracture.  Will get EKG and cardiac markers to evaluate for arrhythmia.  EKG is reassuring.  First troponin is negative.  Will get repeat.  Labs are also reassuring.  CT head was negative.  Orthostatics were negative.  Reevaluated patient and he is feeling well.  We discussed admission for near syncopal work-up versus discharge home.  Patient states that he is had this previously and that he does not feel he needs to be admitted for this he is comfortable following up with his primary care doctor.  This is reasonable.  I did discuss with patient that he can follow-up with his primary care doctor for Holter and echo if he so chooses just to make sure that it is not related to his heart.  Patient felt comfortable with this plan.    Patient handed off to oncoming team pending UA, repeat troponin, ambulation   ____________________________________________   FINAL CLINICAL IMPRESSION(S) / ED DIAGNOSES   Final diagnoses:  Near syncope      MEDICATIONS GIVEN DURING THIS VISIT:  Medications  sodium chloride 0.9 % bolus 500 mL (500 mLs Intravenous New Bag/Given 07/30/19 1430)     ED Discharge Orders    None       Note:  This document was prepared using Dragon voice recognition software and may include  unintentional dictation errors.   Concha Se, MD 07/30/19 [phone]

## 2019-07-30 NOTE — ED Notes (Signed)
Pt requesting an update as far as his results and what he is waiting on now, pt reassured everything so far is looking good, pt informed that I would let the dr know that he was looking for an update, no distress noted at this time, cont to monitor

## 2019-12-18 ENCOUNTER — Telehealth: Payer: Self-pay | Admitting: Podiatry

## 2019-12-18 ENCOUNTER — Other Ambulatory Visit: Payer: Self-pay

## 2019-12-18 ENCOUNTER — Encounter: Payer: Self-pay | Admitting: Podiatry

## 2019-12-18 ENCOUNTER — Ambulatory Visit (INDEPENDENT_AMBULATORY_CARE_PROVIDER_SITE_OTHER): Payer: No Typology Code available for payment source | Admitting: Podiatry

## 2019-12-18 DIAGNOSIS — B351 Tinea unguium: Secondary | ICD-10-CM

## 2019-12-18 DIAGNOSIS — L309 Dermatitis, unspecified: Secondary | ICD-10-CM | POA: Insufficient documentation

## 2019-12-18 DIAGNOSIS — M79675 Pain in left toe(s): Secondary | ICD-10-CM

## 2019-12-18 DIAGNOSIS — M79674 Pain in right toe(s): Secondary | ICD-10-CM

## 2019-12-18 MED ORDER — FLUOCINONIDE-E 0.05 % EX CREA: TOPICAL_CREAM | CUTANEOUS | 0 refills | Status: DC

## 2019-12-18 NOTE — Telephone Encounter (Signed)
Stanley Nielsen from the assisted living facility called and stated that the medication for pt was not able to be filled due to the fact they needed it to be specific on the amount and the directions more specific

## 2019-12-18 NOTE — Progress Notes (Signed)
This patient returns to my office for at risk foot care.  This patient requires this care by a professional since this patient will be at risk due to having diabetes and stage 3 CKD.    This patient is unable to cut nails himself since the patient cannot reach his nails.These nails are painful walking and wearing shoes.  This patient presents for at risk foot care today.  He also has skin dermatitis on the top/outside of his right foot.  He says this has been there for a while.  He presents to the office with caregiver.  He does not know the medicine that he has been applying to his feet.  General Appearance  Alert, conversant and in no acute stress.  Vascular  Dorsalis pedis and posterior tibial  pulses are palpable  bilaterally.  Capillary return is within normal limits  bilaterally. Temperature is within normal limits  bilaterally.  Neurologic  Senn-Weinstein monofilament wire test within normal limits  bilaterally. Muscle power within normal limits bilaterally.  Nails Thick disfigured discolored nails with subungual debris  from hallux to fifth toes bilaterally. No evidence of bacterial infection or drainage bilaterally.  Orthopedic  No limitations of motion  feet .  No crepitus or effusions noted.  No bony pathology or digital deformities noted.  Skin  normotropic skin with no porokeratosis noted bilaterally.  No signs of infections or ulcers noted.   Scaly red inflamed excoriations on his right foot.  Onychomycosis  Pain in right toes  Pain in left toes  Dermatitis right foot.   Consent was obtained for treatment procedures.   Mechanical debridement of nails 1-5  bilaterally performed with a nail nipper.  Filed with dremel without incident. Patient says he has had psoriasis previously.  Therefore will prescribe lidex for this patient once we hear back from the caregiver.   Return office visit     3 months                 Told patient to return for periodic foot care and evaluation due to  potential at risk complications.   Helane Gunther DPM

## 2019-12-18 NOTE — Telephone Encounter (Signed)
Fax number to living facility [phone]

## 2019-12-19 ENCOUNTER — Telehealth: Payer: Self-pay | Admitting: Podiatry

## 2019-12-19 NOTE — Telephone Encounter (Signed)
Stanley Nielsen from the assisted living facility called and stated that the medication for pt was not able to be filled due to the fact they needed it to be specific on the amount and the directions more specific. Contact info for Clydie Braun [phone] please assist

## 2019-12-19 NOTE — Telephone Encounter (Signed)
Clydie Braun from tthe facility has called stating that she got the medication but it has to be more specific for the amount and the directions please assist

## 2019-12-19 NOTE — Telephone Encounter (Signed)
Dr. Stacie Acres states pt is to apply the Lidex in a fingertip amount to the rash on the right foot daily for 1 month, if no improvement pt needs an appt. I informed Clydie Braun at the Assisted Living center.

## 2020-05-04 ENCOUNTER — Other Ambulatory Visit: Payer: Self-pay

## 2020-05-04 ENCOUNTER — Emergency Department
Admission: EM | Admit: 2020-05-04 | Discharge: 2020-05-05 | Disposition: A | Discharge status: Other Acute Inpatient Hospital | Payer: No Typology Code available for payment source | Attending: Emergency Medicine | Admitting: Emergency Medicine

## 2020-05-04 DIAGNOSIS — R456 Violent behavior: Secondary | ICD-10-CM | POA: Insufficient documentation

## 2020-05-04 DIAGNOSIS — I129 Hypertensive chronic kidney disease with stage 1 through stage 4 chronic kidney disease, or unspecified chronic kidney disease: Secondary | ICD-10-CM | POA: Diagnosis not present

## 2020-05-04 DIAGNOSIS — Z7984 Long term (current) use of oral hypoglycemic drugs: Secondary | ICD-10-CM | POA: Insufficient documentation

## 2020-05-04 DIAGNOSIS — E1129 Type 2 diabetes mellitus with other diabetic kidney complication: Secondary | ICD-10-CM | POA: Diagnosis not present

## 2020-05-04 DIAGNOSIS — Z7982 Long term (current) use of aspirin: Secondary | ICD-10-CM | POA: Diagnosis not present

## 2020-05-04 DIAGNOSIS — E119 Type 2 diabetes mellitus without complications: Secondary | ICD-10-CM

## 2020-05-04 DIAGNOSIS — Z79899 Other long term (current) drug therapy: Secondary | ICD-10-CM | POA: Insufficient documentation

## 2020-05-04 DIAGNOSIS — R4689 Other symptoms and signs involving appearance and behavior: Secondary | ICD-10-CM

## 2020-05-04 DIAGNOSIS — N183 Chronic kidney disease, stage 3 unspecified: Secondary | ICD-10-CM | POA: Diagnosis not present

## 2020-05-04 DIAGNOSIS — F0391 Unspecified dementia with behavioral disturbance: Secondary | ICD-10-CM

## 2020-05-04 DIAGNOSIS — F03918 Unspecified dementia, unspecified severity, with other behavioral disturbance: Secondary | ICD-10-CM | POA: Diagnosis present

## 2020-05-04 DIAGNOSIS — G301 Alzheimer's disease with late onset: Secondary | ICD-10-CM | POA: Diagnosis not present

## 2020-05-04 DIAGNOSIS — F0390 Unspecified dementia without behavioral disturbance: Secondary | ICD-10-CM | POA: Insufficient documentation

## 2020-05-04 DIAGNOSIS — F0281 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 with behavioral disturbance: Secondary | ICD-10-CM | POA: Diagnosis not present

## 2020-05-04 DIAGNOSIS — I10 Essential (primary) hypertension: Secondary | ICD-10-CM | POA: Diagnosis present

## 2020-05-04 LAB — ACETAMINOPHEN LEVEL: Acetaminophen (Tylenol), Serum: <10 ug/mL — ABNORMAL LOW (ref 10–30)

## 2020-05-04 LAB — CBC
HCT: 44.2 % (ref 39.0–52.0)
Hemoglobin: 14.9 g/dL (ref 13.0–17.0)
MCH: 29.9 pg (ref 26.0–34.0)
MCHC: 33.7 g/dL (ref 30.0–36.0)
MCV: 88.6 fL (ref 80.0–100.0)
Platelets: 216 10*3/uL (ref 150–400)
RBC: 4.99 MIL/uL (ref 4.22–5.81)
RDW: 13.5 % (ref 11.5–15.5)
WBC: 7.8 10*3/uL (ref 4.0–10.5)
nRBC: 0 % (ref 0.0–0.2)

## 2020-05-04 LAB — COMPREHENSIVE METABOLIC PANEL
ALT: 12 U/L (ref 0–44)
AST: 21 U/L (ref 15–41)
Albumin: 4.1 g/dL (ref 3.5–5.0)
Alkaline Phosphatase: 80 U/L (ref 38–126)
Anion gap: 8 (ref 5–15)
BUN: 16 mg/dL (ref 8–23)
CO2: 23 mmol/L (ref 22–32)
Calcium: 8.9 mg/dL (ref 8.9–10.3)
Chloride: 106 mmol/L (ref 98–111)
Creatinine, Ser: 1.04 mg/dL (ref 0.61–1.24)
GFR, Estimated: >60 mL/min (ref >60)
Glucose, Bld: 234 mg/dL — ABNORMAL HIGH (ref 70–99)
Potassium: 3.3 mmol/L — ABNORMAL LOW (ref 3.5–5.1)
Sodium: 137 mmol/L (ref 135–145)
Total Bilirubin: 0.7 mg/dL (ref 0.3–1.2)
Total Protein: 8.3 g/dL — ABNORMAL HIGH (ref 6.5–8.1)

## 2020-05-04 LAB — ETHANOL: Alcohol, Ethyl (B): <10 mg/dL (ref <10)

## 2020-05-04 LAB — SALICYLATE LEVEL: Salicylate Lvl: <7 mg/dL — ABNORMAL LOW (ref 7.0–30.0)

## 2020-05-04 MED ORDER — METFORMIN HCL 500 MG PO TABS
500.0000 mg | ORAL_TABLET | Freq: Two times a day (BID) | ORAL | Status: DC
  Administered 2020-05-05: 500 mg via ORAL
  Filled 2020-05-04: qty 1

## 2020-05-04 MED ORDER — DOCUSATE SODIUM 100 MG PO CAPS
100.0000 mg | ORAL_CAPSULE | Freq: Two times a day (BID) | ORAL | Status: DC
  Administered 2020-05-05: 100 mg via ORAL
  Filled 2020-05-04: qty 1

## 2020-05-04 NOTE — ED Notes (Signed)
TTS bedside 

## 2020-05-04 NOTE — ED Notes (Signed)
Pt given phone to call [facility] to update family that pt will be staying over night.
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
## 2020-05-04 NOTE — ED Notes (Signed)
Call [facility] for a ride home

## 2020-05-04 NOTE — ED Notes (Addendum)
Pt belongings include pink shirt, white shirt, blue shorts, one pair underwear, two shoes, two socks, one watch, one brown belt, one jacket, one ring. RING AND WATCH AND WALLET GIVEN TO COUSIN, BARNEY.

## 2020-05-04 NOTE — ED Notes (Signed)
Pt provided with snack. No other needs at this time.
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
## 2020-05-05 DIAGNOSIS — R4689 Other symptoms and signs involving appearance and behavior: Secondary | ICD-10-CM

## 2020-05-05 DIAGNOSIS — F0281 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 with behavioral disturbance: Secondary | ICD-10-CM

## 2020-05-05 DIAGNOSIS — G301 Alzheimer's disease with late onset: Secondary | ICD-10-CM

## 2020-05-05 NOTE — ED Notes (Signed)
Pt given clothes back to change into.

## 2020-05-05 NOTE — ED Notes (Signed)
Pt's cousin called to pick up patient.

## 2020-05-05 NOTE — ED Notes (Signed)
Given lunch tray.

## 2020-05-05 NOTE — ED Notes (Signed)
Called [facility] who does not have transport today but reports they will attempt to call family.
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
## 2020-05-05 NOTE — ED Notes (Signed)
Cousin en route to pick pt up

## 2020-05-05 NOTE — ED Notes (Signed)
Pt refuses breakfast tray

## 2020-05-05 NOTE — ED Provider Notes (Signed)
Emergency Medicine Observation Re-evaluation Note  Stanley Nielsen is a 79 y.o. male, seen on rounds today.  Pt initially presented to the ED for complaints of Psychiatric Evaluation Currently, the patient is laying in bed watching TV, denies any complaints.  Physical Exam  BP (!) 153/81 (BP Location: Left Arm)   Pulse 71   Temp 97.7 F (36.5 C) (Oral)   Resp 15   Ht 5\' 10"  (1.778 m)   Wt 77.1 kg   SpO2 95%   BMI 24.39 kg/m  Physical Exam Constitutional: Resting comfortably. Eyes: Conjunctivae are normal. Head: Atraumatic. Nose: No congestion/rhinnorhea. Mouth/Throat: Mucous membranes are moist. Neck: Normal ROM Cardiovascular: No cyanosis noted. Respiratory: Normal respiratory effort. Gastrointestinal: Non-distended. Genitourinary: deferred Musculoskeletal: No lower extremity tenderness nor edema. Neurologic:  Normal speech and language. No gross focal neurologic deficits are appreciated. Skin:  Skin is warm, dry and intact. No rash noted.    ED Course / MDM  EKG:    I have reviewed the labs performed to date as well as medications administered while in observation.  Recent changes in the last 24 hours include patient tentatively cleared by psychiatry.  Plan  Current plan is for reassessment by psychiatry this morning. Patient is not under full IVC at this time.   , MD 05/05/20 [phone]

## 2020-05-05 NOTE — ED Provider Notes (Signed)
The patient has been evaluated at bedside by Dr. Toni Amend, psychiatry.  Patient is clinically stable.  Not felt to be a danger to self or others.  No SI or Hi.  No indication for inpatient psychiatric admission at this time.  Appropriate for continued outpatient therapy.    Willy Eddy, MD 05/05/20 1013
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
## 2020-05-05 NOTE — ED Notes (Signed)
Pt refuses shower.  

## 2020-07-24 ENCOUNTER — Emergency Department
Admission: EM | Admit: 2020-07-24 | Discharge: 2020-07-25 | Disposition: A | Discharge status: Home / Self Care | Payer: No Typology Code available for payment source | Attending: Emergency Medicine | Admitting: Emergency Medicine

## 2020-07-24 ENCOUNTER — Encounter: Payer: Self-pay | Admitting: Emergency Medicine

## 2020-07-24 DIAGNOSIS — N183 Chronic kidney disease, stage 3 unspecified: Secondary | ICD-10-CM | POA: Diagnosis not present

## 2020-07-24 DIAGNOSIS — F0281 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 with behavioral disturbance: Secondary | ICD-10-CM

## 2020-07-24 DIAGNOSIS — Z7982 Long term (current) use of aspirin: Secondary | ICD-10-CM | POA: Insufficient documentation

## 2020-07-24 DIAGNOSIS — I129 Hypertensive chronic kidney disease with stage 1 through stage 4 chronic kidney disease, or unspecified chronic kidney disease: Secondary | ICD-10-CM | POA: Diagnosis not present

## 2020-07-24 DIAGNOSIS — F03918 Unspecified dementia, unspecified severity, with other behavioral disturbance: Secondary | ICD-10-CM | POA: Diagnosis present

## 2020-07-24 DIAGNOSIS — Z7984 Long term (current) use of oral hypoglycemic drugs: Secondary | ICD-10-CM | POA: Insufficient documentation

## 2020-07-24 DIAGNOSIS — Z8673 Personal history of transient ischemic attack (TIA), and cerebral infarction without residual deficits: Secondary | ICD-10-CM | POA: Insufficient documentation

## 2020-07-24 DIAGNOSIS — G301 Alzheimer's disease with late onset: Secondary | ICD-10-CM

## 2020-07-24 DIAGNOSIS — E1122 Type 2 diabetes mellitus with diabetic chronic kidney disease: Secondary | ICD-10-CM | POA: Diagnosis not present

## 2020-07-24 DIAGNOSIS — R456 Violent behavior: Secondary | ICD-10-CM | POA: Insufficient documentation

## 2020-07-24 DIAGNOSIS — Z046 Encounter for general psychiatric examination, requested by authority: Secondary | ICD-10-CM | POA: Diagnosis present

## 2020-07-24 DIAGNOSIS — F0390 Unspecified dementia without behavioral disturbance: Secondary | ICD-10-CM | POA: Diagnosis not present

## 2020-07-24 DIAGNOSIS — R4689 Other symptoms and signs involving appearance and behavior: Secondary | ICD-10-CM | POA: Diagnosis present

## 2020-07-24 DIAGNOSIS — Z79899 Other long term (current) drug therapy: Secondary | ICD-10-CM | POA: Insufficient documentation

## 2020-07-24 MED ORDER — ASPIRIN EC 81 MG PO TBEC: 81.0000 mg | DELAYED_RELEASE_TABLET | Freq: Every day | ORAL | Status: DC

## 2020-07-24 MED ORDER — AMLODIPINE BESYLATE 5 MG PO TABS: 10.0000 mg | ORAL_TABLET | Freq: Every day | ORAL | Status: DC

## 2020-07-24 MED ORDER — GALANTAMINE HYDROBROMIDE ER 8 MG PO CP24
16.0000 mg | ORAL_CAPSULE | Freq: Every day | ORAL | Status: DC
  Filled 2020-07-24: qty 2

## 2020-07-24 MED ORDER — METFORMIN HCL 500 MG PO TABS
500.0000 mg | ORAL_TABLET | Freq: Every day | ORAL | Status: DC
Start: 2020-07-25 — End: 2020-07-25

## 2020-07-24 MED ORDER — ASCORBIC ACID 500 MG PO TABS: 500.0000 mg | ORAL_TABLET | Freq: Every day | ORAL | Status: DC
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
## 2020-07-24 NOTE — ED Notes (Signed)
Multiple attempted to call family member to pick up patient. Unable to speak with Sela Hilding.  HIPPA compliant VM left for return call.

## 2020-07-24 NOTE — Discharge Instructions (Addendum)
You are cleared by psychiatric team for discharge home. Discussed with your primary care doctor additional medications to help prevent aggression
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
## 2020-07-24 NOTE — ED Notes (Signed)
Writer called Ricard Dillon at [facility] about if patient could return to facility. Per Ricard Dillon patient could return with Q15 min checks. Charge RN and Psych NP made aware.

## 2020-07-24 NOTE — ED Notes (Signed)

## 2020-07-25 NOTE — ED Notes (Signed)
Patient has refused vitals  

## 2020-07-25 NOTE — ED Notes (Signed)
Multiple attempts to call cousin to pick up patient. Unable to reach.

## 2020-07-25 NOTE — ED Notes (Signed)
Patient is still waiting on ride.

## 2020-07-25 NOTE — ED Notes (Signed)
E-signature not working at this time. Pt  And caregivers verbalized understanding of D/C instructions, prescriptions and follow up care with no further questions at this time. Pt in NAD and ambulatory at time of D/C. Pt family member in lobby to pick patient up and take back to [facility].

## 2021-03-03 ENCOUNTER — Emergency Department: Payer: No Typology Code available for payment source

## 2021-03-03 ENCOUNTER — Other Ambulatory Visit: Payer: Self-pay

## 2021-03-03 ENCOUNTER — Emergency Department
Admission: EM | Admit: 2021-03-03 | Discharge: 2021-03-03 | Disposition: A | Discharge status: Home / Self Care | Payer: No Typology Code available for payment source | Attending: Student in an Organized Health Care Education/Training Program | Admitting: Student in an Organized Health Care Education/Training Program

## 2021-03-03 DIAGNOSIS — Z7982 Long term (current) use of aspirin: Secondary | ICD-10-CM | POA: Diagnosis not present

## 2021-03-03 DIAGNOSIS — I129 Hypertensive chronic kidney disease with stage 1 through stage 4 chronic kidney disease, or unspecified chronic kidney disease: Secondary | ICD-10-CM | POA: Insufficient documentation

## 2021-03-03 DIAGNOSIS — W500XXA Accidental hit or strike by another person, initial encounter: Secondary | ICD-10-CM | POA: Diagnosis not present

## 2021-03-03 DIAGNOSIS — Z79899 Other long term (current) drug therapy: Secondary | ICD-10-CM | POA: Insufficient documentation

## 2021-03-03 DIAGNOSIS — E1122 Type 2 diabetes mellitus with diabetic chronic kidney disease: Secondary | ICD-10-CM | POA: Diagnosis not present

## 2021-03-03 DIAGNOSIS — W19XXXA Unspecified fall, initial encounter: Secondary | ICD-10-CM

## 2021-03-03 DIAGNOSIS — S0081XA Abrasion of other part of head, initial encounter: Secondary | ICD-10-CM | POA: Insufficient documentation

## 2021-03-03 DIAGNOSIS — Z20822 Contact with and (suspected) exposure to covid-19: Secondary | ICD-10-CM | POA: Diagnosis not present

## 2021-03-03 DIAGNOSIS — S0990XA Unspecified injury of head, initial encounter: Secondary | ICD-10-CM

## 2021-03-03 DIAGNOSIS — N183 Chronic kidney disease, stage 3 unspecified: Secondary | ICD-10-CM | POA: Insufficient documentation

## 2021-03-03 DIAGNOSIS — F0390 Unspecified dementia without behavioral disturbance: Secondary | ICD-10-CM | POA: Insufficient documentation

## 2021-03-03 DIAGNOSIS — Z7984 Long term (current) use of oral hypoglycemic drugs: Secondary | ICD-10-CM | POA: Diagnosis not present

## 2021-03-03 DIAGNOSIS — Y92129 Unspecified place in nursing home as the place of occurrence of the external cause: Secondary | ICD-10-CM | POA: Insufficient documentation

## 2021-03-03 LAB — URINALYSIS, COMPLETE (UACMP) WITH MICROSCOPIC
Bacteria, UA: NONE SEEN
Bilirubin Urine: NEGATIVE
Glucose, UA: NEGATIVE mg/dL
Hgb urine dipstick: NEGATIVE
Ketones, ur: 20 mg/dL — AB
Leukocytes,Ua: NEGATIVE
Nitrite: NEGATIVE
Protein, ur: NEGATIVE mg/dL
Specific Gravity, Urine: 1.013 (ref 1.005–1.030)
pH: 5 (ref 5.0–8.0)

## 2021-03-03 LAB — BASIC METABOLIC PANEL
Anion gap: 9 (ref 5–15)
BUN: 15 mg/dL (ref 8–23)
CO2: 24 mmol/L (ref 22–32)
Calcium: 9 mg/dL (ref 8.9–10.3)
Chloride: 106 mmol/L (ref 98–111)
Creatinine, Ser: 1.04 mg/dL (ref 0.61–1.24)
GFR, Estimated: >60 mL/min (ref >60)
Glucose, Bld: 118 mg/dL — ABNORMAL HIGH (ref 70–99)
Potassium: 4.1 mmol/L (ref 3.5–5.1)
Sodium: 139 mmol/L (ref 135–145)

## 2021-03-03 LAB — CBC
HCT: 40.7 % (ref 39.0–52.0)
Hemoglobin: 14.1 g/dL (ref 13.0–17.0)
MCH: 31.1 pg (ref 26.0–34.0)
MCHC: 34.6 g/dL (ref 30.0–36.0)
MCV: 89.8 fL (ref 80.0–100.0)
Platelets: 195 10*3/uL (ref 150–400)
RBC: 4.53 MIL/uL (ref 4.22–5.81)
RDW: 13.4 % (ref 11.5–15.5)
WBC: 8.9 10*3/uL (ref 4.0–10.5)
nRBC: 0 % (ref 0.0–0.2)

## 2021-03-03 LAB — RESP PANEL BY RT-PCR (FLU A&B, COVID) ARPGX2
Influenza A by PCR: NEGATIVE
Influenza B by PCR: NEGATIVE
SARS Coronavirus 2 by RT PCR: NEGATIVE

## 2021-03-03 LAB — TROPONIN I (HIGH SENSITIVITY)
Troponin I (High Sensitivity): 10 ng/L (ref <18)
Troponin I (High Sensitivity): 10 ng/L (ref <18)
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
## 2021-03-03 NOTE — ED Notes (Signed)
[facility]  CALLED FOR  TRANSPORT  TO  [facility] 
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
## 2021-03-03 NOTE — ED Notes (Signed)
RN gave report to [facility] for pt arrival via [facility].

## 2021-03-05 ENCOUNTER — Other Ambulatory Visit: Payer: Self-pay | Admitting: Adult Health

## 2021-03-05 ENCOUNTER — Ambulatory Visit
Admission: RE | Admit: 2021-03-05 | Discharge: 2021-03-05 | Disposition: A | Discharge status: Home / Self Care | Payer: No Typology Code available for payment source | Attending: Adult Health | Admitting: Adult Health

## 2021-03-05 ENCOUNTER — Ambulatory Visit
Admission: RE | Admit: 2021-03-05 | Discharge: 2021-03-05 | Disposition: A | Discharge status: Home / Self Care | Payer: No Typology Code available for payment source | Source: Ambulatory Visit | Attending: Adult Health | Admitting: Adult Health

## 2021-03-05 DIAGNOSIS — R7611 Nonspecific reaction to tuberculin skin test without active tuberculosis: Secondary | ICD-10-CM

## 2021-06-07 ENCOUNTER — Observation Stay
Admission: EM | Admit: 2021-06-07 | Discharge: 2021-06-09 | Disposition: A | Discharge status: Home / Self Care | Payer: No Typology Code available for payment source | Attending: Internal Medicine | Admitting: Internal Medicine

## 2021-06-07 ENCOUNTER — Other Ambulatory Visit: Payer: Self-pay

## 2021-06-07 ENCOUNTER — Emergency Department: Payer: No Typology Code available for payment source

## 2021-06-07 ENCOUNTER — Encounter: Payer: Self-pay | Admitting: Emergency Medicine

## 2021-06-07 ENCOUNTER — Observation Stay: Payer: No Typology Code available for payment source

## 2021-06-07 DIAGNOSIS — N183 Chronic kidney disease, stage 3 unspecified: Secondary | ICD-10-CM | POA: Diagnosis not present

## 2021-06-07 DIAGNOSIS — K219 Gastro-esophageal reflux disease without esophagitis: Secondary | ICD-10-CM | POA: Diagnosis not present

## 2021-06-07 DIAGNOSIS — I129 Hypertensive chronic kidney disease with stage 1 through stage 4 chronic kidney disease, or unspecified chronic kidney disease: Secondary | ICD-10-CM | POA: Insufficient documentation

## 2021-06-07 DIAGNOSIS — W19XXXA Unspecified fall, initial encounter: Secondary | ICD-10-CM | POA: Diagnosis not present

## 2021-06-07 DIAGNOSIS — G301 Alzheimer's disease with late onset: Secondary | ICD-10-CM

## 2021-06-07 DIAGNOSIS — F0390 Unspecified dementia without behavioral disturbance: Secondary | ICD-10-CM | POA: Diagnosis not present

## 2021-06-07 DIAGNOSIS — Z7984 Long term (current) use of oral hypoglycemic drugs: Secondary | ICD-10-CM | POA: Diagnosis not present

## 2021-06-07 DIAGNOSIS — R55 Syncope and collapse: Secondary | ICD-10-CM | POA: Diagnosis not present

## 2021-06-07 DIAGNOSIS — S0990XA Unspecified injury of head, initial encounter: Secondary | ICD-10-CM | POA: Diagnosis present

## 2021-06-07 DIAGNOSIS — Z20822 Contact with and (suspected) exposure to covid-19: Secondary | ICD-10-CM | POA: Insufficient documentation

## 2021-06-07 DIAGNOSIS — Z79899 Other long term (current) drug therapy: Secondary | ICD-10-CM | POA: Insufficient documentation

## 2021-06-07 DIAGNOSIS — F03918 Unspecified dementia, unspecified severity, with other behavioral disturbance: Secondary | ICD-10-CM | POA: Diagnosis present

## 2021-06-07 DIAGNOSIS — E1122 Type 2 diabetes mellitus with diabetic chronic kidney disease: Secondary | ICD-10-CM | POA: Insufficient documentation

## 2021-06-07 DIAGNOSIS — I639 Cerebral infarction, unspecified: Secondary | ICD-10-CM | POA: Diagnosis present

## 2021-06-07 DIAGNOSIS — Z7982 Long term (current) use of aspirin: Secondary | ICD-10-CM | POA: Insufficient documentation

## 2021-06-07 DIAGNOSIS — I10 Essential (primary) hypertension: Secondary | ICD-10-CM

## 2021-06-07 DIAGNOSIS — E119 Type 2 diabetes mellitus without complications: Secondary | ICD-10-CM | POA: Diagnosis not present

## 2021-06-07 DIAGNOSIS — G4733 Obstructive sleep apnea (adult) (pediatric): Secondary | ICD-10-CM | POA: Diagnosis present

## 2021-06-07 DIAGNOSIS — F02C 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, severe, without behavioral disturbance, psychotic disturbance, mood disturbance, and anxiety: Secondary | ICD-10-CM

## 2021-06-07 DIAGNOSIS — G4730 Sleep apnea, unspecified: Secondary | ICD-10-CM | POA: Diagnosis present

## 2021-06-07 LAB — TROPONIN I (HIGH SENSITIVITY)
Troponin I (High Sensitivity): 16 ng/L (ref <18)
Troponin I (High Sensitivity): 18 ng/L — ABNORMAL HIGH (ref <18)

## 2021-06-07 LAB — CBC WITH DIFFERENTIAL/PLATELET
Abs Immature Granulocytes: 0.02 10*3/uL (ref 0.00–0.07)
Basophils Absolute: 0.1 10*3/uL (ref 0.0–0.1)
Basophils Relative: 1 %
Eosinophils Absolute: 0.2 10*3/uL (ref 0.0–0.5)
Eosinophils Relative: 2 %
HCT: 42 % (ref 39.0–52.0)
Hemoglobin: 14.3 g/dL (ref 13.0–17.0)
Immature Granulocytes: 0 %
Lymphocytes Relative: 37 %
Lymphs Abs: 2.7 10*3/uL (ref 0.7–4.0)
MCH: 29.4 pg (ref 26.0–34.0)
MCHC: 34 g/dL (ref 30.0–36.0)
MCV: 86.4 fL (ref 80.0–100.0)
Monocytes Absolute: 0.5 10*3/uL (ref 0.1–1.0)
Monocytes Relative: 7 %
Neutro Abs: 4 10*3/uL (ref 1.7–7.7)
Neutrophils Relative %: 53 %
Platelets: 219 10*3/uL (ref 150–400)
RBC: 4.86 MIL/uL (ref 4.22–5.81)
RDW: 13.5 % (ref 11.5–15.5)
WBC: 7.5 10*3/uL (ref 4.0–10.5)
nRBC: 0 % (ref 0.0–0.2)

## 2021-06-07 LAB — COMPREHENSIVE METABOLIC PANEL
ALT: 10 U/L (ref 0–44)
AST: 23 U/L (ref 15–41)
Albumin: 4.1 g/dL (ref 3.5–5.0)
Alkaline Phosphatase: 84 U/L (ref 38–126)
Anion gap: 7 (ref 5–15)
BUN: 12 mg/dL (ref 8–23)
CO2: 23 mmol/L (ref 22–32)
Calcium: 9.2 mg/dL (ref 8.9–10.3)
Chloride: 104 mmol/L (ref 98–111)
Creatinine, Ser: 1.09 mg/dL (ref 0.61–1.24)
GFR, Estimated: >60 mL/min (ref >60)
Glucose, Bld: 199 mg/dL — ABNORMAL HIGH (ref 70–99)
Potassium: 3.2 mmol/L — ABNORMAL LOW (ref 3.5–5.1)
Sodium: 134 mmol/L — ABNORMAL LOW (ref 135–145)
Total Bilirubin: 0.7 mg/dL (ref 0.3–1.2)
Total Protein: 8.3 g/dL — ABNORMAL HIGH (ref 6.5–8.1)

## 2021-06-07 LAB — CBG MONITORING, ED: Glucose-Capillary: 159 mg/dL — ABNORMAL HIGH (ref 70–99)

## 2021-06-07 LAB — RESP PANEL BY RT-PCR (FLU A&B, COVID) ARPGX2
Influenza A by PCR: NEGATIVE
Influenza B by PCR: NEGATIVE
SARS Coronavirus 2 by RT PCR: NEGATIVE

## 2021-06-07 LAB — MAGNESIUM: Magnesium: 2 mg/dL (ref 1.7–2.4)

## 2021-06-07 LAB — AMMONIA: Ammonia: 11 umol/L (ref 9–35)

## 2021-06-07 LAB — VALPROIC ACID LEVEL: Valproic Acid Lvl: <10 ug/mL — ABNORMAL LOW (ref 50.0–100.0)

## 2021-06-07 MED ORDER — INSULIN ASPART 100 UNIT/ML IJ SOLN
0.0000 [IU] | Freq: Three times a day (TID) | INTRAMUSCULAR | Status: DC
  Administered 2021-06-08: 17:00:00 2 [IU] via SUBCUTANEOUS
  Administered 2021-06-08: 12:00:00 3 [IU] via SUBCUTANEOUS
  Administered 2021-06-09: 13:00:00 5 [IU] via SUBCUTANEOUS
  Filled 2021-06-07 (×3): qty 1

## 2021-06-07 MED ORDER — ACETAMINOPHEN 160 MG/5ML PO SOLN
650.0000 mg | ORAL | Status: DC | PRN
  Filled 2021-06-07: qty 20.3

## 2021-06-07 MED ORDER — ONDANSETRON 4 MG PO TBDP: 4.0000 mg | ORAL_TABLET | Freq: Once | ORAL | Status: DC

## 2021-06-07 MED ORDER — ACETAMINOPHEN 650 MG RE SUPP: 650.0000 mg | RECTAL | Status: DC | PRN

## 2021-06-07 MED ORDER — ENOXAPARIN SODIUM 40 MG/0.4ML IJ SOSY
40.0000 mg | PREFILLED_SYRINGE | INTRAMUSCULAR | Status: DC
  Administered 2021-06-07: 40 mg via SUBCUTANEOUS
  Filled 2021-06-07: qty 0.4

## 2021-06-07 MED ORDER — LABETALOL HCL 5 MG/ML IV SOLN: 5.0000 mg | INTRAVENOUS | Status: AC | PRN

## 2021-06-07 MED ORDER — ACETAMINOPHEN 325 MG PO TABS: 650.0000 mg | ORAL_TABLET | ORAL | Status: DC | PRN

## 2021-06-07 MED ORDER — ASPIRIN 325 MG PO TABS
325.0000 mg | ORAL_TABLET | Freq: Every day | ORAL | Status: DC
  Administered 2021-06-07 – 2021-06-09 (×3): 325 mg via ORAL
  Filled 2021-06-07 (×3): qty 1

## 2021-06-07 MED ORDER — SODIUM CHLORIDE 0.9 % IV SOLN
12.5000 mg | Freq: Three times a day (TID) | INTRAVENOUS | Status: DC | PRN
  Filled 2021-06-07: qty 0.5

## 2021-06-07 MED ORDER — STROKE: EARLY STAGES OF RECOVERY BOOK: Freq: Once | Status: DC

## 2021-06-07 MED ORDER — INSULIN ASPART 100 UNIT/ML IJ SOLN: 0.0000 [IU] | Freq: Every day | INTRAMUSCULAR | Status: DC

## 2021-06-07 MED ORDER — ONDANSETRON HCL 4 MG/2ML IJ SOLN: 4.0000 mg | Freq: Four times a day (QID) | INTRAMUSCULAR | Status: DC | PRN

## 2021-06-07 MED ORDER — ONDANSETRON HCL 4 MG PO TABS: 4.0000 mg | ORAL_TABLET | Freq: Three times a day (TID) | ORAL | Status: DC | PRN

## 2021-06-07 MED ORDER — SENNOSIDES-DOCUSATE SODIUM 8.6-50 MG PO TABS: 1.0000 | ORAL_TABLET | Freq: Every evening | ORAL | Status: DC | PRN

## 2021-06-07 MED ORDER — ASPIRIN 300 MG RE SUPP: 300.0000 mg | Freq: Every day | RECTAL | Status: DC

## 2021-06-07 NOTE — ED Triage Notes (Signed)
FIRST NURSE NOTE:  Pt arrived via [facility] from [facility], had witnessed fall by staff while at nurses station. Pt reports he hit his head. Denies any pain, pt does not remember falling.  Pt states he let himself down and laid in the floor, pt has hx of dementia. VSS with EMS.

## 2021-06-07 NOTE — H&P (Signed)
History and Physical   Stanley Nielsen NWG:[id number] DOB: May 11, 1941 DOA: 06/07/2021  PCP: Martie Round, NP  Patient coming from: Nursing facility  I have personally briefly reviewed patient's old medical records in [facility] EMR.  Chief Concern: Syncopal event  HPI: Stanley Nielsen is a 80 y.o. male with medical history significant for hypertension, insomnia, non-insulin-dependent diabetes mellitus, dementia, history of cerebral infarction, gout in remission, sleep apnea, CPAP dependent, depression, gout, hyperlipidemia, who presents emergency department for chief concerns of syncopal event.  At bedside he is able to tell me his name. He was not able to tell me his age after many moments of contemplation. He was not able to tell me the current year and his current location. When I asked him where he is, he states, 'I am here, I don't know what you call it, I am here.' When I asked him does he know why he is in the hospital, he states, ' Because you think I need to be here.'  Who do you live with? I live with my friends, people. My mom and dad have passed away in the last few months.   What do you do for a living? 'As little as possible'  Social history: He denies never smoking. He endorses etoh use.   Vaccination history: Unknown  ROS: Unable to complete as patient appears to have advanced dementia  ED Course: Discussed with emergency medicine provider, patient requiring hospitalization for chief concerns of a syncopal events.  Vitals in the emergency department was remarkable for temperature of 98.4, respiration rate of 18, heart rate of 67, blood pressure 149/96, SPO2 of 97% on room air.  Serum sodium level was 134, potassium 3.2, chloride 104, bicarb 23, BUN of 12, serum creatinine of 1.09, nonfasting blood glucose 199, GFR greater than 60.  WBC 7.5, hemoglobin 14.3, platelets 219.  High sensitive troponin was 16.  Valproic acid level was negative.  In the  emergency department, while in the waiting room, patient had 1 episode of emesis.  Zofran 4 mg p.o. was ordered for patient.  Assessment/Plan  Principal Problem:   Syncope Active Problems:   Dementia (HCC)   Hypertension   GERD (gastroesophageal reflux disease)   Ischemic stroke (HCC)   Diabetes mellitus type 2 in nonobese (HCC)   OSA (obstructive sleep apnea)   # Syncopal event-etiology work-up in progress, query TIA versus stroke - A.m. team to consult neurology service pending MRI - Complete echo, B12, B1, magnesium - MRI of the brain ordered - Fasting lipid and A1c ordered in the a.m. - Permissive hypertension with labetalol - Frequent neuro vascular checks - Heart healthy/carb modified - PT, OT, SLP - Fall precaution   # History of hypertension - Permissive hypertension - Labetalol 5 mg IV every 2 hours as needed for SBP greater than 185, 40 hours ordered  # Non-insulin-dependent diabetes mellitus - Insulin SSI with at bedtime coverage - Goal inpatient blood glucose level is 140-180  # OSA-CPAP dependent - CPAP nightly ordered with notes to respiratory staff to let provider know if CPAP is not available and can order high flow nasal cannula  # History of dementia  # GERD  Chart reviewed.   DVT prophylaxis: Enoxaparin 40 mg subcutaneous Code Status: Full code Diet: Heart healthy/carb modified Family Communication: No Disposition Plan: Pending clinical course Consults called: None at this time Admission status: Telemetry medical, observation  Past Medical History:  Diagnosis Date   CKD (chronic kidney disease), stage III (  [place])    Dementia ([place])    Depression    Gout    HOH (hard of hearing)    Hypertension    Sleep apnea    Vertigo    Past Surgical History:  Procedure Laterality Date   CATARACT EXTRACTION W/PHACO Left 04/30/2015   Procedure: CATARACT EXTRACTION PHACO AND INTRAOCULAR LENS PLACEMENT ([place]);  Surgeon: Birder Robson, MD;  Location: [facility]  ORS;  Service: Ophthalmology;  Laterality: Left;  [place] 00:47 AP% 23.5 CDE 11.16 fluid pack lot # [id number]   COLONOSCOPY     Social History:  reports that he has never smoked. He has never used smokeless tobacco. He reports current alcohol use. He reports that he does not use drugs.  Allergies  Allergen Reactions   Penicillins Hives    Has patient had a PCN reaction causing immediate rash, facial/tongue/throat swelling, SOB or lightheadedness with hypotension: No Has patient had a PCN reaction causing severe rash involving mucus membranes or skin necrosis: No Has patient had a PCN reaction that required hospitalization: No Has patient had a PCN reaction occurring within the last 10 years: No If all of the above answers are "NO", then may proceed with Cephalosporin use.   Sulfa Antibiotics Nausea And Vomiting   Family History  Problem Relation Age of Onset   Dementia Mother    Alcoholism Father    Family history: Family history reviewed and pertinent for dementia in mother.  Prior to Admission medications   Medication Sig Start Date End Date Taking? Authorizing Provider  amLODipine (NORVASC) 10 MG tablet Take 10 mg by mouth daily.     [provider]  ascorbic acid (VITAMIN C) 500 MG tablet Take 500 mg by mouth daily.    [provider]  aspirin EC 81 MG tablet Take 81 mg by mouth daily.     [provider]  carboxymethylcellul-glycerin (REFRESH OPTIVE) 0.5-0.9 % ophthalmic solution Place 1 drop into both eyes every 6 (six) hours as needed for dry eyes. Every 6 hrs     [provider]  cyanocobalamin 1000 MCG tablet Take 1 tablet by mouth daily. 11/25/20   [provider]  divalproex (DEPAKOTE SPRINKLE) 125 MG capsule Take 125 mg by mouth 2 (two) times daily. 01/13/21   [provider]  docusate sodium (COLACE) 100 MG capsule Take 100 mg by mouth 2 (two) times daily.    [provider]  fluocinonide-emollient (LIDEX-E) 0.05 %  cream Use as directed Patient not taking: Reported on 03/03/2021 12/18/19   Gardiner Barefoot, DPM  galantamine (RAZADYNE ER) 16 MG 24 hr capsule Take 16 mg by mouth daily with breakfast.     [provider]  metFORMIN (GLUCOPHAGE) 500 MG tablet Take 500 mg by mouth 2 (two) times daily with a meal.    [provider]  mineral oil enema Place 1 enema rectally as needed for mild constipation.     [provider]  MIRALAX 17 GM/SCOOP powder Take 17 g by mouth daily as needed for mild constipation.  10/24/19   [provider]  mirtazapine (REMERON) 7.5 MG tablet Take 1 tablet by mouth at bedtime. 12/17/20   [provider]  Multiple Vitamins-Minerals (MULTIVITAMIN WITH MINERALS) tablet Take 1 tablet by mouth daily.    [provider]  omeprazole (PRILOSEC) 20 MG capsule Take 20 mg by mouth daily.    [provider]  QUEtiapine (SEROQUEL) 25 MG tablet Take 1 tablet by mouth at bedtime. 02/26/21  [provider]  valsartan (DIOVAN) 160 MG tablet Take 160 mg by mouth daily.    [provider]   Physical Exam: Vitals:   06/07/21 1918 06/07/21 1922 06/07/21 1929  BP: 99/85 (!) 149/96   Pulse: 67    Resp: 18    Temp: 98.4 F (36.9 C)    TempSrc: Oral    SpO2: 97%    Weight:   88 kg   Constitutional: appears age-appropriate, frail, NAD, calm, comfortable Eyes: PERRL, lids and conjunctivae normal ENMT: Mucous membranes are moist. Posterior pharynx clear of any exudate or lesions. Age-appropriate dentition.  Mild hearing loss Neck: normal, supple, no masses, no thyromegaly Respiratory: clear to auscultation bilaterally, no wheezing, no crackles. Normal respiratory effort. No accessory muscle use.  Cardiovascular: Regular rate and rhythm, no murmurs / rubs / gallops. No extremity edema. 2+ pedal pulses. No carotid bruits.  Abdomen: no tenderness, no masses palpated, no hepatosplenomegaly. Bowel sounds positive.  Musculoskeletal:  no clubbing / cyanosis. No joint deformity upper and lower extremities. Good ROM, no contractures, no atrophy. Normal muscle tone.  Skin: no rashes, lesions, ulcers. No induration Neurologic: Sensation intact. Strength 5/5 in all 4.  Psychiatric: Normal judgment and insight. Alert and oriented x self. Normal mood.   EKG: independently reviewed, showing sinus rhythm with rate of 67, QTc 336  Chest x-ray on Admission: I personally reviewed and I agree with radiologist reading as below.  DG Chest 2 View  Result Date: 06/07/2021 CLINICAL DATA:  Fall injury, syncopal episode. EXAM: CHEST - 2 VIEW COMPARISON:  Portable chest 03/05/2021 FINDINGS: The heart size and mediastinal contours are within normal limits. Both lungs are clear. The visualized skeletal structures are unremarkable except for osteopenia and bridging enthesopathy of the thoracic spine. Mild chronic elevation right hemidiaphragm with anterior eventration. IMPRESSION: No active cardiopulmonary disease or interval changes. Electronically Signed   By: Telford Nab M.D.   On: 06/07/2021 20:05   CT Head Wo Contrast  Result Date: 06/07/2021 CLINICAL DATA:  Status post fall. EXAM: CT HEAD WITHOUT CONTRAST TECHNIQUE: Contiguous axial images were obtained from the base of the skull through the vertex without intravenous contrast. COMPARISON:  March 03, 2021 FINDINGS: Brain: There is moderate severity cerebral atrophy with widening of the extra-axial spaces and ventricular dilatation. There are areas of decreased attenuation within the white matter tracts of the supratentorial brain, consistent with microvascular disease changes. 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 Vascular: No hyperdense vessel or unexpected calcification. Skull: Normal. Negative for fracture or focal lesion. Sinuses/Orbits: No acute finding. Other: None. IMPRESSION: 1. Moderate severity cerebral atrophy. 2. Small, chronic cerebellar infarcts. 3. No acute intracranial  abnormality. Electronically Signed   By: Virgina Norfolk M.D.   On: 06/07/2021 20:19   CT Cervical Spine Wo Contrast  Result Date: 06/07/2021 CLINICAL DATA:  Status post fall. EXAM: CT CERVICAL SPINE WITHOUT CONTRAST TECHNIQUE: Multidetector CT imaging of the cervical spine was performed without intravenous contrast. Multiplanar CT image reconstructions were also generated. COMPARISON:  March 03, 2021 FINDINGS: Alignment: Normal. Skull base and vertebrae: No acute fracture. No primary bone lesion or focal pathologic process. Soft tissues and spinal canal: No prevertebral fluid or swelling. No visible canal hematoma. Disc levels: Moderate severity endplate sclerosis is seen at the levels of C3-C4, C4-C5, C5-C6 and C6-C7. Moderate severity multilevel intervertebral disc space narrowing is seen throughout all levels of the cervical spine. Bilateral moderate severity multilevel facet joint hypertrophy is noted. Upper chest: Negative. Other: None. IMPRESSION: 1.  Moderate severity multilevel degenerative changes, as described above. 2. No evidence of an acute fracture or subluxation. Electronically Signed   By: Virgina Norfolk M.D.   On: 06/07/2021 20:24    Labs on Admission: I have personally reviewed following labs  CBC: Recent Labs  Lab 06/07/21 1926  WBC 7.5  NEUTROABS 4.0  HGB 14.3  HCT 42.0  MCV 86.4  PLT [id number]   Basic Metabolic Panel: Recent Labs  Lab 06/07/21 1926  NA 134*  K 3.2*  CL 104  CO2 23  GLUCOSE 199*  BUN 12  CREATININE 1.09  CALCIUM 9.2   GFR: Estimated Creatinine Clearance: 59.3 mL/min (by C-G formula based on SCr of 1.09 mg/dL).  Liver Function Tests: Recent Labs  Lab 06/07/21 1926  AST 23  ALT 10  ALKPHOS 84  BILITOT 0.7  PROT 8.3*  ALBUMIN 4.1   Urine analysis:    Component Value Date/Time   COLORURINE YELLOW (A) 03/03/2021 1534   APPEARANCEUR HAZY (A) 03/03/2021 1534   LABSPEC 1.013 03/03/2021 1534   PHURINE 5.0 03/03/2021 1534   GLUCOSEU  NEGATIVE 03/03/2021 1534   HGBUR NEGATIVE 03/03/2021 1534   BILIRUBINUR NEGATIVE 03/03/2021 1534   KETONESUR 20 (A) 03/03/2021 1534   PROTEINUR NEGATIVE 03/03/2021 1534   NITRITE NEGATIVE 03/03/2021 1534   LEUKOCYTESUR NEGATIVE 03/03/2021 1534   Dr. Tobie Poet [facility]  If 7PM-7AM, please contact overnight-coverage provider If 7AM-7PM, please contact day coverage provider [email or web address]  06/07/2021, 10:16 PM

## 2021-06-07 NOTE — ED Notes (Signed)
Pt just had episode of emesis in lobby, while sitting in wheelchair

## 2021-06-07 NOTE — ED Provider Notes (Signed)
[facility] Emergency Department Provider Note   ____________________________________________   Event Date/Time   First MD Initiated Contact with Patient 06/07/21 1924     (approximate)  I have reviewed the triage vital signs and the nursing notes.   HISTORY  Chief Complaint Fall   HPI Stanley Nielsen is a 80 y.o. male brought from [place].  Reportedly the patient was standing talking to the nursing staff and passed out and fell down and hit his head.  Patient says he did have an episode of nausea but does not remember the fall.  He does remember being on the floor.  He says he does not have any headache or chest pain or neck pain or anything.  Is closed and not dirty or wrinkled or disheveled like he fell on the floor.  Patient reports he feels fine now.  The nausea that he had is gone.        Past Medical History:  Diagnosis Date   CKD (chronic kidney disease), stage III ([place])    Dementia ([place])    Depression    Gout    HOH (hard of hearing)    Hypertension    Sleep apnea    Vertigo     Patient Active Problem List   Diagnosis Date Noted   Syncope 06/07/2021   Aggressive behavior 05/05/2020   Pain due to onychomycosis of toenails of both feet 12/18/2019   Dermatitis 12/18/2019   Diabetes mellitus type 2 in nonobese [facility])    Stage 3 chronic kidney disease (HCC)    OSA (obstructive sleep apnea)    Diastolic dysfunction    Hypokalemia    GERD (gastroesophageal reflux disease) 03/26/2017   Acute renal failure superimposed on stage 3 chronic kidney disease ([place]) 03/26/2017   Ischemic stroke ([place]) 123XX123   Acute metabolic encephalopathy 123XX123   Type II diabetes mellitus with renal manifestations ([place]) 03/26/2017   Dementia ([place])    Hypertension     Past Surgical History:  Procedure Laterality Date   CATARACT EXTRACTION W/PHACO Left 04/30/2015   Procedure: CATARACT EXTRACTION PHACO AND INTRAOCULAR LENS PLACEMENT (IOC);   Surgeon: Birder Robson, MD;  Location: [facility] ORS;  Service: Ophthalmology;  Laterality: Left;  [place] 00:47 AP% 23.5 CDE 11.16 fluid pack lot # [id number]   COLONOSCOPY      Prior to Admission medications   Medication Sig Start Date End Date Taking? Authorizing Provider  amLODipine (NORVASC) 10 MG tablet Take 10 mg by mouth daily.     [provider]  ascorbic acid (VITAMIN C) 500 MG tablet Take 500 mg by mouth daily.    [provider]  aspirin EC 81 MG tablet Take 81 mg by mouth daily.     [provider]  carboxymethylcellul-glycerin (REFRESH OPTIVE) 0.5-0.9 % ophthalmic solution Place 1 drop into both eyes every 6 (six) hours as needed for dry eyes. Every 6 hrs     [provider]  cyanocobalamin 1000 MCG tablet Take 1 tablet by mouth daily. 11/25/20   [provider]  divalproex (DEPAKOTE SPRINKLE) 125 MG capsule Take 125 mg by mouth 2 (two) times daily. 01/13/21   [provider]  docusate sodium (COLACE) 100 MG capsule Take 100 mg by mouth 2 (two) times daily.    [provider]  fluocinonide-emollient (LIDEX-E) 0.05 % cream Use as directed Patient not taking: Reported on 03/03/2021 12/18/19   Gardiner Barefoot, DPM  galantamine (RAZADYNE ER) 16 MG 24 hr capsule  Take 16 mg by mouth daily with breakfast.     [provider]  metFORMIN (GLUCOPHAGE) 500 MG tablet Take 500 mg by mouth 2 (two) times daily with a meal.    [provider]  mineral oil enema Place 1 enema rectally as needed for mild constipation.     [provider]  MIRALAX 17 GM/SCOOP powder Take 17 g by mouth daily as needed for mild constipation.  10/24/19   [provider]  mirtazapine (REMERON) 7.5 MG tablet Take 1 tablet by mouth at bedtime. 12/17/20   [provider]  Multiple Vitamins-Minerals (MULTIVITAMIN WITH MINERALS) tablet Take 1 tablet by mouth daily.    [provider]  omeprazole (PRILOSEC) 20 MG capsule  Take 20 mg by mouth daily.    [provider]  QUEtiapine (SEROQUEL) 25 MG tablet Take 1 tablet by mouth at bedtime. 02/26/21   [provider]  valsartan (DIOVAN) 160 MG tablet Take 160 mg by mouth daily.    [provider]    Allergies Penicillins and Sulfa antibiotics  Family History  Problem Relation Age of Onset   Dementia Mother    Alcoholism Father     Social History Social History   Tobacco Use   Smoking status: Never   Smokeless tobacco: Never  Substance Use Topics   Alcohol use: Yes    Comment: OCC   Drug use: No    Review of Systems  Constitutional: No fever/chills Eyes: No visual changes. ENT: No sore throat. Cardiovascular: Denies chest pain. Respiratory: Denies shortness of breath. Gastrointestinal: No abdominal pain.  No nausea, no vomiting.  No diarrhea.  No constipation. Genitourinary: Negative for dysuria. Musculoskeletal: Negative for back pain. Skin: Negative for rash. Neurological: Negative for headaches, focal weakness   ____________________________________________   PHYSICAL EXAM:  VITAL SIGNS: ED Triage Vitals [06/07/21 1918]  Enc Vitals Group     BP 99/85     Pulse Rate 67     Resp 18     Temp 98.4 F (36.9 C)     Temp Source Oral     SpO2 97 %     Weight      Height      Head Circumference      Peak Flow      Pain Score      Pain Loc      Pain Edu?      Excl. in GC?     Constitutional: Alert and oriented. Well appearing and in no acute distress. Eyes: Conjunctivae are normal. PER. EOMI. Head: Atraumatic. Nose: No congestion/rhinnorhea. Mouth/Throat: Mucous membranes are moist.  Oropharynx non-erythematous. Neck: No stridor.  No cervical spine tenderness to palpation. Cardiovascular: Normal rate, regular rhythm. Grossly normal heart sounds.  Good peripheral circulation. Respiratory: Normal respiratory effort.  No retractions. Lungs CTAB. Gastrointestinal: Soft and nontender. No distention. No  abdominal bruits.  Musculoskeletal: No lower extremity tenderness nor edema.  Neurologic:  Normal speech and language. No gross focal neurologic deficits are appreciated. No gait instability.  Patient walks normally Skin:  Skin is warm, dry and intact. No rash noted.   ____________________________________________   LABS (all labs ordered are listed, but only abnormal results are displayed)  Labs Reviewed  COMPREHENSIVE METABOLIC PANEL - Abnormal; Notable for the following components:      Result Value   Sodium 134 (*)    Potassium 3.2 (*)    Glucose, Bld 199 (*)    Total Protein 8.3 (*)  All other components within normal limits  VALPROIC ACID LEVEL - Abnormal; Notable for the following components:   Valproic Acid Lvl <10 (*)    All other components within normal limits  CBG MONITORING, ED - Abnormal; Notable for the following components:   Glucose-Capillary 159 (*)    All other components within normal limits  TROPONIN I (HIGH SENSITIVITY) - Abnormal; Notable for the following components:   Troponin I (High Sensitivity) 18 (*)    All other components within normal limits  RESP PANEL BY RT-PCR (FLU A&B, COVID) ARPGX2  CBC WITH DIFFERENTIAL/PLATELET  AMMONIA  MAGNESIUM  URINALYSIS, ROUTINE W REFLEX MICROSCOPIC  URINE DRUG SCREEN, QUALITATIVE ([facility] ONLY)  HEMOGLOBIN A1C  LIPID PANEL  VITAMIN B12  VITAMIN B1  TROPONIN I (HIGH SENSITIVITY)   ____________________________________________  EKG  EKG read interpreted by me shows normal sinus rhythm rate of 97 left axis nonspecific ST-T wave changes ____________________________________________  RADIOLOGY Gertha Calkin, personally viewed and evaluated these images (plain radiographs) as part of my medical decision making, as well as reviewing the written report by the radiologist.  ED MD interpretation: CT of the head C-spine and chest x-ray were read by radiology and no acute changes were seen.  I reviewed the films  and agree.  Official radiology report(s): DG Chest 2 View  Result Date: 06/07/2021 CLINICAL DATA:  Fall injury, syncopal episode. EXAM: CHEST - 2 VIEW COMPARISON:  Portable chest 03/05/2021 FINDINGS: The heart size and mediastinal contours are within normal limits. Both lungs are clear. The visualized skeletal structures are unremarkable except for osteopenia and bridging enthesopathy of the thoracic spine. Mild chronic elevation right hemidiaphragm with anterior eventration. IMPRESSION: No active cardiopulmonary disease or interval changes. Electronically Signed   By: Telford Nab M.D.   On: 06/07/2021 20:05   CT Head Wo Contrast  Result Date: 06/07/2021 CLINICAL DATA:  Status post fall. EXAM: CT HEAD WITHOUT CONTRAST TECHNIQUE: Contiguous axial images were obtained from the base of the skull through the vertex without intravenous contrast. COMPARISON:  March 03, 2021 FINDINGS: Brain: There is moderate severity cerebral atrophy with widening of the extra-axial spaces and ventricular dilatation. There are areas of decreased attenuation within the white matter tracts of the supratentorial brain, consistent with microvascular disease changes. 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 Vascular: No hyperdense vessel or unexpected calcification. Skull: Normal. Negative for fracture or focal lesion. Sinuses/Orbits: No acute finding. Other: None. IMPRESSION: 1. Moderate severity cerebral atrophy. 2. Small, chronic cerebellar infarcts. 3. No acute intracranial abnormality. Electronically Signed   By: Virgina Norfolk M.D.   On: 06/07/2021 20:19   CT Cervical Spine Wo Contrast  Result Date: 06/07/2021 CLINICAL DATA:  Status post fall. EXAM: CT CERVICAL SPINE WITHOUT CONTRAST TECHNIQUE: Multidetector CT imaging of the cervical spine was performed without intravenous contrast. Multiplanar CT image reconstructions were also generated. COMPARISON:  March 03, 2021 FINDINGS: Alignment: Normal. Skull base  and vertebrae: No acute fracture. No primary bone lesion or focal pathologic process. Soft tissues and spinal canal: No prevertebral fluid or swelling. No visible canal hematoma. Disc levels: Moderate severity endplate sclerosis is seen at the levels of C3-C4, C4-C5, C5-C6 and C6-C7. Moderate severity multilevel intervertebral disc space narrowing is seen throughout all levels of the cervical spine. Bilateral moderate severity multilevel facet joint hypertrophy is noted. Upper chest: Negative. Other: None. IMPRESSION: 1. Moderate severity multilevel degenerative changes, as described above. 2. No evidence of an acute fracture or subluxation. Electronically Signed   By:  Virgina Norfolk M.D.   On: 06/07/2021 20:24   MR BRAIN WO CONTRAST  Result Date: 06/07/2021 CLINICAL DATA:  Syncope, presyncope EXAM: MRI HEAD WITHOUT CONTRAST TECHNIQUE: Multiplanar, multiecho pulse sequences of the brain and surrounding structures were obtained without intravenous contrast. COMPARISON:  MRI 07/30/2019, correlation is also made with CT 06/07/2021 FINDINGS: Brain: No restricted diffusion to suggest acute or subacute infarct. No acute hemorrhage, mass, mass effect, or midline shift. No hydrocephalus or extra-axial collection. Cerebral atrophy, somewhat advanced for age, which appears most prominent in the bilateral frontal and temporal lobes. Sequela of prior cerebellar infarcts and right caudate head infarct; left cerebellar infarcts are new compared to 2021 but are not acute and were present on the 03/03/2021 CT. Confluent T2 hyperintense signal in the periventricular white matter, likely the sequela of moderate to severe chronic small vessel ischemic disease. No foci of hemosiderin deposition to suggest remote hemorrhage or superficial siderosis. Vascular: Normal flow voids. Skull and upper cervical spine: Normal marrow signal. Sinuses/Orbits: Mucosal thickening in the right frontal sinus and ethmoid air cells, with a mucous  retention cyst in the left maxillary sinus. Status post bilateral lens replacements. Other: Fluid in the left mastoid tip. IMPRESSION: 1.  No acute intracranial process. 2. Global cerebral atrophy, which appears most prominent in the bilateral frontal and temporal lobes. Electronically Signed   By: Merilyn Baba M.D.   On: 06/07/2021 22:56    ____________________________________________   PROCEDURES  Procedure(s) performed (including Critical Care):  Procedures   ____________________________________________   INITIAL IMPRESSION / ASSESSMENT AND PLAN / ED COURSE   ----------------------------------------- 9:44 PM on 06/07/2021 ----------------------------------------- Patient has vomited once in the lobby.  He is not having any abdominal pain.  He still does not remember the passing out although he was witnessed to have passed out.  This appears to be an episode of syncope.  Patient had no seizure activity seen.  Think we will try to observe him overnight for the syncope.  With no noticed seizure activity seizure is unlikely.  However it could be a cardiac cath arrhythmia.  There is no apparent sign of sepsis or hypoxia to have caused any kind of syncope and no apparent neurological findings to have caused any kind of syncope either so cardiac syncope would be the most likely thing to have happened to him while he was standing and talking.           ____________________________________________   FINAL CLINICAL IMPRESSION(S) / ED DIAGNOSES  Final diagnoses:  Fall, initial encounter  Syncope and collapse     ED Discharge Orders     None        Note:  This document was prepared using Dragon voice recognition software and may include unintentional dictation errors.    Nena Polio, MD 06/08/21 [phone]

## 2021-06-07 NOTE — ED Triage Notes (Signed)
See first nurse note for add'l report. Pt a&ox4 during triage, does not recall fall. This RN called facility and spoke to [place], [place]. She states she witnessed the fall. Pt was standing, talking on phone. She states he passed out, landing onto crown of head. She states he came to his normal self within 30 seconds. No thinners or other injuries reported. GCS 15

## 2021-06-08 ENCOUNTER — Encounter: Payer: Self-pay | Admitting: Internal Medicine

## 2021-06-08 ENCOUNTER — Observation Stay
Admit: 2021-06-08 | Discharge: 2021-06-08 | Disposition: A | Discharge status: Home / Self Care | Payer: No Typology Code available for payment source | Attending: Internal Medicine | Admitting: Internal Medicine

## 2021-06-08 DIAGNOSIS — K219 Gastro-esophageal reflux disease without esophagitis: Secondary | ICD-10-CM | POA: Diagnosis not present

## 2021-06-08 DIAGNOSIS — G301 Alzheimer's disease with late onset: Secondary | ICD-10-CM | POA: Diagnosis not present

## 2021-06-08 DIAGNOSIS — R55 Syncope and collapse: Secondary | ICD-10-CM | POA: Diagnosis not present

## 2021-06-08 DIAGNOSIS — F02C 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, severe, without behavioral disturbance, psychotic disturbance, mood disturbance, and anxiety: Secondary | ICD-10-CM | POA: Diagnosis not present

## 2021-06-08 DIAGNOSIS — E119 Type 2 diabetes mellitus without complications: Secondary | ICD-10-CM | POA: Diagnosis not present

## 2021-06-08 LAB — ECHOCARDIOGRAM COMPLETE
AR max vel: 1.9 cm2
AV Peak grad: 9.9 mmHg
Ao pk vel: 1.58 m/s
Area-P 1/2: 2.48 cm2
P 1/2 time: 683 msec
S' Lateral: 3.1 cm
Weight: 3104.08 oz

## 2021-06-08 LAB — LIPID PANEL
Cholesterol: 169 mg/dL (ref 0–200)
HDL: 34 mg/dL — ABNORMAL LOW (ref >40)
LDL Cholesterol: 113 mg/dL — ABNORMAL HIGH (ref 0–99)
Total CHOL/HDL Ratio: 5 RATIO
Triglycerides: 112 mg/dL (ref <150)
VLDL: 22 mg/dL (ref 0–40)

## 2021-06-08 LAB — CBG MONITORING, ED
Glucose-Capillary: 111 mg/dL — ABNORMAL HIGH (ref 70–99)
Glucose-Capillary: 153 mg/dL — ABNORMAL HIGH (ref 70–99)
Glucose-Capillary: 149 mg/dL — ABNORMAL HIGH (ref 70–99)

## 2021-06-08 LAB — GLUCOSE, CAPILLARY: Glucose-Capillary: 132 mg/dL — ABNORMAL HIGH (ref 70–99)

## 2021-06-08 LAB — VITAMIN B12: Vitamin B-12: 505 pg/mL (ref 180–914)

## 2021-06-08 MED ORDER — QUETIAPINE FUMARATE 25 MG PO TABS
50.0000 mg | ORAL_TABLET | Freq: Every day | ORAL | Status: DC
  Administered 2021-06-08: 21:00:00 50 mg via ORAL
  Filled 2021-06-08: qty 2

## 2021-06-08 MED ORDER — TAMSULOSIN HCL 0.4 MG PO CAPS
0.4000 mg | ORAL_CAPSULE | Freq: Every day | ORAL | Status: DC
  Administered 2021-06-08 – 2021-06-09 (×2): 0.4 mg via ORAL
  Filled 2021-06-08 (×2): qty 1

## 2021-06-08 MED ORDER — DIVALPROEX SODIUM 250 MG PO DR TAB
250.0000 mg | DELAYED_RELEASE_TABLET | Freq: Every day | ORAL | Status: DC
  Administered 2021-06-08 – 2021-06-09 (×2): 250 mg via ORAL
  Filled 2021-06-08 (×2): qty 1

## 2021-06-08 MED ORDER — GALANTAMINE HYDROBROMIDE ER 8 MG PO CP24
16.0000 mg | ORAL_CAPSULE | Freq: Every day | ORAL | Status: DC
  Administered 2021-06-08 – 2021-06-09 (×2): 16 mg via ORAL
  Filled 2021-06-08 (×4): qty 2

## 2021-06-08 MED ORDER — MELATONIN 5 MG PO TABS
2.5000 mg | ORAL_TABLET | Freq: Every day | ORAL | Status: DC
  Administered 2021-06-08: 21:00:00 2.5 mg via ORAL
  Filled 2021-06-08 (×2): qty 0.5

## 2021-06-08 MED ORDER — LORAZEPAM 0.5 MG PO TABS
0.5000 mg | ORAL_TABLET | Freq: Three times a day (TID) | ORAL | Status: DC | PRN
  Administered 2021-06-08: 14:00:00 0.5 mg via ORAL
  Filled 2021-06-08: qty 1

## 2021-06-08 MED ORDER — MIRTAZAPINE 15 MG PO TABS
15.0000 mg | ORAL_TABLET | Freq: Every day | ORAL | Status: DC
  Administered 2021-06-08: 21:00:00 15 mg via ORAL
  Filled 2021-06-08: qty 1

## 2021-06-08 NOTE — Progress Notes (Signed)
*  PRELIMINARY RESULTS* Echocardiogram 2D Echocardiogram has been performed.  Lenor Coffin 06/08/2021, 11:31 AM

## 2021-06-08 NOTE — Evaluation (Signed)
Physical Therapy Evaluation Patient Details Name: Stanley Nielsen MRN: [id number] DOB: 05-28-1941 Today's Date: 06/08/2021  History of Present Illness  Pt admitted to [facility] on 06/07/21 under observation for syncopal event, that resulted in mechanical fall with pt hitting head. Pt endorses nausea but does not remember syncopal event/fall. Imaging negative for acute changes. Significant PMH includes: HTN, insomnia, NIDDM, dementia, hx cerebral infarction, gout, sleep apnea, CPAP dependent, depression, HLD.   Clinical Impression  Pt is a 80 year old M admitted to hospital on 06/07/21 for syncopal event. Pt questionable historian of health due to baseline dementia. At baseline, pt was Ind with ADL's and ambulation without AD; staff at ALF to assist with IADL's. Pt presents with adequate strength, good balance, good activity tolerance, and poor safety awareness. Pt grossly Ind-Mod I for all mobility without AD. 4-item DGI score of 10/12 indicates pt is at low risk of falls. Poor safety awareness likely due to dementia. Orthostatics negative and pt asymptomatic during session. PT to sign off as pt is at baseline. Recommend DC home with no further therapy needs. Bed alarm left on per RN request due to hx of syncopal episodes and pt educated to call for help. Pt appropriate to ambulate with nursing and mobility specialist while hospitalized for maintenance of Ind with mobility; RN and pt verbalized understanding. Re-consult with any changes in status.        Recommendations for follow up therapy are one component of a multi-disciplinary discharge planning process, led by the attending physician.  Recommendations may be updated based on patient status, additional functional criteria and insurance authorization.  Follow Up Recommendations No PT follow up    Assistance Recommended at Discharge PRN  Functional Status Assessment Patient has not had a recent decline in their functional status  Equipment  Recommendations  None recommended by PT       Precautions / Restrictions Precautions Precautions: Fall Restrictions Weight Bearing Restrictions: No      Mobility  Bed Mobility Overal bed mobility: Independent                  Transfers Overall transfer level: Modified independent                 General transfer comment: STS from EOB with BUE for support    Ambulation/Gait Ambulation/Gait assistance: Supervision Gait Distance (Feet): 120 Feet Assistive device: None         General Gait Details: Supervision for safety; reciprocal gait with adequate heel strike, cadence, and toe off. No obvious balance/gait deficits noted.    Balance Overall balance assessment: Modified Independent                               Standardized Balance Assessment Standardized Balance Assessment :  (4-item DGI score of 10/12 indicates pt is at low risk of falls. Intermittent veering with head turns.)           Pertinent Vitals/Pain Pain Assessment: No/denies pain    Home Living Family/patient expects to be discharged to:: Assisted living                        Prior Function Prior Level of Function : Independent/Modified Independent             Mobility Comments: Ind with mobility without AD ADLs Comments: Ind with ADL's; facility assists with IADL's     Hand Dominance  Dominant Hand: Right    Extremity/Trunk Assessment   Upper Extremity Assessment Upper Extremity Assessment: Overall WFL for tasks assessed (Grossly 5/5; sensation intact)    Lower Extremity Assessment Lower Extremity Assessment: Overall WFL for tasks assessed (Grossly 5/5; sensation intact)    Cervical / Trunk Assessment Cervical / Trunk Assessment: Normal  Communication   Communication: No difficulties  Cognition Arousal/Alertness: Awake/alert Behavior During Therapy: WFL for tasks assessed/performed Overall Cognitive Status: History of cognitive  impairments - at baseline                                 General Comments: A&O x3; required cues for time. Unable to state situation, stating multiple times that he doesn't think he fell as he did not remember it. Able to follow 100% of simple 2-step commands. Required intermittent repetition for safety/cueing due to poor safety awareness from baseline dementia        General Comments General comments (skin integrity, edema, etc.): skin tear noted to R shin; orthostatics negative and pt asymptomatic during session. Able to don shoes EOB without LOB.    Exercises Other Exercises Other Exercises: Grossly Ind-Mod I for bed mobility, transfers, and gait. Low fall risk via 4-item DGI. Other Exercises: Pt educated re: PT role/POC, DC recommendations, safety with mobility, call for help.   Assessment/Plan    PT Assessment Patient does not need any further PT services         PT Goals (Current goals can be found in the Care Plan section)  Acute Rehab PT Goals PT Goal Formulation: All assessment and education complete, DC therapy     AM-PAC PT "6 Clicks" Mobility  Outcome Measure Help needed turning from your back to your side while in a flat bed without using bedrails?: None Help needed moving from lying on your back to sitting on the side of a flat bed without using bedrails?: None Help needed moving to and from a bed to a chair (including a wheelchair)?: None Help needed standing up from a chair using your arms (e.g., wheelchair or bedside chair)?: None Help needed to walk in hospital room?: A Little Help needed climbing 3-5 steps with a railing? : A Little 6 Click Score: 22    End of Session Equipment Utilized During Treatment: Gait belt Activity Tolerance: Patient tolerated treatment well Patient left: in bed;with call bell/phone within reach;with bed alarm set Nurse Communication: Mobility status      Time: 0881-1031 PT Time Calculation (min) (ACUTE ONLY): 23  min   Charges:   PT Evaluation $PT Eval Low Complexity: 1 Low           Vira Blanco, PT, DPT 9:31 AM,06/08/21

## 2021-06-08 NOTE — Plan of Care (Signed)

## 2021-06-08 NOTE — Progress Notes (Signed)
PROGRESS NOTE    Stanley Nielsen  ALP:[id number] DOB: 01-13-1941 DOA: 06/07/2021 PCP: Martie Round, NP   Brief Narrative:  Stanley Nielsen is a 80 y.o. male from [place] [facility] - with medical history significant for hypertension, insomnia, non-insulin-dependent diabetes mellitus, dementia, history of cerebral infarction, gout in remission, sleep apnea, CPAP dependent, depression, gout, hyperlipidemia, who presents emergency department for chief concerns of syncopal event.  Assessment & Plan:   Syncopal event-etiology work-up in progress, query TIA versus stroke -Neurology consulted given negative MRI, concern for seizure as source of syncope  -Patient appears to be on valproic acid/Depakote for unclear etiology, level at intake was low  - Echo completed, pending evaluation - PT, OT, SLP - Fall precaution ongoing - Ammonia, B12, troponin, magnesium within normal limits, Depakote level undetectable - Family indicates he is VERY noncompliant with home meds  History of hypertension - Resume home meds  Non-insulin-dependent diabetes mellitus - Insulin SSI with at bedtime coverage - Goal inpatient blood glucose level is 140-180   OSA-CPAP dependent - CPAP nightly ordered   History of dementia with behavioral disturbance -Continue home meds -including Seroquel, Depakote, galantamine, mirtazapine   DVT prophylaxis: Lovenox Code Status: Full Family Communication: Cousin - [name] Stutts updated over the phone  Status is: Inpatient  Dispo: The patient is from: Facility              Anticipated d/c is to: Same              Anticipated d/c date is: 48 to 72 hours               Patient currently not medically stable for discharge  Consultants:  Neurology  Procedures:  Pending  Antimicrobials:  None indicated  Subjective: No acute issues or events overnight resting comfortably in bed having just work with PT with no deficits or issues with ambulating, appears  to be independent.  Denies nausea vomiting diarrhea constipation headache fevers chills or chest pain.  Patient declines 'syncope/passing out' as he does not recall any issues or having any symptoms prior to the episodes as above.  Objective: Vitals:   06/08/21 0500 06/08/21 0530 06/08/21 0600 06/08/21 0630  BP: 133/61 113/61 130/67 (!) 162/80  Pulse: 66 66 66 72  Resp: 15 17 15 11   Temp:      TempSrc:      SpO2: 97% 94% 95% 95%  Weight:       No intake or output data in the 24 hours ending 06/08/21 0754 Filed Weights   06/07/21 1929  Weight: 88 kg    Examination:  General:  Pleasantly resting in bed, No acute distress.  Oriented to person only. HEENT:  Normocephalic atraumatic.  Sclerae nonicteric, noninjected.  Extraocular movements intact bilaterally. Neck:  Without mass or deformity.  Trachea is midline. Lungs:  Clear to auscultate bilaterally without rhonchi, wheeze, or rales. Heart:  Regular rate and rhythm.  Without murmurs, rubs, or gallops. Abdomen:  Soft, nontender, nondistended.  Without guarding or rebound. Extremities: Without cyanosis, clubbing, edema, or obvious deformity. Vascular:  Dorsalis pedis and posterior tibial pulses palpable bilaterally. Skin:  Warm and dry, no erythema, no ulcerations.  Data Reviewed: I have personally reviewed following labs and imaging studies  CBC: Recent Labs  Lab 06/07/21 1926  WBC 7.5  NEUTROABS 4.0  HGB 14.3  HCT 42.0  MCV 86.4  PLT 219   Basic Metabolic Panel: Recent Labs  Lab 06/07/21 1926 06/07/21 2227  NA 134*  --  K 3.2*  --   CL 104  --   CO2 23  --   GLUCOSE 199*  --   BUN 12  --   CREATININE 1.09  --   CALCIUM 9.2  --   MG  --  2.0   GFR: Estimated Creatinine Clearance: 59.3 mL/min (by C-G formula based on SCr of 1.09 mg/dL). Liver Function Tests: Recent Labs  Lab 06/07/21 1926  AST 23  ALT 10  ALKPHOS 84  BILITOT 0.7  PROT 8.3*  ALBUMIN 4.1   No results for input(s): LIPASE, AMYLASE in  the last 168 hours. Recent Labs  Lab 06/07/21 2227  AMMONIA 11   Coagulation Profile: No results for input(s): INR, PROTIME in the last 168 hours. Cardiac Enzymes: No results for input(s): CKTOTAL, CKMB, CKMBINDEX, TROPONINI in the last 168 hours. BNP (last 3 results) No results for input(s): PROBNP in the last 8760 hours. HbA1C: No results for input(s): HGBA1C in the last 72 hours. CBG: Recent Labs  Lab 06/07/21 2324 06/08/21 0750  GLUCAP 159* 111*   Lipid Profile: Recent Labs    06/08/21 0613  CHOL 169  HDL 34*  LDLCALC 113*  TRIG 112  CHOLHDL 5.0   Thyroid Function Tests: No results for input(s): TSH, T4TOTAL, FREET4, T3FREE, THYROIDAB in the last 72 hours. Anemia Panel: Recent Labs    06/07/21 2227  VITAMINB12 505   Sepsis Labs: No results for input(s): PROCALCITON, LATICACIDVEN in the last 168 hours.  Recent Results (from the past 240 hour(s))  Resp Panel by RT-PCR (Flu A&B, Covid) Nasopharyngeal Swab     Status: None   Collection Time: 06/07/21 10:27 PM   Specimen: Nasopharyngeal Swab; Nasopharyngeal(NP) swabs in vial transport medium  Result Value Ref Range Status   SARS Coronavirus 2 by RT PCR NEGATIVE NEGATIVE Final    Comment: (NOTE) SARS-CoV-2 target nucleic acids are NOT DETECTED.  The SARS-CoV-2 RNA is generally detectable in upper respiratory specimens during the acute phase of infection. The lowest concentration of SARS-CoV-2 viral copies this assay can detect is 138 copies/mL. A negative result does not preclude SARS-Cov-2 infection and should not be used as the sole basis for treatment or other patient management decisions. A negative result may occur with  improper specimen collection/handling, submission of specimen other than nasopharyngeal swab, presence of viral mutation(s) within the areas targeted by this assay, and inadequate number of viral copies(<138 copies/mL). A negative result must be combined with clinical observations,  patient history, and epidemiological information. The expected result is Negative.  Fact Sheet for Patients:  BloggerCourse.com  Fact Sheet for Healthcare Providers:  SeriousBroker.it  This test is no t yet approved or cleared by the [place] FDA and  has been authorized for detection and/or diagnosis of SARS-CoV-2 by FDA under an Emergency Use Authorization (EUA). This EUA will remain  in effect (meaning this test can be used) for the duration of the COVID-19 declaration under Section 564(b)(1) of the Act, 21 U.S.C.section 360bbb-3(b)(1), unless the authorization is terminated  or revoked sooner.       Influenza A by PCR NEGATIVE NEGATIVE Final   Influenza B by PCR NEGATIVE NEGATIVE Final    Comment: (NOTE) The Xpert Xpress SARS-CoV-2/FLU/RSV plus assay is intended as an aid in the diagnosis of influenza from Nasopharyngeal swab specimens and should not be used as a sole basis for treatment. Nasal washings and aspirates are unacceptable for Xpert Xpress SARS-CoV-2/FLU/RSV testing.  Fact Sheet for Patients: BloggerCourse.com  Fact Sheet  for Healthcare Providers: SeriousBroker.it  This test is not yet approved or cleared by the [place] and has been authorized for detection and/or diagnosis of SARS-CoV-2 by FDA under an Emergency Use Authorization (EUA). This EUA will remain in effect (meaning this test can be used) for the duration of the COVID-19 declaration under Section 564(b)(1) of the Act, 21 U.S.C. section 360bbb-3(b)(1), unless the authorization is terminated or revoked.  Performed at [facility], [address], [place], [place] [address]          Radiology Studies: DG Chest 2 View  Result Date: 06/07/2021 CLINICAL DATA:  Fall injury, syncopal episode. EXAM: CHEST - 2 VIEW COMPARISON:  Portable chest 03/05/2021 FINDINGS: The heart  size and mediastinal contours are within normal limits. Both lungs are clear. The visualized skeletal structures are unremarkable except for osteopenia and bridging enthesopathy of the thoracic spine. Mild chronic elevation right hemidiaphragm with anterior eventration. IMPRESSION: No active cardiopulmonary disease or interval changes. Electronically Signed   By: Almira Bar M.D.   On: 06/07/2021 20:05   CT Head Wo Contrast  Result Date: 06/07/2021 CLINICAL DATA:  Status post fall. EXAM: CT HEAD WITHOUT CONTRAST TECHNIQUE: Contiguous axial images were obtained from the base of the skull through the vertex without intravenous contrast. COMPARISON:  March 03, 2021 FINDINGS: Brain: There is moderate severity cerebral atrophy with widening of the extra-axial spaces and ventricular dilatation. There are areas of decreased attenuation within the white matter tracts of the supratentorial brain, consistent with microvascular disease changes. 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 Vascular: No hyperdense vessel or unexpected calcification. Skull: Normal. Negative for fracture or focal lesion. Sinuses/Orbits: No acute finding. Other: None. IMPRESSION: 1. Moderate severity cerebral atrophy. 2. Small, chronic cerebellar infarcts. 3. No acute intracranial abnormality. Electronically Signed   By: Aram Candela M.D.   On: 06/07/2021 20:19   CT Cervical Spine Wo Contrast  Result Date: 06/07/2021 CLINICAL DATA:  Status post fall. EXAM: CT CERVICAL SPINE WITHOUT CONTRAST TECHNIQUE: Multidetector CT imaging of the cervical spine was performed without intravenous contrast. Multiplanar CT image reconstructions were also generated. COMPARISON:  March 03, 2021 FINDINGS: Alignment: Normal. Skull base and vertebrae: No acute fracture. No primary bone lesion or focal pathologic process. Soft tissues and spinal canal: No prevertebral fluid or swelling. No visible canal hematoma. Disc levels: Moderate severity  endplate sclerosis is seen at the levels of C3-C4, C4-C5, C5-C6 and C6-C7. Moderate severity multilevel intervertebral disc space narrowing is seen throughout all levels of the cervical spine. Bilateral moderate severity multilevel facet joint hypertrophy is noted. Upper chest: Negative. Other: None. IMPRESSION: 1. Moderate severity multilevel degenerative changes, as described above. 2. No evidence of an acute fracture or subluxation. Electronically Signed   By: Aram Candela M.D.   On: 06/07/2021 20:24   MR BRAIN WO CONTRAST  Result Date: 06/07/2021 CLINICAL DATA:  Syncope, presyncope EXAM: MRI HEAD WITHOUT CONTRAST TECHNIQUE: Multiplanar, multiecho pulse sequences of the brain and surrounding structures were obtained without intravenous contrast. COMPARISON:  MRI 07/30/2019, correlation is also made with CT 06/07/2021 FINDINGS: Brain: No restricted diffusion to suggest acute or subacute infarct. No acute hemorrhage, mass, mass effect, or midline shift. No hydrocephalus or extra-axial collection. Cerebral atrophy, somewhat advanced for age, which appears most prominent in the bilateral frontal and temporal lobes. Sequela of prior cerebellar infarcts and right caudate head infarct; left cerebellar infarcts are new compared to 2021 but are not acute and were present on the 03/03/2021 CT. Confluent  T2 hyperintense signal in the periventricular white matter, likely the sequela of moderate to severe chronic small vessel ischemic disease. No foci of hemosiderin deposition to suggest remote hemorrhage or superficial siderosis. Vascular: Normal flow voids. Skull and upper cervical spine: Normal marrow signal. Sinuses/Orbits: Mucosal thickening in the right frontal sinus and ethmoid air cells, with a mucous retention cyst in the left maxillary sinus. Status post bilateral lens replacements. Other: Fluid in the left mastoid tip. IMPRESSION: 1.  No acute intracranial process. 2. Global cerebral atrophy, which  appears most prominent in the bilateral frontal and temporal lobes. Electronically Signed   By: Wiliam Ke M.D.   On: 06/07/2021 22:56    Scheduled Meds:   stroke: mapping our early stages of recovery book   Does not apply Once   aspirin  300 mg Rectal Daily   Or   aspirin  325 mg Oral Daily   enoxaparin (LOVENOX) injection  40 mg Subcutaneous Q24H   insulin aspart  0-15 Units Subcutaneous TID WC   insulin aspart  0-5 Units Subcutaneous QHS   Continuous Infusions:  promethazine (PHENERGAN) injection (IM or IVPB)      LOS: 0 days   Time spent:  Azucena Fallen, DO [facility]  If 7PM-7AM, please contact night-coverage [email or web address]  06/08/2021, 7:54 AM

## 2021-06-08 NOTE — Progress Notes (Signed)
OT Cancellation Note  Patient Details Name: Sylvanus Telford MRN: [id number] DOB: [birth year]   Cancelled Treatment:    Reason Eval/Treat Not Completed: OT screened, no needs identified, will sign off. Per chart review and discussion with PT, pt at baseline level of fxl mobility; pt to return to home at ALF with no rehab services required at this time.  Latina Craver, PhD, MS, OTR/L 06/08/21, 10:34 AM

## 2021-06-08 NOTE — Consult Note (Addendum)
NEURO HOSPITALIST CONSULT NOTE   Requestig physician: Dr. Avon Gully  Reason for Consult: Syncope versus seizure  History obtained from:  Hospitalist and Chart     HPI:                                                                                                                                          Stanley Nielsen is an 80 y.o. male with a history of dementia, CKD3, depression, gout, HOH, HTN, sleep apnea, vertigo and cataract extraction, who presented to the ED yesterday evening from [facility] after a witnessed fall while he was at the nurses station. The patient endorsed to Triage RN that he had hit his head, but also stated that he did not remember any falling and also was denying pain.   Given some inconsistencies to his story, RN called his facility and found out the following: "This RN called facility and spoke to [place], [place]. She states she witnessed the fall. Pt was standing, talking on phone. She states he passed out, landing onto crown of head. She states he came to his normal self within 30 seconds. No thinners or other injuries reported. GCS 15"  When interviewed by EDP, the patient denied any headache, neck pain or CP. He did endorse nausea near to or during the time of the fall.   In the emergency department, while in the waiting room, patient had 1 episode of emesis.  Zofran 4 mg p.o. was ordered for patient.  The patient is on valproic acid 125 mg BID. This is a low dose which would more typically be used for a psychiatric indication such as depression or for mood stabilization. The dose is not typical for seizure prophylaxis. The patient, in the context of his dementia, is unable to recall being on this medication. He is also unable to name any of his other medications.   Past Medical History:  Diagnosis Date   CKD (chronic kidney disease), stage III ([place])    Dementia ([place])    Depression    Gout    HOH (hard of hearing)    Hypertension     Sleep apnea    Vertigo     Past Surgical History:  Procedure Laterality Date   CATARACT EXTRACTION W/PHACO Left 04/30/2015   Procedure: CATARACT EXTRACTION PHACO AND INTRAOCULAR LENS PLACEMENT ([place]);  Surgeon: Birder Robson, MD;  Location: [facility] ORS;  Service: Ophthalmology;  Laterality: Left;  [place] 00:47 AP% 23.5 CDE 11.16 fluid pack lot # [id number]   COLONOSCOPY      Family History  Problem Relation Age of Onset   Dementia Mother    Alcoholism Father               Social History:  reports that he has never  smoked. He has never used smokeless tobacco. He reports current alcohol use. He reports that he does not use drugs.  Allergies  Allergen Reactions   Penicillins Hives    Has patient had a PCN reaction causing immediate rash, facial/tongue/throat swelling, SOB or lightheadedness with hypotension: No Has patient had a PCN reaction causing severe rash involving mucus membranes or skin necrosis: No Has patient had a PCN reaction that required hospitalization: No Has patient had a PCN reaction occurring within the last 10 years: No If all of the above answers are "NO", then may proceed with Cephalosporin use.   Sulfa Antibiotics Nausea And Vomiting    HOME MEDICATIONS:                                                                                                                     Prior to Admission medications   Medication Sig Start Date End Date Taking? Authorizing Provider  amLODipine (NORVASC) 10 MG tablet Take 10 mg by mouth daily.        [provider]  ascorbic acid (VITAMIN C) 500 MG tablet Take 500 mg by mouth daily.       [provider]  aspirin EC 81 MG tablet Take 81 mg by mouth daily.        [provider]  carboxymethylcellul-glycerin (REFRESH OPTIVE) 0.5-0.9 % ophthalmic solution Place 1 drop into both eyes every 6 (six) hours as needed for dry eyes. Every 6 hrs        [provider]  cyanocobalamin 1000 MCG tablet Take  1 tablet by mouth daily. 11/25/20     [provider]  divalproex (DEPAKOTE SPRINKLE) 125 MG capsule Take 125 mg by mouth 2 (two) times daily. 01/13/21     [provider]  docusate sodium (COLACE) 100 MG capsule Take 100 mg by mouth 2 (two) times daily.       [provider]  fluocinonide-emollient (LIDEX-E) 0.05 % cream Use as directed Patient not taking: Reported on 03/03/2021 12/18/19     Gardiner Barefoot, DPM  galantamine (RAZADYNE ER) 16 MG 24 hr capsule Take 16 mg by mouth daily with breakfast.        [provider]  metFORMIN (GLUCOPHAGE) 500 MG tablet Take 500 mg by mouth 2 (two) times daily with a meal.       [provider]  mineral oil enema Place 1 enema rectally as needed for mild constipation.        [provider]  MIRALAX 17 GM/SCOOP powder Take 17 g by mouth daily as needed for mild constipation.  10/24/19     [provider]  mirtazapine (REMERON) 7.5 MG tablet Take 1 tablet by mouth at bedtime. 12/17/20     [provider]  Multiple Vitamins-Minerals (MULTIVITAMIN WITH MINERALS) tablet Take 1 tablet by mouth daily.       [provider]  omeprazole (PRILOSEC) 20 MG capsule Take 20 mg by mouth daily.  [provider]  QUEtiapine (SEROQUEL) 25 MG tablet Take 1 tablet by mouth at bedtime. 02/26/21     [provider]  valsartan (DIOVAN) 160 MG tablet Take 160 mg by mouth daily.         ROS:                                                                                                                                       Does not endorse any current symptoms. States that he feels fine. Unable to obtain a reliable ROS due to his dementia.    Blood pressure (!) 162/80, pulse 73, temperature 98.4 F (36.9 C), temperature source Oral, resp. rate 11, weight 88 kg, SpO2 96 %.   General Examination:                                                                                                        Physical Exam  HEENT-  Normocephalic    Lungs- Respirations unlabored Extremities- Warm and well perfused.    Neurological Examination Mental Status: Awake and alert. Speech is fluent with intact naming for common objects. Comprehension for all basic questions is intact, but had some difficulty with complex motor commands such as when testing finger-to-nose, requiring repeated clarification. Oriented to location being a hospital, but unable to name it specifically. When told he was at [facility], he recalled that he was in [place] with some difficulty. Recalled that the state is [place] with some difficulty. Could not recall the month or the day of the week, but did correctly remember the year. Pleasant and cooperative.  Cranial Nerves: II: Temporal visual fields intact without extinction to DSS. PERRL.   III,IV, VI: No ptosis. EOMI with saccadic pursuits noted. No nystagmus.  V: Temp sensation equal bilaterally VII: Smile symmetric VIII: Hearing intact to voice.  IX,X: No hypophonia or hoarseness XI: Symmetric XII: Midline tongue extension Motor: Right : Upper extremity   5/5    Left:     Upper extremity   5/5  Lower extremity   5/5     Lower extremity   5/5 Sensory: Temp and light touch intact throughout, bilaterally.  Deep Tendon Reflexes: 2+ and symmetric throughout Cerebellar: No ataxia with FNF bilaterally. No resting or action tremor.  Gait: Normal gait and station. Good turns.    Lab Results: Basic Metabolic Panel: Recent Labs  Lab 06/07/21 1926 06/07/21 2227  NA 134*  --   K 3.2*  --  CL 104  --   CO2 23  --   GLUCOSE 199*  --   BUN 12  --   CREATININE 1.09  --   CALCIUM 9.2  --   MG  --  2.0    CBC: Recent Labs  Lab 06/07/21 1926  WBC 7.5  NEUTROABS 4.0  HGB 14.3  HCT 42.0  MCV 86.4  PLT 219    Cardiac Enzymes: No results for input(s): CKTOTAL, CKMB, CKMBINDEX, TROPONINI in the last 168 hours.  Lipid Panel: Recent Labs  Lab 06/08/21 0613   CHOL 169  TRIG 112  HDL 34*  CHOLHDL 5.0  VLDL 22  LDLCALC 113*    Imaging: DG Chest 2 View  Result Date: 06/07/2021 CLINICAL DATA:  Fall injury, syncopal episode. EXAM: CHEST - 2 VIEW COMPARISON:  Portable chest 03/05/2021 FINDINGS: The heart size and mediastinal contours are within normal limits. Both lungs are clear. The visualized skeletal structures are unremarkable except for osteopenia and bridging enthesopathy of the thoracic spine. Mild chronic elevation right hemidiaphragm with anterior eventration. IMPRESSION: No active cardiopulmonary disease or interval changes. Electronically Signed   By: Telford Nab M.D.   On: 06/07/2021 20:05   CT Head Wo Contrast  Result Date: 06/07/2021 CLINICAL DATA:  Status post fall. EXAM: CT HEAD WITHOUT CONTRAST TECHNIQUE: Contiguous axial images were obtained from the base of the skull through the vertex without intravenous contrast. COMPARISON:  March 03, 2021 FINDINGS: Brain: There is moderate severity cerebral atrophy with widening of the extra-axial spaces and ventricular dilatation. There are areas of decreased attenuation within the white matter tracts of the supratentorial brain, consistent with microvascular disease changes. 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 Vascular: No hyperdense vessel or unexpected calcification. Skull: Normal. Negative for fracture or focal lesion. Sinuses/Orbits: No acute finding. Other: None. IMPRESSION: 1. Moderate severity cerebral atrophy. 2. Small, chronic cerebellar infarcts. 3. No acute intracranial abnormality. Electronically Signed   By: Virgina Norfolk M.D.   On: 06/07/2021 20:19   CT Cervical Spine Wo Contrast  Result Date: 06/07/2021 CLINICAL DATA:  Status post fall. EXAM: CT CERVICAL SPINE WITHOUT CONTRAST TECHNIQUE: Multidetector CT imaging of the cervical spine was performed without intravenous contrast. Multiplanar CT image reconstructions were also generated. COMPARISON:  March 03, 2021 FINDINGS: Alignment: Normal. Skull base and vertebrae: No acute fracture. No primary bone lesion or focal pathologic process. Soft tissues and spinal canal: No prevertebral fluid or swelling. No visible canal hematoma. Disc levels: Moderate severity endplate sclerosis is seen at the levels of C3-C4, C4-C5, C5-C6 and C6-C7. Moderate severity multilevel intervertebral disc space narrowing is seen throughout all levels of the cervical spine. Bilateral moderate severity multilevel facet joint hypertrophy is noted. Upper chest: Negative. Other: None. IMPRESSION: 1. Moderate severity multilevel degenerative changes, as described above. 2. No evidence of an acute fracture or subluxation. Electronically Signed   By: Virgina Norfolk M.D.   On: 06/07/2021 20:24   MR BRAIN WO CONTRAST  Result Date: 06/07/2021 CLINICAL DATA:  Syncope, presyncope EXAM: MRI HEAD WITHOUT CONTRAST TECHNIQUE: Multiplanar, multiecho pulse sequences of the brain and surrounding structures were obtained without intravenous contrast. COMPARISON:  MRI 07/30/2019, correlation is also made with CT 06/07/2021 FINDINGS: Brain: No restricted diffusion to suggest acute or subacute infarct. No acute hemorrhage, mass, mass effect, or midline shift. No hydrocephalus or extra-axial collection. Cerebral atrophy, somewhat advanced for age, which appears most prominent in the bilateral frontal and temporal lobes. Sequela of prior cerebellar infarcts and right caudate  head infarct; left cerebellar infarcts are new compared to 2021 but are not acute and were present on the 03/03/2021 CT. Confluent T2 hyperintense signal in the periventricular white matter, likely the sequela of moderate to severe chronic small vessel ischemic disease. No foci of hemosiderin deposition to suggest remote hemorrhage or superficial siderosis. Vascular: Normal flow voids. Skull and upper cervical spine: Normal marrow signal. Sinuses/Orbits: Mucosal thickening in the right  frontal sinus and ethmoid air cells, with a mucous retention cyst in the left maxillary sinus. Status post bilateral lens replacements. Other: Fluid in the left mastoid tip. IMPRESSION: 1.  No acute intracranial process. 2. Global cerebral atrophy, which appears most prominent in the bilateral frontal and temporal lobes. Electronically Signed   By: Merilyn Baba M.D.   On: 06/07/2021 22:56     Assessment: 80 year old male with a history of dementia, CKD3, depression, gout, HOH, HTN, sleep apnea, vertigo and cataract extraction, who presented to the ED yesterday evening from [facility] after a witnessed fall while he was at the nurses station. The patient was witnessed to have struck his head. He regained consciousness after about 30 seconds. No seizure-like activity documented in the chart.    1. The patient is on valproic acid 125 mg BID. This is a low dose which would more typically be used for a psychiatric indication such as depression or for mood stabilization. The dose is not typical for seizure prophylaxis. The patient, in the context of his dementia, is unable to recall being on this medication. He is also unable to name any of his other medications. Valproic acid level is < 10, but for a psychiatric condition it does not need to be in the therapeutic range for anticonvulsant-activity.  2. Exam is consistent with his diagnosis of dementia. No focal motor or sensory deficit noted.  3. MRI brain: No acute intracranial process. Global cerebral atrophy, which appears most prominent in the bilateral frontal and temporal lobes. 4. Most likely etiology is syncope given no description of seizure like activity, patient awakening within 30 seconds of the initial loss of consciousness and no description of postictal confusion relative to his baseline level of cognition.    Recommendations: 1. EEG tomorrow (ordered) 2. Continue valproic acid. Call facility to determine the indication for his valproic  acid.  3. Syncope work up.  4. Falls precautions   Electronically signed: Dr. Kerney Elbe 06/08/2021, 2:35 PM

## 2021-06-09 DIAGNOSIS — G301 Alzheimer's disease with late onset: Secondary | ICD-10-CM | POA: Diagnosis not present

## 2021-06-09 DIAGNOSIS — E119 Type 2 diabetes mellitus without complications: Secondary | ICD-10-CM | POA: Diagnosis not present

## 2021-06-09 DIAGNOSIS — R55 Syncope and collapse: Secondary | ICD-10-CM | POA: Diagnosis not present

## 2021-06-09 DIAGNOSIS — K219 Gastro-esophageal reflux disease without esophagitis: Secondary | ICD-10-CM | POA: Diagnosis not present

## 2021-06-09 LAB — BASIC METABOLIC PANEL
Anion gap: 7 (ref 5–15)
BUN: 18 mg/dL (ref 8–23)
CO2: 25 mmol/L (ref 22–32)
Calcium: 8.6 mg/dL — ABNORMAL LOW (ref 8.9–10.3)
Chloride: 107 mmol/L (ref 98–111)
Creatinine, Ser: 1.18 mg/dL (ref 0.61–1.24)
GFR, Estimated: >60 mL/min (ref >60)
Glucose, Bld: 119 mg/dL — ABNORMAL HIGH (ref 70–99)
Potassium: 3.2 mmol/L — ABNORMAL LOW (ref 3.5–5.1)
Sodium: 139 mmol/L (ref 135–145)

## 2021-06-09 LAB — CBC
HCT: 37.9 % — ABNORMAL LOW (ref 39.0–52.0)
Hemoglobin: 12.9 g/dL — ABNORMAL LOW (ref 13.0–17.0)
MCH: 29.4 pg (ref 26.0–34.0)
MCHC: 34 g/dL (ref 30.0–36.0)
MCV: 86.3 fL (ref 80.0–100.0)
Platelets: 180 10*3/uL (ref 150–400)
RBC: 4.39 MIL/uL (ref 4.22–5.81)
RDW: 13.8 % (ref 11.5–15.5)
WBC: 5.8 10*3/uL (ref 4.0–10.5)
nRBC: 0 % (ref 0.0–0.2)

## 2021-06-09 LAB — GLUCOSE, CAPILLARY
Glucose-Capillary: 114 mg/dL — ABNORMAL HIGH (ref 70–99)
Glucose-Capillary: 208 mg/dL — ABNORMAL HIGH (ref 70–99)
Glucose-Capillary: 101 mg/dL — ABNORMAL HIGH (ref 70–99)

## 2021-06-09 LAB — HEMOGLOBIN A1C
Hgb A1c MFr Bld: 5.7 % — ABNORMAL HIGH (ref 4.8–5.6)
Mean Plasma Glucose: 117 mg/dL

## 2021-06-09 NOTE — TOC Initial Note (Signed)
Transition of Care [facility]) - Initial/Assessment Note    Patient Details  Name: Stanley Nielsen MRN: [id number] Date of Birth: 1940-08-06  [facility]) CM/SW Contact:    Pete Pelt, RN Phone Number: 06/09/2021, 2:32 PM  Clinical Narrative:       As per family and [facility], patient can discharge back to [facility] today with family transporting.              Expected Discharge Plan: Assisted Living [facility]) Barriers to Discharge: Barriers Resolved   Patient Goals and CMS Choice     Choice offered to / list presented to : NA  Expected Discharge Plan and Services Expected Discharge Plan: Assisted Living ([place])   Discharge Planning Services: CM Consult Post Acute Care Choice:  ([place]) Living arrangements for the past 2 months: [facility]) Expected Discharge Date: 06/09/21                                    Prior Living Arrangements/Services Living arrangements for the past 2 months: [facility]) Lives with:: Facility Resident Patient language and need for interpreter reviewed:: Yes (No interpreter required) Do you feel safe going back to the place where you live?: Yes      Need for Family Participation in Patient Care: Yes (Comment) Care giver support system in place?: Yes (comment)   Criminal Activity/Legal Involvement Pertinent to Current Situation/Hospitalization: No - Comment as needed  Activities of Daily Living Home Assistive Devices/Equipment: None ADL Screening (condition at time of admission) Patient's cognitive ability adequate to safely complete daily activities?: Yes Is the patient deaf or have difficulty hearing?: No Does the patient have difficulty seeing, even when wearing glasses/contacts?: No Does the patient have difficulty concentrating, remembering, or making decisions?: Yes Patient able to express need for assistance with  ADLs?: Yes Does the patient have difficulty dressing or bathing?: No Independently performs ADLs?: Yes (appropriate for developmental age) Does the patient have difficulty walking or climbing stairs?: No Weakness of Legs: None Weakness of Arms/Hands: None  Permission Sought/Granted Permission sought to share information with : Case Manager, Chartered certified accountant granted to share information with : Yes, Verbal Permission Granted     Permission granted to share info w AGENCY: [facility]        Emotional Assessment         Alcohol / Substance Use: Not Applicable Psych Involvement: No (comment)  Admission diagnosis:  Syncope and collapse [R55] Syncope [R55] Fall, initial encounter [W19.XXXA] Patient Active Problem List   Diagnosis Date Noted   Syncope 06/07/2021   Aggressive behavior 05/05/2020   Pain due to onychomycosis of toenails of both feet 12/18/2019   Dermatitis 12/18/2019   Diabetes mellitus type 2 in nonobese [facility])    Stage 3 chronic kidney disease (HCC)    OSA (obstructive sleep apnea)    Diastolic dysfunction    Hypokalemia    GERD (gastroesophageal reflux disease) 03/26/2017   Acute renal failure superimposed on stage 3 chronic kidney disease ([place]) 03/26/2017   Ischemic stroke ([place]) [id number]   Acute metabolic encephalopathy [id number]   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

## 2021-06-09 NOTE — Plan of Care (Signed)
Neurology plan of care  Per Dr. Natale Milch, patient's mental status much improved today. Semiology of event c/w syncope, similar to prior presentation for same in Aug. TTE no sig abnl, no AS. I do not feel he needs an EEG at this time. Continue VPA home dose for behavioral disturbance in the setting of dementia. No further inpatient neurologic workup indicated at this time. Neurology to sign off, but please re-engage if additional questions arise.  Stanley Neighbors, MD [facility] [phone]  If 7pm- 7am, please page neurology on call as listed in AMION.

## 2021-06-09 NOTE — Discharge Summary (Signed)
Physician Discharge Summary  Stanley Nielsen Latino AOZ:[id number] DOB: 1941/05/19 DOA: 06/07/2021  PCP: Martie Round, NP  Admit date: 06/07/2021 Discharge date: 06/09/2021  Admitted From: [facility] Disposition: Same  Recommendations for Outpatient Follow-up:  Follow up with PCP in 1-2 weeks  Discharge Condition: Stable CODE STATUS: Full Diet recommendation: As tolerated  Brief/Interim Summary: Stanley Nielsen is a 80 y.o. male from [facility] - with medical history significant for hypertension, insomnia, non-insulin-dependent diabetes mellitus, dementia, history of cerebral infarction, gout in remission, sleep apnea, CPAP dependent, depression, gout, hyperlipidemia, who presents emergency department for chief concerns of syncopal event.   Assessment & Plan:   Questionably syncopal event versus fall  -Ruled out stroke on imaging, neurology consulted no need for EEG given unlikely seizure presentation -Unclear if this was orthostatic or mechanical given patient's poor mental status and limited information from previous facility -Otherwise stable for discharge back to facility given negative imaging negative work-up echo without overt dysfunction PT clearing patient for safe ambulation, labs unremarkable for any abnormalities consistent with cause of syncope.  History of hypertension -Continue home meds   Non-insulin-dependent diabetes mellitus -Continue home medications no changes   OSA-CPAP dependent - CPAP nightly ordered   History of dementia with behavioral disturbance -Continue home meds -including Seroquel, Depakote, galantamine, mirtazapine -Family indicates poor compliance, likely the cause of patient's ongoing behavioral disturbances  Discharge Instructions   Allergies as of 06/09/2021       Reactions   Penicillins Hives   Has patient had a PCN reaction causing immediate rash, facial/tongue/throat swelling, SOB or lightheadedness with  hypotension: No Has patient had a PCN reaction causing severe rash involving mucus membranes or skin necrosis: No Has patient had a PCN reaction that required hospitalization: No Has patient had a PCN reaction occurring within the last 10 years: No If all of the above answers are "NO", then may proceed with Cephalosporin use.   Sulfa Antibiotics Nausea And Vomiting        Medication List     TAKE these medications    aspirin EC 81 MG tablet Take 81 mg by mouth daily.   clotrimazole 1 % cream Commonly known as: LOTRIMIN Apply 1 application topically 2 (two) times daily.   cyanocobalamin 1000 MCG tablet Take 1 tablet by mouth daily.   divalproex 250 MG DR tablet Commonly known as: DEPAKOTE Take 250 mg by mouth daily.   galantamine 16 MG 24 hr capsule Commonly known as: RAZADYNE ER Take 16 mg by mouth daily with breakfast.   LORazepam 0.5 MG tablet Commonly known as: ATIVAN Take 0.5 mg by mouth every 8 (eight) hours as needed for anxiety.   melatonin 3 MG Tabs tablet Take 3 mg by mouth at bedtime.   metFORMIN 500 MG tablet Commonly known as: GLUCOPHAGE Take 500 mg by mouth 2 (two) times daily with a meal.   MiraLax 17 GM/SCOOP powder Generic drug: polyethylene glycol powder Take 17 g by mouth daily as needed for mild constipation.   mirtazapine 7.5 MG tablet Commonly known as: REMERON Take 15 mg by mouth at bedtime.   omeprazole 20 MG capsule Commonly known as: PRILOSEC Take 20 mg by mouth daily.   QUEtiapine 25 MG tablet Commonly known as: SEROQUEL Take 2 tablets by mouth at bedtime.   sennosides-docusate sodium 8.6-50 MG tablet Commonly known as: SENOKOT-S Take 2 tablets by mouth daily.   tamsulosin 0.4 MG Caps capsule Commonly known as: FLOMAX Take 0.4 mg by mouth daily.  Allergies  Allergen Reactions   Penicillins Hives    Has patient had a PCN reaction causing immediate rash, facial/tongue/throat swelling, SOB or lightheadedness with  hypotension: No Has patient had a PCN reaction causing severe rash involving mucus membranes or skin necrosis: No Has patient had a PCN reaction that required hospitalization: No Has patient had a PCN reaction occurring within the last 10 years: No If all of the above answers are "NO", then may proceed with Cephalosporin use.   Sulfa Antibiotics Nausea And Vomiting    Consultations: Neurology  Procedures/Studies: DG Chest 2 View  Result Date: 06/07/2021 CLINICAL DATA:  Fall injury, syncopal episode. EXAM: CHEST - 2 VIEW COMPARISON:  Portable chest 03/05/2021 FINDINGS: The heart size and mediastinal contours are within normal limits. Both lungs are clear. The visualized skeletal structures are unremarkable except for osteopenia and bridging enthesopathy of the thoracic spine. Mild chronic elevation right hemidiaphragm with anterior eventration. IMPRESSION: No active cardiopulmonary disease or interval changes. Electronically Signed   By: Almira Bar M.D.   On: 06/07/2021 20:05   CT Head Wo Contrast  Result Date: 06/07/2021 CLINICAL DATA:  Status post fall. EXAM: CT HEAD WITHOUT CONTRAST TECHNIQUE: Contiguous axial images were obtained from the base of the skull through the vertex without intravenous contrast. COMPARISON:  March 03, 2021 FINDINGS: Brain: There is moderate severity cerebral atrophy with widening of the extra-axial spaces and ventricular dilatation. There are areas of decreased attenuation within the white matter tracts of the supratentorial brain, consistent with microvascular disease changes. 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 Vascular: No hyperdense vessel or unexpected calcification. Skull: Normal. Negative for fracture or focal lesion. Sinuses/Orbits: No acute finding. Other: None. IMPRESSION: 1. Moderate severity cerebral atrophy. 2. Small, chronic cerebellar infarcts. 3. No acute intracranial abnormality. Electronically Signed   By: Aram Candela M.D.   On:  06/07/2021 20:19   CT Cervical Spine Wo Contrast  Result Date: 06/07/2021 CLINICAL DATA:  Status post fall. EXAM: CT CERVICAL SPINE WITHOUT CONTRAST TECHNIQUE: Multidetector CT imaging of the cervical spine was performed without intravenous contrast. Multiplanar CT image reconstructions were also generated. COMPARISON:  March 03, 2021 FINDINGS: Alignment: Normal. Skull base and vertebrae: No acute fracture. No primary bone lesion or focal pathologic process. Soft tissues and spinal canal: No prevertebral fluid or swelling. No visible canal hematoma. Disc levels: Moderate severity endplate sclerosis is seen at the levels of C3-C4, C4-C5, C5-C6 and C6-C7. Moderate severity multilevel intervertebral disc space narrowing is seen throughout all levels of the cervical spine. Bilateral moderate severity multilevel facet joint hypertrophy is noted. Upper chest: Negative. Other: None. IMPRESSION: 1. Moderate severity multilevel degenerative changes, as described above. 2. No evidence of an acute fracture or subluxation. Electronically Signed   By: Aram Candela M.D.   On: 06/07/2021 20:24   MR BRAIN WO CONTRAST  Result Date: 06/07/2021 CLINICAL DATA:  Syncope, presyncope EXAM: MRI HEAD WITHOUT CONTRAST TECHNIQUE: Multiplanar, multiecho pulse sequences of the brain and surrounding structures were obtained without intravenous contrast. COMPARISON:  MRI 07/30/2019, correlation is also made with CT 06/07/2021 FINDINGS: Brain: No restricted diffusion to suggest acute or subacute infarct. No acute hemorrhage, mass, mass effect, or midline shift. No hydrocephalus or extra-axial collection. Cerebral atrophy, somewhat advanced for age, which appears most prominent in the bilateral frontal and temporal lobes. Sequela of prior cerebellar infarcts and right caudate head infarct; left cerebellar infarcts are new compared to 2021 but are not acute and were present on the 03/03/2021 CT. Confluent T2  hyperintense signal in  the periventricular white matter, likely the sequela of moderate to severe chronic small vessel ischemic disease. No foci of hemosiderin deposition to suggest remote hemorrhage or superficial siderosis. Vascular: Normal flow voids. Skull and upper cervical spine: Normal marrow signal. Sinuses/Orbits: Mucosal thickening in the right frontal sinus and ethmoid air cells, with a mucous retention cyst in the left maxillary sinus. Status post bilateral lens replacements. Other: Fluid in the left mastoid tip. IMPRESSION: 1.  No acute intracranial process. 2. Global cerebral atrophy, which appears most prominent in the bilateral frontal and temporal lobes. Electronically Signed   By: Wiliam Ke M.D.   On: 06/07/2021 22:56   ECHOCARDIOGRAM COMPLETE  Result Date: 06/08/2021    ECHOCARDIOGRAM REPORT   Patient Name:   Vonte EDISON Gathers Date of Exam: 06/08/2021 Medical Rec #:  [id number]             Height:       69.0 in Accession #:    [id number]            Weight:       194.0 lb Date of Birth:  1940-07-13            BSA:          2.039 m Patient Age:    80 years              BP:           162/80 mmHg Patient Gender: M                     HR:           68 bpm. Exam Location:  [facility] Procedure: 2D Echo Indications:     Syncope R55, TIA G45.9  History:         Patient has prior history of Echocardiogram examinations, most                  recent 03/27/2017.  Sonographer:     Overton Mam RDCS Referring Phys:  [id number] AMY N COX Diagnosing Phys: Arnoldo Hooker MD IMPRESSIONS  1. Left ventricular ejection fraction, by estimation, is 60 to 65%. The left ventricle has normal function. The left ventricle has no regional wall motion abnormalities. Left ventricular diastolic parameters were normal.  2. Right ventricular systolic function is normal. The right ventricular size is normal.  3. The mitral valve is normal in structure. Mild mitral valve regurgitation.  4. The aortic valve is normal in structure. Aortic valve  regurgitation is mild. FINDINGS  Left Ventricle: Left ventricular ejection fraction, by estimation, is 60 to 65%. The left ventricle has normal function. The left ventricle has no regional wall motion abnormalities. The left ventricular internal cavity size was normal in size. There is  no left ventricular hypertrophy. Left ventricular diastolic parameters were normal. Right Ventricle: The right ventricular size is normal. No increase in right ventricular wall thickness. Right ventricular systolic function is normal. Left Atrium: Left atrial size was normal in size. Right Atrium: Right atrial size was normal in size. Pericardium: There is no evidence of pericardial effusion. Mitral Valve: The mitral valve is normal in structure. Mild mitral valve regurgitation. Tricuspid Valve: The tricuspid valve is normal in structure. Tricuspid valve regurgitation is mild. Aortic Valve: The aortic valve is normal in structure. Aortic valve regurgitation is mild. Aortic regurgitation PHT measures 683 msec. Aortic valve peak gradient measures 9.9 mmHg. Pulmonic Valve: The pulmonic valve was normal in  structure. Pulmonic valve regurgitation is not visualized. Aorta: The aortic root and ascending aorta are structurally normal, with no evidence of dilitation. IAS/Shunts: No atrial level shunt detected by color flow Doppler.  LEFT VENTRICLE PLAX 2D LVIDd:         4.50 cm   Diastology LVIDs:         3.10 cm   LV e' medial:    4.24 cm/s LV PW:         1.10 cm   LV E/e' medial:  16.0 LV IVS:        1.10 cm   LV e' lateral:   6.42 cm/s LVOT diam:     2.10 cm   LV E/e' lateral: 10.5 LV SV:         73 LV SV Index:   36 LVOT Area:     3.46 cm  RIGHT VENTRICLE RV Basal diam:  2.60 cm RV S prime:     13.10 cm/s TAPSE (M-mode): 1.8 cm LEFT ATRIUM             Index        RIGHT ATRIUM          Index LA diam:        4.10 cm 2.01 cm/m   RA Area:     9.90 cm LA Vol (A2C):   31.5 ml 15.45 ml/m  RA Volume:   18.90 ml 9.27 ml/m LA Vol (A4C):    31.9 ml 15.64 ml/m LA Biplane Vol: 33.7 ml 16.52 ml/m  AORTIC VALVE AV Area (Vmax): 1.90 cm AV Vmax:        157.50 cm/s AV Peak Grad:   9.9 mmHg LVOT Vmax:      86.20 cm/s LVOT Vmean:     56.900 cm/s LVOT VTI:       0.212 m AI PHT:         683 msec  AORTA Ao Root diam: 3.50 cm Ao Asc diam:  3.60 cm MITRAL VALVE               TRICUSPID VALVE MV Area (PHT): 2.48 cm    TV Peak grad:   24.6 mmHg MV Decel Time: 306 msec    TV Vmax:        2.48 m/s MV E velocity: 67.70 cm/s MV A velocity: 94.70 cm/s  SHUNTS MV E/A ratio:  0.71        Systemic VTI:  0.21 m                            Systemic Diam: 2.10 cm Arnoldo Hooker MD Electronically signed by Arnoldo Hooker MD Signature Date/Time: 06/08/2021/3:24:37 PM    Final      Subjective: No acute issues or events overnight denies nausea vomiting diarrhea constipation headache fevers chills or chest pain   Discharge Exam: Vitals:   06/08/21 2057 06/09/21 0656  BP: (!) 172/78 (!) 169/74  Pulse: 65 80  Resp: 18 18  Temp: (!) 97.5 F (36.4 C)   SpO2: 99% 97%   Vitals:   06/08/21 1836 06/08/21 1936 06/08/21 2057 06/09/21 0656  BP: (!) 152/75 (!) 157/95 (!) 172/78 (!) 169/74  Pulse: 80 83 65 80  Resp: 16 16 18 18   Temp: 98.2 F (36.8 C) 97.7 F (36.5 C) (!) 97.5 F (36.4 C)   TempSrc: Oral Oral    SpO2: 98% 100% 99% 97%  Weight:  General: Pt is alert, awake, not in acute distress, pleasantly demented oriented to person only(his baseline) Cardiovascular: RRR, S1/S2 +, no rubs, no gallops Respiratory: CTA bilaterally, no wheezing, no rhonchi Abdominal: Soft, NT, ND, bowel sounds + Extremities: no edema, no cyanosis    The results of significant diagnostics from this hospitalization (including imaging, microbiology, ancillary and laboratory) are listed below for reference.     Microbiology: Recent Results (from the past 240 hour(s))  Resp Panel by RT-PCR (Flu A&B, Covid) Nasopharyngeal Swab     Status: None   Collection Time:  06/07/21 10:27 PM   Specimen: Nasopharyngeal Swab; Nasopharyngeal(NP) swabs in vial transport medium  Result Value Ref Range Status   SARS Coronavirus 2 by RT PCR NEGATIVE NEGATIVE Final    Comment: (NOTE) SARS-CoV-2 target nucleic acids are NOT DETECTED.  The SARS-CoV-2 RNA is generally detectable in upper respiratory specimens during the acute phase of infection. The lowest concentration of SARS-CoV-2 viral copies this assay can detect is 138 copies/mL. A negative result does not preclude SARS-Cov-2 infection and should not be used as the sole basis for treatment or other patient management decisions. A negative result may occur with  improper specimen collection/handling, submission of specimen other than nasopharyngeal swab, presence of viral mutation(s) within the areas targeted by this assay, and inadequate number of viral copies(<138 copies/mL). A negative result must be combined with clinical observations, patient history, and epidemiological information. The expected result is Negative.  Fact Sheet for Patients:  BloggerCourse.com  Fact Sheet for Healthcare Providers:  SeriousBroker.it  This test is no t yet approved or cleared by the [place] FDA and  has been authorized for detection and/or diagnosis of SARS-CoV-2 by FDA under an Emergency Use Authorization (EUA). This EUA will remain  in effect (meaning this test can be used) for the duration of the COVID-19 declaration under Section 564(b)(1) of the Act, 21 U.S.C.section 360bbb-3(b)(1), unless the authorization is terminated  or revoked sooner.       Influenza A by PCR NEGATIVE NEGATIVE Final   Influenza B by PCR NEGATIVE NEGATIVE Final    Comment: (NOTE) The Xpert Xpress SARS-CoV-2/FLU/RSV plus assay is intended as an aid in the diagnosis of influenza from Nasopharyngeal swab specimens and should not be used as a sole basis for treatment. Nasal washings  and aspirates are unacceptable for Xpert Xpress SARS-CoV-2/FLU/RSV testing.  Fact Sheet for Patients: BloggerCourse.com  Fact Sheet for Healthcare Providers: SeriousBroker.it  This test is not yet approved or cleared by the [place] FDA and has been authorized for detection and/or diagnosis of SARS-CoV-2 by FDA under an Emergency Use Authorization (EUA). This EUA will remain in effect (meaning this test can be used) for the duration of the COVID-19 declaration under Section 564(b)(1) of the Act, 21 U.S.C. section 360bbb-3(b)(1), unless the authorization is terminated or revoked.  Performed at [facility], [address] [place], [place] [address]      Labs: BNP (last 3 results) No results for input(s): BNP in the last 8760 hours. Basic Metabolic Panel: Recent Labs  Lab 06/07/21 1926 06/07/21 2227 06/09/21 0730  NA 134*  --  139  K 3.2*  --  3.2*  CL 104  --  107  CO2 23  --  25  GLUCOSE 199*  --  119*  BUN 12  --  18  CREATININE 1.09  --  1.18  CALCIUM 9.2  --  8.6*  MG  --  2.0  --  Liver Function Tests: Recent Labs  Lab 06/07/21 1926  AST 23  ALT 10  ALKPHOS 84  BILITOT 0.7  PROT 8.3*  ALBUMIN 4.1   No results for input(s): LIPASE, AMYLASE in the last 168 hours. Recent Labs  Lab 06/07/21 2227  AMMONIA 11   CBC: Recent Labs  Lab 06/07/21 1926 06/09/21 0730  WBC 7.5 5.8  NEUTROABS 4.0  --   HGB 14.3 12.9*  HCT 42.0 37.9*  MCV 86.4 86.3  PLT 219 180   Cardiac Enzymes: No results for input(s): CKTOTAL, CKMB, CKMBINDEX, TROPONINI in the last 168 hours. BNP: Invalid input(s): POCBNP CBG: Recent Labs  Lab 06/08/21 0750 06/08/21 1114 06/08/21 1709 06/08/21 2059 06/09/21 0849  GLUCAP 111* 153* 149* 132* 114*   D-Dimer No results for input(s): DDIMER in the last 72 hours. Hgb A1c Recent Labs    06/08/21 0613  HGBA1C 5.7*   Lipid Profile Recent Labs     06/08/21 0613  CHOL 169  HDL 34*  LDLCALC 113*  TRIG 112  CHOLHDL 5.0   Thyroid function studies No results for input(s): TSH, T4TOTAL, T3FREE, THYROIDAB in the last 72 hours.  Invalid input(s): FREET3 Anemia work up Recent Labs    06/07/21 2227  VITAMINB12 505   Urinalysis    Component Value Date/Time   COLORURINE YELLOW (A) 03/03/2021 1534   APPEARANCEUR HAZY (A) 03/03/2021 1534   LABSPEC 1.013 03/03/2021 1534   PHURINE 5.0 03/03/2021 1534   GLUCOSEU NEGATIVE 03/03/2021 1534   HGBUR NEGATIVE 03/03/2021 1534   BILIRUBINUR NEGATIVE 03/03/2021 1534   KETONESUR 20 (A) 03/03/2021 1534   PROTEINUR NEGATIVE 03/03/2021 1534   NITRITE NEGATIVE 03/03/2021 1534   LEUKOCYTESUR NEGATIVE 03/03/2021 1534   Sepsis Labs Invalid input(s): PROCALCITONIN,  WBC,  LACTICIDVEN Microbiology Recent Results (from the past 240 hour(s))  Resp Panel by RT-PCR (Flu A&B, Covid) Nasopharyngeal Swab     Status: None   Collection Time: 06/07/21 10:27 PM   Specimen: Nasopharyngeal Swab; Nasopharyngeal(NP) swabs in vial transport medium  Result Value Ref Range Status   SARS Coronavirus 2 by RT PCR NEGATIVE NEGATIVE Final    Comment: (NOTE) SARS-CoV-2 target nucleic acids are NOT DETECTED.  The SARS-CoV-2 RNA is generally detectable in upper respiratory specimens during the acute phase of infection. The lowest concentration of SARS-CoV-2 viral copies this assay can detect is 138 copies/mL. A negative result does not preclude SARS-Cov-2 infection and should not be used as the sole basis for treatment or other patient management decisions. A negative result may occur with  improper specimen collection/handling, submission of specimen other than nasopharyngeal swab, presence of viral mutation(s) within the areas targeted by this assay, and inadequate number of viral copies(<138 copies/mL). A negative result must be combined with clinical observations, patient history, and  epidemiological information. The expected result is Negative.  Fact Sheet for Patients:  BloggerCourse.com  Fact Sheet for Healthcare Providers:  SeriousBroker.it  This test is no t yet approved or cleared by the [place] FDA and  has been authorized for detection and/or diagnosis of SARS-CoV-2 by FDA under an Emergency Use Authorization (EUA). This EUA will remain  in effect (meaning this test can be used) for the duration of the COVID-19 declaration under Section 564(b)(1) of the Act, 21 U.S.C.section 360bbb-3(b)(1), unless the authorization is terminated  or revoked sooner.       Influenza A by PCR NEGATIVE NEGATIVE Final   Influenza B by PCR NEGATIVE NEGATIVE Final    Comment: (NOTE)  The Xpert Xpress SARS-CoV-2/FLU/RSV plus assay is intended as an aid in the diagnosis of influenza from Nasopharyngeal swab specimens and should not be used as a sole basis for treatment. Nasal washings and aspirates are unacceptable for Xpert Xpress SARS-CoV-2/FLU/RSV testing.  Fact Sheet for Patients: BloggerCourse.com  Fact Sheet for Healthcare Providers: SeriousBroker.it  This test is not yet approved or cleared by the [place] FDA and has been authorized for detection and/or diagnosis of SARS-CoV-2 by FDA under an Emergency Use Authorization (EUA). This EUA will remain in effect (meaning this test can be used) for the duration of the COVID-19 declaration under Section 564(b)(1) of the Act, 21 U.S.C. section 360bbb-3(b)(1), unless the authorization is terminated or revoked.  Performed at [facility], [address] [place], [place] [address]      Time coordinating discharge: Over 30 minutes  SIGNED:   Azucena Fallen, DO [facility] 06/09/2021, 11:32 AM Pager   If 7PM-7AM, please contact night-coverage [email or web address]

## 2021-06-09 NOTE — Progress Notes (Signed)
Cpap at bedside. Asked to leave available as patient has been agitated and in need of a sitter to remain safe. Patient resting quietly when seen and in no distress.

## 2021-06-13 LAB — VITAMIN B1: Vitamin B1 (Thiamine): 171.7 nmol/L (ref 66.5–200.0)

## 2021-09-20 ENCOUNTER — Other Ambulatory Visit: Payer: Self-pay

## 2021-09-20 ENCOUNTER — Emergency Department: Payer: No Typology Code available for payment source

## 2021-09-20 ENCOUNTER — Inpatient Hospital Stay
Admission: EM | Admit: 2021-09-20 | Discharge: 2021-09-22 | DRG: 312 | Payer: No Typology Code available for payment source | Source: Skilled Nursing Facility | Attending: Internal Medicine | Admitting: Internal Medicine

## 2021-09-20 DIAGNOSIS — E785 Hyperlipidemia, unspecified: Secondary | ICD-10-CM | POA: Diagnosis present

## 2021-09-20 DIAGNOSIS — K089 Disorder of teeth and supporting structures, unspecified: Secondary | ICD-10-CM

## 2021-09-20 DIAGNOSIS — Z8673 Personal history of transient ischemic attack (TIA), and cerebral infarction without residual deficits: Secondary | ICD-10-CM

## 2021-09-20 DIAGNOSIS — N390 Urinary tract infection, site not specified: Secondary | ICD-10-CM

## 2021-09-20 DIAGNOSIS — F03918 Unspecified dementia, unspecified severity, with other behavioral disturbance: Secondary | ICD-10-CM | POA: Diagnosis present

## 2021-09-20 DIAGNOSIS — M109 Gout, unspecified: Secondary | ICD-10-CM | POA: Diagnosis present

## 2021-09-20 DIAGNOSIS — Z880 Allergy status to penicillin: Secondary | ICD-10-CM

## 2021-09-20 DIAGNOSIS — R55 Syncope and collapse: Principal | ICD-10-CM | POA: Diagnosis present

## 2021-09-20 DIAGNOSIS — F32A Depression, unspecified: Secondary | ICD-10-CM | POA: Diagnosis present

## 2021-09-20 DIAGNOSIS — F0390 Unspecified dementia without behavioral disturbance: Secondary | ICD-10-CM | POA: Diagnosis present

## 2021-09-20 DIAGNOSIS — G4730 Sleep apnea, unspecified: Secondary | ICD-10-CM | POA: Diagnosis present

## 2021-09-20 DIAGNOSIS — N183 Chronic kidney disease, stage 3 unspecified: Secondary | ICD-10-CM | POA: Diagnosis present

## 2021-09-20 DIAGNOSIS — I129 Hypertensive chronic kidney disease with stage 1 through stage 4 chronic kidney disease, or unspecified chronic kidney disease: Secondary | ICD-10-CM | POA: Diagnosis present

## 2021-09-20 DIAGNOSIS — R9431 Abnormal electrocardiogram [ECG] [EKG]: Secondary | ICD-10-CM | POA: Diagnosis present

## 2021-09-20 DIAGNOSIS — R7301 Impaired fasting glucose: Secondary | ICD-10-CM

## 2021-09-20 DIAGNOSIS — N400 Enlarged prostate without lower urinary tract symptoms: Secondary | ICD-10-CM

## 2021-09-20 DIAGNOSIS — Z79899 Other long term (current) drug therapy: Secondary | ICD-10-CM

## 2021-09-20 DIAGNOSIS — G4700 Insomnia, unspecified: Secondary | ICD-10-CM | POA: Diagnosis present

## 2021-09-20 DIAGNOSIS — E1122 Type 2 diabetes mellitus with diabetic chronic kidney disease: Secondary | ICD-10-CM | POA: Diagnosis present

## 2021-09-20 DIAGNOSIS — Z7984 Long term (current) use of oral hypoglycemic drugs: Secondary | ICD-10-CM

## 2021-09-20 DIAGNOSIS — Z7982 Long term (current) use of aspirin: Secondary | ICD-10-CM

## 2021-09-20 LAB — BASIC METABOLIC PANEL
Anion gap: 7 (ref 5–15)
BUN: 17 mg/dL (ref 8–23)
CO2: 22 mmol/L (ref 22–32)
Calcium: 8 mg/dL — ABNORMAL LOW (ref 8.9–10.3)
Chloride: 108 mmol/L (ref 98–111)
Creatinine, Ser: 1.1 mg/dL (ref 0.61–1.24)
GFR, Estimated: >60 mL/min (ref >60)
Glucose, Bld: 139 mg/dL — ABNORMAL HIGH (ref 70–99)
Potassium: 3.6 mmol/L (ref 3.5–5.1)
Sodium: 137 mmol/L (ref 135–145)

## 2021-09-20 LAB — CBC WITH DIFFERENTIAL/PLATELET
Abs Immature Granulocytes: 0.02 10*3/uL (ref 0.00–0.07)
Basophils Absolute: 0.1 10*3/uL (ref 0.0–0.1)
Basophils Relative: 1 %
Eosinophils Absolute: 0.2 10*3/uL (ref 0.0–0.5)
Eosinophils Relative: 3 %
HCT: 37.3 % — ABNORMAL LOW (ref 39.0–52.0)
Hemoglobin: 12 g/dL — ABNORMAL LOW (ref 13.0–17.0)
Immature Granulocytes: 0 %
Lymphocytes Relative: 23 %
Lymphs Abs: 1.9 10*3/uL (ref 0.7–4.0)
MCH: 28.6 pg (ref 26.0–34.0)
MCHC: 32.2 g/dL (ref 30.0–36.0)
MCV: 88.8 fL (ref 80.0–100.0)
Monocytes Absolute: 0.6 10*3/uL (ref 0.1–1.0)
Monocytes Relative: 7 %
Neutro Abs: 5.3 10*3/uL (ref 1.7–7.7)
Neutrophils Relative %: 66 %
Platelets: 165 10*3/uL (ref 150–400)
RBC: 4.2 MIL/uL — ABNORMAL LOW (ref 4.22–5.81)
RDW: 14.1 % (ref 11.5–15.5)
WBC: 8.1 10*3/uL (ref 4.0–10.5)
nRBC: 0 % (ref 0.0–0.2)

## 2021-09-20 LAB — TROPONIN I (HIGH SENSITIVITY)
Troponin I (High Sensitivity): 18 ng/L — ABNORMAL HIGH (ref <18)
Troponin I (High Sensitivity): 19 ng/L — ABNORMAL HIGH (ref <18)

## 2021-09-20 NOTE — ED Triage Notes (Signed)
From memory care at [facility] via [place]. Pt was sitting in chair in the dining room, per staff lost consciousness for 15 seconds and fell out of chair hitting head on floor. Not on thinners. Hx dementia ? ?128/66 latest ?94% RA ?68 HR ?BGL 168 ?

## 2021-09-20 NOTE — ED Notes (Signed)
Pt attempted to use urinal but was unable to urinate. Bladder scan performed and greatest value was 126 mL. ?Brief clean and dry at this time. ?

## 2021-09-20 NOTE — ED Triage Notes (Signed)
Bruising noted to L frontal aspect of forehead ?

## 2021-09-20 NOTE — ED Provider Notes (Signed)
? ?[facility] ?Provider Note ? ? ? Event Date/Time  ? First MD Initiated Contact with Patient 09/20/21 1809   ?  (approximate) ? ? ?History  ? ?Loss of Consciousness ? ? ?HPI ? ?Pharaoh Pio Kinsel is a 81 y.o. male  who, per discharge summary dated 06/09/21 with history of HTN, dementia who had an admission for a syncopal episode, who presents to the emergency department today from living facility because of concerns for a syncopal episode.  Apparently the patient was sitting down when staff saw him pass out.  He did fall forward and hit his head.  The patient himself unfortunately cannot give any significant history secondary to dementia.  He does not complain of any pain. ? ?Physical Exam  ? ?Triage Vital Signs: ?ED Triage Vitals  ?Enc Vitals Group  ?   BP 09/20/21 1812 122/67  ?   Pulse Rate 09/20/21 1812 65  ?   Resp 09/20/21 1812 16  ?   Temp 09/20/21 1812 97.9 ?F (36.6 ?C)  ?   Temp Source 09/20/21 1812 Oral  ?   SpO2 09/20/21 1812 95 %  ?   Weight 09/20/21 1813 194 lb 0.1 oz (88 kg)  ?   Height 09/20/21 1813 5\' 9"  (1.753 m)  ?   Head Circumference --   ?   Peak Flow --   ?   Pain Score 09/20/21 1813 0  ? ?Most recent vital signs: ?Vitals:  ? 09/20/21 1812  ?BP: 122/67  ?Pulse: 65  ?Resp: 16  ?Temp: 97.9 ?F (36.6 ?C)  ?SpO2: 95%  ? ? ?General: Awake, no distress. Not oriented. ?CV:  Good peripheral perfusion.  ?Resp:  Normal effort. Lungs clear. ?Abd:  No distention. Non tender. ?MSK:  No extremity tenderness to palpation or manipulation. ? ? ?ED Results / Procedures / Treatments  ? ?Labs ?(all labs ordered are listed, but only abnormal results are displayed) ?Labs Reviewed  ?CBC WITH DIFFERENTIAL/PLATELET - Abnormal; Notable for the following components:  ?    Result Value  ? RBC 4.20 (*)   ? Hemoglobin 12.0 (*)   ? HCT 37.3 (*)   ? All other components within normal limits  ?BASIC METABOLIC PANEL - Abnormal; Notable for the following components:  ? Glucose, Bld 139 (*)   ? Calcium  8.0 (*)   ? All other components within normal limits  ?TROPONIN I (HIGH SENSITIVITY) - Abnormal; Notable for the following components:  ? Troponin I (High Sensitivity) 18 (*)   ? All other components within normal limits  ?TROPONIN I (HIGH SENSITIVITY) - Abnormal; Notable for the following components:  ? Troponin I (High Sensitivity) 19 (*)   ? All other components within normal limits  ?URINALYSIS, COMPLETE (UACMP) WITH MICROSCOPIC  ? ? ? ?EKG ? ?I06/01/23, attending physician, personally viewed and interpreted this EKG ? ?EKG Time: 1816 ?Rate: 63 ?Rhythm: sinus rhythm ?Axis: normal ?Intervals: qtc 498 ?QRS: narrow ?ST changes: no st elevation ?Impression: normal ekg ? ? ?RADIOLOGY ?I independently interpreted and visualized the CT head/cervical spine. My interpretation: No intracranial bleed. No acute osseous abnormality. ?Radiology interpretation:  ?IMPRESSION:  ?1. No acute intracranial abnormalities.  ?2. No fracture or traumatic malalignment in the cervical spine.  ?   ? ? ? ?PROCEDURES: ? ?Critical Care performed: No ? ?Procedures ? ? ?MEDICATIONS ORDERED IN ED: ?Medications - No data to display ? ? ?IMPRESSION / MDM / ASSESSMENT AND PLAN / ED COURSE  ?I reviewed  the triage vital signs and the nursing notes. ?             ?               ? ?Differential diagnosis includes, but is not limited to, syncope, anemia, infection, head trauma. ? ?Patient presents to the emergency department today from living facility after apparent syncopal episode and head trauma.  Head CT here without any concerning intracranial abnormality or osseous abnormality.  In terms of the syncopal episode troponin was minimally elevated however there was no significant change on repeat.  I doubt ACS.  No significant anemia or electrolyte abnormality.  Patient did have a hospitalization a few months ago for syncope with negative work-up at that time including echocardiogram.  Given recent work-up for syncope I do not necessarily  feel patient would benefit from repeat hospitalization.  Awaiting urine at time of signout. ? ? ? ?FINAL CLINICAL IMPRESSION(S) / ED DIAGNOSES  ? ?Final diagnoses:  ?Syncope, unspecified syncope type  ? ? ? ?Note:  This document was prepared using Dragon voice recognition software and may include unintentional dictation errors. ? ?  ?Phineas Semen, MD ?09/20/21 2327 ? ?

## 2021-09-20 NOTE — ED Notes (Signed)
Pt taken to CT at this time.

## 2021-09-20 NOTE — ED Notes (Signed)
Pt back from CT at this time. Pt given urinal and informed of need for urine sample, pt states they do not need to pee at this time.  ?

## 2021-09-21 ENCOUNTER — Encounter: Payer: Self-pay | Admitting: Internal Medicine

## 2021-09-21 ENCOUNTER — Observation Stay (HOSPITAL_COMMUNITY)
Admit: 2021-09-21 | Discharge: 2021-09-21 | Disposition: A | Discharge status: Home / Self Care | Payer: No Typology Code available for payment source | Attending: Internal Medicine | Admitting: Internal Medicine

## 2021-09-21 ENCOUNTER — Observation Stay: Payer: No Typology Code available for payment source

## 2021-09-21 DIAGNOSIS — G4730 Sleep apnea, unspecified: Secondary | ICD-10-CM | POA: Diagnosis present

## 2021-09-21 DIAGNOSIS — Z7982 Long term (current) use of aspirin: Secondary | ICD-10-CM | POA: Diagnosis not present

## 2021-09-21 DIAGNOSIS — N400 Enlarged prostate without lower urinary tract symptoms: Secondary | ICD-10-CM

## 2021-09-21 DIAGNOSIS — Z880 Allergy status to penicillin: Secondary | ICD-10-CM | POA: Diagnosis not present

## 2021-09-21 DIAGNOSIS — G4700 Insomnia, unspecified: Secondary | ICD-10-CM | POA: Diagnosis present

## 2021-09-21 DIAGNOSIS — F32A Depression, unspecified: Secondary | ICD-10-CM | POA: Diagnosis present

## 2021-09-21 DIAGNOSIS — R7301 Impaired fasting glucose: Secondary | ICD-10-CM

## 2021-09-21 DIAGNOSIS — N183 Chronic kidney disease, stage 3 unspecified: Secondary | ICD-10-CM | POA: Diagnosis present

## 2021-09-21 DIAGNOSIS — M109 Gout, unspecified: Secondary | ICD-10-CM | POA: Diagnosis present

## 2021-09-21 DIAGNOSIS — R55 Syncope and collapse: Secondary | ICD-10-CM | POA: Diagnosis present

## 2021-09-21 DIAGNOSIS — E1122 Type 2 diabetes mellitus with diabetic chronic kidney disease: Secondary | ICD-10-CM | POA: Diagnosis present

## 2021-09-21 DIAGNOSIS — E785 Hyperlipidemia, unspecified: Secondary | ICD-10-CM | POA: Diagnosis present

## 2021-09-21 DIAGNOSIS — F0390 Unspecified dementia without behavioral disturbance: Secondary | ICD-10-CM | POA: Diagnosis present

## 2021-09-21 DIAGNOSIS — G301 Alzheimer's disease with late onset: Secondary | ICD-10-CM | POA: Diagnosis not present

## 2021-09-21 DIAGNOSIS — F02C 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, severe, without behavioral disturbance, psychotic disturbance, mood disturbance, and anxiety: Secondary | ICD-10-CM

## 2021-09-21 DIAGNOSIS — Z7984 Long term (current) use of oral hypoglycemic drugs: Secondary | ICD-10-CM | POA: Diagnosis not present

## 2021-09-21 DIAGNOSIS — Z8673 Personal history of transient ischemic attack (TIA), and cerebral infarction without residual deficits: Secondary | ICD-10-CM | POA: Diagnosis not present

## 2021-09-21 DIAGNOSIS — R9431 Abnormal electrocardiogram [ECG] [EKG]: Secondary | ICD-10-CM

## 2021-09-21 DIAGNOSIS — Z79899 Other long term (current) drug therapy: Secondary | ICD-10-CM | POA: Diagnosis not present

## 2021-09-21 DIAGNOSIS — I129 Hypertensive chronic kidney disease with stage 1 through stage 4 chronic kidney disease, or unspecified chronic kidney disease: Secondary | ICD-10-CM | POA: Diagnosis present

## 2021-09-21 DIAGNOSIS — K089 Disorder of teeth and supporting structures, unspecified: Secondary | ICD-10-CM

## 2021-09-21 LAB — MAGNESIUM: Magnesium: 2.1 mg/dL (ref 1.7–2.4)

## 2021-09-21 LAB — URINALYSIS, COMPLETE (UACMP) WITH MICROSCOPIC
Bacteria, UA: NONE SEEN
Bilirubin Urine: NEGATIVE
Glucose, UA: NEGATIVE mg/dL
Ketones, ur: 20 mg/dL — AB
Nitrite: NEGATIVE
Protein, ur: 30 mg/dL — AB
Specific Gravity, Urine: 1.02 (ref 1.005–1.030)
pH: 5 (ref 5.0–8.0)

## 2021-09-21 LAB — CBC
HCT: 38.1 % — ABNORMAL LOW (ref 39.0–52.0)
Hemoglobin: 12.4 g/dL — ABNORMAL LOW (ref 13.0–17.0)
MCH: 28.8 pg (ref 26.0–34.0)
MCHC: 32.5 g/dL (ref 30.0–36.0)
MCV: 88.4 fL (ref 80.0–100.0)
Platelets: 166 10*3/uL (ref 150–400)
RBC: 4.31 MIL/uL (ref 4.22–5.81)
RDW: 13.8 % (ref 11.5–15.5)
WBC: 7.4 10*3/uL (ref 4.0–10.5)
nRBC: 0 % (ref 0.0–0.2)

## 2021-09-21 LAB — ECHOCARDIOGRAM COMPLETE
Area-P 1/2: 3.2 cm2
Height: 69 in
P 1/2 time: 473 msec
S' Lateral: 3.11 cm
Weight: 3104.08 oz

## 2021-09-21 LAB — CBG MONITORING, ED: Glucose-Capillary: 127 mg/dL — ABNORMAL HIGH (ref 70–99)

## 2021-09-21 LAB — BASIC METABOLIC PANEL
Anion gap: 7 (ref 5–15)
BUN: 15 mg/dL (ref 8–23)
CO2: 27 mmol/L (ref 22–32)
Calcium: 8.6 mg/dL — ABNORMAL LOW (ref 8.9–10.3)
Chloride: 108 mmol/L (ref 98–111)
Creatinine, Ser: 1.09 mg/dL (ref 0.61–1.24)
GFR, Estimated: >60 mL/min (ref >60)
Glucose, Bld: 118 mg/dL — ABNORMAL HIGH (ref 70–99)
Potassium: 3.8 mmol/L (ref 3.5–5.1)
Sodium: 142 mmol/L (ref 135–145)

## 2021-09-21 LAB — HEMOGLOBIN A1C
Hgb A1c MFr Bld: 5.9 % — ABNORMAL HIGH (ref 4.8–5.6)
Mean Plasma Glucose: 122.63 mg/dL

## 2021-09-21 LAB — GLUCOSE, CAPILLARY
Glucose-Capillary: 165 mg/dL — ABNORMAL HIGH (ref 70–99)
Glucose-Capillary: 143 mg/dL — ABNORMAL HIGH (ref 70–99)

## 2021-09-21 LAB — TSH: TSH: 4.767 u[IU]/mL — ABNORMAL HIGH (ref 0.350–4.500)

## 2021-09-21 MED ORDER — CEFDINIR 300 MG PO CAPS: 300.0000 mg | ORAL_CAPSULE | Freq: Two times a day (BID) | ORAL | Status: DC

## 2021-09-21 MED ORDER — QUETIAPINE FUMARATE 25 MG PO TABS: 12.5000 mg | ORAL_TABLET | Freq: Every day | ORAL | Status: DC

## 2021-09-21 MED ORDER — OLANZAPINE 10 MG IM SOLR
10.0000 mg | Freq: Once | INTRAMUSCULAR | Status: AC
  Administered 2021-09-21: 10 mg via INTRAMUSCULAR

## 2021-09-21 MED ORDER — MIRTAZAPINE 15 MG PO TABS
15.0000 mg | ORAL_TABLET | Freq: Every day | ORAL | Status: DC
  Administered 2021-09-21: 15 mg via ORAL
  Filled 2021-09-21: qty 1

## 2021-09-21 MED ORDER — OLANZAPINE 5 MG PO TABS: 5.0000 mg | ORAL_TABLET | Freq: Three times a day (TID) | ORAL | Status: DC | PRN

## 2021-09-21 MED ORDER — OLANZAPINE 10 MG IM SOLR
10.0000 mg | Freq: Three times a day (TID) | INTRAMUSCULAR | Status: DC | PRN
  Filled 2021-09-21: qty 10

## 2021-09-21 MED ORDER — SENNOSIDES-DOCUSATE SODIUM 8.6-50 MG PO TABS
2.0000 | ORAL_TABLET | Freq: Every day | ORAL | Status: DC
  Administered 2021-09-22: 2 via ORAL
  Filled 2021-09-21 (×2): qty 2

## 2021-09-21 MED ORDER — SODIUM CHLORIDE 0.9% FLUSH
3.0000 mL | Freq: Two times a day (BID) | INTRAVENOUS | Status: DC
  Administered 2021-09-21 – 2021-09-22 (×2): 3 mL via INTRAVENOUS

## 2021-09-21 MED ORDER — LORAZEPAM 2 MG/ML IJ SOLN
0.5000 mg | Freq: Once | INTRAMUSCULAR | Status: AC
  Administered 2021-09-21: 0.5 mg via INTRAVENOUS
  Filled 2021-09-21: qty 1

## 2021-09-21 MED ORDER — LORAZEPAM 0.5 MG PO TABS
0.5000 mg | ORAL_TABLET | Freq: Three times a day (TID) | ORAL | Status: DC | PRN
  Administered 2021-09-22 (×2): 0.5 mg via ORAL
  Filled 2021-09-21 (×3): qty 1

## 2021-09-21 MED ORDER — SODIUM CHLORIDE 0.9 % IV SOLN
1.0000 g | Freq: Once | INTRAVENOUS | Status: AC
  Administered 2021-09-21: 1 g via INTRAVENOUS
  Filled 2021-09-21: qty 10

## 2021-09-21 MED ORDER — METOPROLOL SUCCINATE ER 25 MG PO TB24
25.0000 mg | ORAL_TABLET | Freq: Every day | ORAL | Status: DC
  Administered 2021-09-21: 25 mg via ORAL
  Filled 2021-09-21: qty 1

## 2021-09-21 MED ORDER — OLANZAPINE 10 MG PO TABS
10.0000 mg | ORAL_TABLET | Freq: Every day | ORAL | Status: DC
  Administered 2021-09-21: 10 mg via ORAL
  Filled 2021-09-21: qty 1

## 2021-09-21 MED ORDER — TAMSULOSIN HCL 0.4 MG PO CAPS
0.4000 mg | ORAL_CAPSULE | Freq: Every day | ORAL | Status: DC
  Administered 2021-09-21 – 2021-09-22 (×2): 0.4 mg via ORAL
  Filled 2021-09-21 (×2): qty 1

## 2021-09-21 MED ORDER — GALANTAMINE HYDROBROMIDE ER 8 MG PO CP24
16.0000 mg | ORAL_CAPSULE | Freq: Every day | ORAL | Status: DC
  Administered 2021-09-21: 16 mg via ORAL
  Filled 2021-09-21 (×2): qty 2

## 2021-09-21 MED ORDER — POLYETHYLENE GLYCOL 3350 17 G PO PACK: 17.0000 g | PACK | Freq: Every day | ORAL | Status: DC | PRN

## 2021-09-21 MED ORDER — SODIUM CHLORIDE 0.9 % IV SOLN: INTRAVENOUS | Status: DC

## 2021-09-21 MED ORDER — SODIUM CHLORIDE 0.9 % IV BOLUS (SEPSIS)
500.0000 mL | Freq: Once | INTRAVENOUS | Status: AC
  Administered 2021-09-21: 500 mL via INTRAVENOUS

## 2021-09-21 MED ORDER — HALOPERIDOL LACTATE 5 MG/ML IJ SOLN
5.0000 mg | Freq: Once | INTRAMUSCULAR | Status: DC
Start: 2021-09-21 — End: 2021-09-21

## 2021-09-21 MED ORDER — DIVALPROEX SODIUM 250 MG PO DR TAB
250.0000 mg | DELAYED_RELEASE_TABLET | Freq: Every day | ORAL | Status: DC
  Administered 2021-09-21 – 2021-09-22 (×2): 250 mg via ORAL
  Filled 2021-09-21 (×2): qty 1

## 2021-09-21 MED ORDER — FOSFOMYCIN TROMETHAMINE 3 G PO PACK: 3.0000 g | PACK | Freq: Once | ORAL | Status: DC

## 2021-09-21 MED ORDER — MELATONIN 5 MG PO TABS
2.5000 mg | ORAL_TABLET | Freq: Every day | ORAL | Status: DC
  Administered 2021-09-21: 2.5 mg via ORAL
  Filled 2021-09-21: qty 1

## 2021-09-21 MED ORDER — ASPIRIN EC 81 MG PO TBEC
81.0000 mg | DELAYED_RELEASE_TABLET | Freq: Every day | ORAL | Status: DC
  Administered 2021-09-21 – 2021-09-22 (×2): 81 mg via ORAL
  Filled 2021-09-21 (×2): qty 1

## 2021-09-21 MED ORDER — PANTOPRAZOLE SODIUM 40 MG PO TBEC
40.0000 mg | DELAYED_RELEASE_TABLET | Freq: Every day | ORAL | Status: DC
  Administered 2021-09-21 – 2021-09-22 (×2): 40 mg via ORAL
  Filled 2021-09-21 (×2): qty 1

## 2021-09-21 MED ORDER — QUETIAPINE FUMARATE 25 MG PO TABS: 50.0000 mg | ORAL_TABLET | Freq: Every day | ORAL | Status: DC

## 2021-09-21 NOTE — ED Notes (Signed)
Student RN rounded on patient and is currently eating lunch.  ?

## 2021-09-21 NOTE — Assessment & Plan Note (Signed)
Patient is confused but not aggressive at this point.  Answers some questions appropriately. ?

## 2021-09-21 NOTE — ED Notes (Signed)
Student RN found patient eating breakfast, and gave morning medications. See MAR.  ?

## 2021-09-21 NOTE — ED Notes (Signed)
Student RN found patient standing at foot of bed standing in urine and attempting to put pants on. Student RN cleansed the room and patient of urine. Pt reconnected to VS monitor and IV pump. Pt calm and cooperate at this time.  ?

## 2021-09-21 NOTE — ED Notes (Signed)
Pt still combative and agitated. Attempting to obtain VS at this time.  ?

## 2021-09-21 NOTE — ED Notes (Signed)
Pt verbally aggressive toward staff. Pt confused and trying to leave. RN staff and security at bedside along with PA Jenise.  ?

## 2021-09-21 NOTE — ED Notes (Signed)
Student RN rounded on patient, offered toileting, and restarted IV fluids. Pt is currently resting.  ?

## 2021-09-21 NOTE — ED Notes (Signed)
RN in room to find pt had gotten to end of bed and urinated in floor. Pt and room cleansed of urine. Brief changed. Pt ambulated to bathroom and urinated for a second time. Pt reconnected to VS monitoring and reason for visit reiterated. Pt pleasant and cooperative at this time. ?

## 2021-09-21 NOTE — Assessment & Plan Note (Signed)
-   Continue Flomax 

## 2021-09-21 NOTE — ED Notes (Signed)
Pt still uncooperative. Trying to kick staff, confused.  ?

## 2021-09-21 NOTE — Assessment & Plan Note (Signed)
Patient is not a diabetic.  Hemoglobin A1c 5.9. ?

## 2021-09-21 NOTE — Progress Notes (Signed)
?  Progress Note ? ? ?Patient: Stanley Nielsen MPN:[id number] DOB: 1940/10/29 DOA: 09/20/2021     0 ?DOS: the patient was seen and examined on 09/21/2021 ?  ? ?Assessment and Plan: ?* Syncope ?The patient is not the best historian and unable to get much history from him.  As per family he was in a chair and then had a fall on left floor.  Patient not orthostatic this morning.  EKG showing a prolonged QT.  We will get rid of galantamine and decrease Seroquel dose.  Start low-dose Toprol at night.  Observe overnight.  IV fluids given overnight. ? ?Prolonged QT interval ?Decrease Seroquel dose to 12.5 nightly.  Discontinue galantamine.  Continue Remeron for now. ? ?Dementia (HCC) ?Patient is confused but not aggressive at this point.  Answers some questions appropriately. ? ?Poor dentition ?Patient given Rocephin in the emergency room for positive urinalysis.  Urinalysis does not look positive to me.  Awaiting urine culture.  He does have poor dentition and awaiting for teeth to come out next week.  We will start Augmentin for tomorrow. ? ?Impaired fasting glucose ?Patient is not a diabetic.  Hemoglobin A1c 5.9. ? ?BPH (benign prostatic hyperplasia) ?Continue Flomax ? ? ? ? ?  ? ?Subjective: Patient feels okay.  Offers no complaints.  Could not tell me why he is here.  Poor historian secondary to dementia.  Apparently patient was in a chair and had a fall onto the floor. ? ?Physical Exam: ?Vitals:  ? 09/21/21 0441 09/21/21 0600 09/21/21 0700 09/21/21 1100  ?BP:  140/69 132/76 (!) 165/79  ?Pulse:  78 68 85  ?Resp:  13 17 (!) 23  ?Temp: 98.7 ?F (37.1 ?C)     ?TempSrc: Oral     ?SpO2:  99% 95% 98%  ?Weight:      ?Height:      ? ?Physical Exam ?HENT:  ?   Head: Normocephalic.  ?   Mouth/Throat:  ?   Pharynx: No oropharyngeal exudate.  ?Eyes:  ?   General: Lids are normal.  ?   Conjunctiva/sclera: Conjunctivae normal.  ?Cardiovascular:  ?   Rate and Rhythm: Normal rate and regular rhythm.  ?   Heart sounds: Normal heart  sounds, S1 normal and S2 normal.  ?Pulmonary:  ?   Breath sounds: No decreased breath sounds, wheezing, rhonchi or rales.  ?Abdominal:  ?   Palpations: Abdomen is soft.  ?   Tenderness: There is no abdominal tenderness.  ?Musculoskeletal:  ?   Right lower leg: No swelling.  ?   Left lower leg: No swelling.  ?Skin: ?   General: Skin is warm.  ?   Findings: No rash.  ?Neurological:  ?   Mental Status: He is alert.  ?   Comments: Answer some questions appropriately others he is confused with.  ?  ?Data Reviewed: ?EKG reviewed by me he has a prolonged QTc of 490 on the last EKG. chemistry within normal limits except for glucose of 118.  Creatinine 1.09 with a GFR greater than 60.  Magnesium 2.1 yesterday potassium 3.8.  Hemoglobin 12.4 ? ?Family Communication: Spoke with Cyndra Numbers on the phone ? ?Disposition: ?Status is: Observation ?The patient remains OBS appropriate and will d/c before 2 midnights.  Monitoring overnight with prolonged QTc.  Starting low-dose beta-blocker and cutting back on some medications ? ?Planned Discharge Destination: [facility] likely tomorrow ? ?Author: ?Alford Highland, MD ?09/21/2021 12:25 PM ? ?For on call review [email or web address].  ?

## 2021-09-21 NOTE — ED Notes (Signed)
Pt awake, calm at this time ?

## 2021-09-21 NOTE — ED Notes (Signed)
Safety sitter at bedside 

## 2021-09-21 NOTE — ED Provider Notes (Addendum)
----------------------------------------- ?  3:19 PM on 09/21/2021 ?----------------------------------------- ?Patient has become combative and agitated spitting and attempting to swing at staff members.  Patient given 0.5 mg of IV Ativan with little resolved.  Patient does have a documented history of prolonged QTc.  I checked the patient's EKG from today and his QT is less than 500.  Patient did require brief manual hold for medication administration.  Hospitalist has now taken over medication administration and has ordered Zyprexa intramuscularly for the patient. ?  Minna Antis, MD ?09/21/21 1522 ? ?Hold was discontinued at 1552.  Patient now calm resting comfortably in bed. ? ?  ?Minna Antis, MD ?09/21/21 1554 ? ?

## 2021-09-21 NOTE — ED Provider Notes (Signed)
12:00 AM  Assumed care at shift change. Patient is an 81 yo M who is coming from a nursing facility after he had a brief syncopal episode.  Has had history of the same and was last admitted in December 2022 for this.  Today blood work is reassuring.  Normal hemoglobin, electrolytes, troponin x2 is flat.  He did fall out of his chair onto the floor.  CT of the head and cervical spine were obtained and I have reviewed these as has the radiologist and there is no acute traumatic injury.  No events noted on cardiac monitoring. ? ?EKG shows nonspecific T wave flattening and a slightly prolonged QT interval.  Potassium is normal.  We will add on magnesium and repeat his EKG. ? ? ?Urinalysis pending to rule out dehydration, UTI as a cause of his syncopal event today. ? ? ?Reviewed records from his last admission.  Echocardiogram on June 08, 2021.  Normal with normal ejection fraction. ? ?IMPRESSIONS  ? ? ? 1. Left ventricular ejection fraction, by estimation, is 60 to 65%. The  ?left ventricle has normal function. The left ventricle has no regional  ?wall motion abnormalities. Left ventricular diastolic parameters were  ?normal.  ? 2. Right ventricular systolic function is normal. The right ventricular  ?size is normal.  ? 3. The mitral valve is normal in structure. Mild mitral valve  ?regurgitation.  ? 4. The aortic valve is normal in structure. Aortic valve regurgitation is  ?mild.  ? ? ? EKG Interpretation ? ?Date/Time:  Saturday September 20 2021 18:16:05 EDT ?Ventricular Rate:  63 ?PR Interval:  186 ?QRS Duration: 93 ?QT Interval:  486 ?QTC Calculation: 498 ?R Axis:   -3 ?Text Interpretation: Sinus rhythm Abnormal R-wave progression, early transition Borderline T wave abnormalities Borderline prolonged QT interval Confirmed by Rochele Raring [phone]) on 09/21/2021 12:04:26 AM ?  ? ?  ? ? ? ?1:35 AM  Pt's repeat EKGs continue to show a prolonged QT interval that appears new compared to previous.  Magnesium level is normal.   His urine does appear infected.  We will add on a urine culture and give Rocephin.  He also has ketones in his urine.  Will give IV fluids.  Will discuss with hospitalist for admission. ? ? ? ? EKG Interpretation ? ?Date/Time:  Sunday September 21 2021 00:45:06 EDT ?Ventricular Rate:  64 ?PR Interval:  186 ?QRS Duration: 118 ?QT Interval:  498 ?QTC Calculation: 514 ?R Axis:   11 ?Text Interpretation: Normal sinus rhythm Nonspecific intraventricular conduction delay Low voltage, extremity leads Confirmed by Rochele Raring [phone]) on 09/21/2021 1:18:27 AM ?  ? ?  ? ? ?1:51 AM  Consulted and discussed patient's case with hospitalist, Dr. Maisie Fus.  I have recommended admission and consulting physician agrees and will place admission orders.  Patient (and family if present) agree with this plan.  ? ?I reviewed all nursing notes, vitals, pertinent previous records.  All labs, EKGs, imaging ordered have been independently reviewed and interpreted by myself. ? ? ?  ?Steed Kanaan, Layla Maw, DO ?09/21/21 0151 ? ?

## 2021-09-21 NOTE — Progress Notes (Signed)
The patient's agitation will give as needed IM Zyprexa.  This does not prolong the QT interval.  We will discontinue nighttime Seroquel and use Zyprexa 10 mg orally instead. ?

## 2021-09-21 NOTE — Progress Notes (Signed)
*  PRELIMINARY RESULTS* ?Echocardiogram ?2D Echocardiogram has been performed. ? ?Stanley Nielsen ?09/21/2021, 10:25 AM ?

## 2021-09-21 NOTE — ED Notes (Signed)
Admitting provider in room to assess pt. Pt requiring extensive therapeutic communication to allow assessment. Pt reluctantly allowed assessment. ?Pt now refusing full vital sign monitoring. Will reattempt at a later time. ?

## 2021-09-21 NOTE — Assessment & Plan Note (Addendum)
The patient is not the best historian and unable to get much history from him.  As per family he was in a chair and then had a fall on left floor.  Patient not orthostatic this morning.  EKG showing a prolonged QT.  We will get rid of galantamine and decrease Seroquel dose.  Start low-dose Toprol at night.  Observe overnight.  IV fluids given overnight. ?

## 2021-09-21 NOTE — Assessment & Plan Note (Signed)
Decrease Seroquel dose to 12.5 nightly.  Discontinue galantamine.  Continue Remeron for now. ?

## 2021-09-21 NOTE — Assessment & Plan Note (Signed)
Patient given Rocephin in the emergency room for positive urinalysis.  Urinalysis does not look positive to me.  Awaiting urine culture.  He does have poor dentition and awaiting for teeth to come out next week.  We will start Augmentin for tomorrow. ?

## 2021-09-21 NOTE — Evaluation (Signed)
Physical Therapy Evaluation ?Patient Details ?Name: Stanley Nielsen ?MRN: [id number] ?DOB: Jun 15, 1941 ?Today's Date: 09/21/2021 ? ?History of Present Illness ? Stanley Nielsen is a 81 y.o. male with medical history significant of for hypertension, insomnia, non-insulin-dependent diabetes mellitus, dementia, history of cerebral infarction, gout in remission, sleep apnea, CPAP dependent, depression, gout, hyperlipidemia, who presents fall from seated position with associated head trauma s/p syncopal episode. Of note patient presents BIBEMS from memory care at [facility] house and is unable to give history. Head CT negative for intracranial abnormalities or actue fracture or traumatic malalighment in the cspine. ?  ?Clinical Impression ? Patient reclining in bed upon arrival. Is pleasantly confused and unable to provide detailed history. Chart review and discussion with visitor contributed to history. Patient lives in memory care in assisted living. Prior to hospitalization he ambulated I with no AD and completed bathing independently. He reports he gets help with dressing or not depending on the person helping him. He reports he has had previous episodes like the one that lead to his hospitalization where he passes out and falls. Upon PT evaluation, patient was I for bed mobility and needed supervision for transfers and ambulation ~ 400 feet. He used the bathroom with supervision. He was able to complete backwards walking, lateral walking, and walking with horizontal and vertical head turns without needing physical assistance for balance. He did need frequent redirection and did not seem to have an accurate assessment of the situation based on his conversation and actions. SpO2 appeared to drop and fluctuate during mobilization but had difficulty getting a good pleth signal and patient was asymptomatic. Standing BP requested by MD during session was 151/76 mmHg with HR 91 bpm. Patient is most limited by  cognition and appears at baseline. He does not appear to need further PT services and will be discharged from PT caseload at this time.  ?   ? ?Recommendations for follow up therapy are one component of a multi-disciplinary discharge planning process, led by the attending physician.  Recommendations may be updated based on patient status, additional functional criteria and insurance authorization. ? ?Follow Up Recommendations No PT follow up ? ?  ?Assistance Recommended at Discharge Frequent or constant Supervision/Assistance  ?Patient can return home with the following ? A little help with walking and/or transfers;A little help with bathing/dressing/bathroom;Assistance with cooking/housework;Direct supervision/assist for financial management;Assist for transportation;Direct supervision/assist for medications management ? ?  ?Equipment Recommendations None recommended by PT  ?Recommendations for Other Services ?    ?  ?Functional Status Assessment Patient has not had a recent decline in their functional status  ? ?  ?Precautions / Restrictions Precautions ?Precautions: Fall ?Restrictions ?Weight Bearing Restrictions: No  ? ?  ? ?Mobility ? Bed Mobility ?Overal bed mobility: Independent ?  ?  ?  ?  ?  ?  ?General bed mobility comments: Pateint scooted off the end of the ED stretcher while the rails were up while PT was untingling lines. He transferred supine to sit I with flat ED stretcher. ?  ? ?Transfers ?Overall transfer level: Needs assistance ?Equipment used: None ?Transfers: Sit to/from Stand ?Sit to Stand: Supervision ?  ?  ?  ?  ?  ?General transfer comment: Pateint completed transfers to/from ED stretcher, standard height chair with supervision. He completed 5 Time Sit To Stand in 14 seconds with U UE assist and pushing off edge of chair with back of knees due to backward lean at times. He occasionally was off balance but was  able to regain balance without assistance. ?  ? ?Ambulation/Gait ?Ambulation/Gait  assistance: Supervision ?Gait Distance (Feet): 400 Feet ?Assistive device: None ?Gait Pattern/deviations: WFL(Within Functional Limits) ?Gait velocity: WFL ?  ?  ?General Gait Details: Patient ambulated up and down ED hallways with CGA progressing to supervision. He was able to ambulate backwards, sideways (crossing feet at times), with horizontal and vertical head turns. He had difficulty following directions at times and is impulsive but pleasant. ? ?Stairs ?  ?  ?  ?  ?  ? ?Wheelchair Mobility ?  ? ?Modified Rankin (Stroke Patients Only) ?  ? ?  ? ?Balance Overall balance assessment: Needs assistance ?  ?Sitting balance-Leahy Scale: Normal ?  ?  ?  ?Standing balance-Leahy Scale: Good ?  ?  ?  ?  ?  ?  ?  ?  ?High Level Balance Comments: patient completed 5 Time Sit To Stand Test in 14 seconds from standard height chair with U UE support and occasional backwards lean with back of knees supporting at chair. He was able to ambulate with horizontal and vertical head turns, backwards, and sideways without loss of balance that he could not correct with step strategy. ?  ?  ?  ?   ? ? ? ?Pertinent Vitals/Pain Pain Assessment ?Pain Assessment: No/denies pain  ? ? ?Home Living Family/patient expects to be discharged to:: Assisted living [facility](Memory Care) ?  ?  ?  ?  ?  ?  ?  ?  ?Home Equipment: None ?   ?  ?Prior Function Prior Level of Function : Needs assist ?  ?  ?  ?  ?  ?  ?Mobility Comments: Patient ambulated I with no AD ?ADLs Comments: independent with bathing and intermittantly independent with dressing. facility assists with IADLs. ?  ? ? ?Hand Dominance  ? Dominant Hand: Right ? ?  ?Extremity/Trunk Assessment  ? Upper Extremity Assessment ?Upper Extremity Assessment: Overall WFL for tasks assessed ?  ? ?Lower Extremity Assessment ?Lower Extremity Assessment: Overall WFL for tasks assessed ?  ? ?Cervical / Trunk Assessment ?Cervical / Trunk Assessment: Normal  ?Communication  ? Communication: No difficulties   ?Cognition Arousal/Alertness: Awake/alert ?Behavior During Therapy: Impulsive ?Overall Cognitive Status: History of cognitive impairments - at baseline ?  ?  ?  ?  ?  ?  ?  ?  ?  ?  ?  ?  ?  ?  ?  ?  ?General Comments: Pateint in memory care. He requires frequent redirection and reminders to stay on task. He is impulsive. He is not oriented to situation. Had trouble with his birth year. Was able to state name and part of birthdate. ?  ?  ? ?  ?General Comments General comments (skin integrity, edema, etc.): Vitals remained WFL on RA except SpO2 alarm kept going off but did not appear to be reading well with good pleth. Patient asymtomatic. Standing BP (Requested by MD) was 151/67 mmHg wiuth HR 91 bpm. ? ?  ?Exercises    ? ?Assessment/Plan  ?  ?PT Assessment Patient does not need any further PT services  ?PT Problem List Decreased safety awareness ? ?   ?  ?PT Treatment Interventions     ? ?PT Goals (Current goals can be found in the Care Plan section)  ?Acute Rehab PT Goals ?Patient Stated Goal: to go to the bathroom ?PT Goal Formulation: With patient ?Time For Goal Achievement: 10/05/21 ?Potential to Achieve Goals: Good ? ?  ?Frequency   ?  ? ? ?  Co-evaluation   ?  ?  ?  ?  ? ? ?  ?AM-PAC PT "6 Clicks" Mobility  ?Outcome Measure Help needed turning from your back to your side while in a flat bed without using bedrails?: A Little ?Help needed moving from lying on your back to sitting on the side of a flat bed without using bedrails?: A Little ?Help needed moving to and from a bed to a chair (including a wheelchair)?: A Little ?Help needed standing up from a chair using your arms (e.g., wheelchair or bedside chair)?: A Little ?Help needed to walk in hospital room?: A Little ?Help needed climbing 3-5 steps with a railing? : A Little ?6 Click Score: 18 ? ?  ?End of Session Equipment Utilized During Treatment: Gait belt ?Activity Tolerance: Patient tolerated treatment well ?Patient left: in bed;with call bell/phone  within reach;with bed alarm set ?Nurse Communication: Mobility status ?PT Visit Diagnosis: History of falling (Z91.81) ?  ? ?Time: 1104-1140 ?PT Time Calculation (min) (ACUTE ONLY): 36 min ? ? ?Charges:   PT Evaluation

## 2021-09-21 NOTE — H&P (Signed)
?History and Physical  ? ? ?Stanley Nielsen RVU:[id number] DOB: [birth year] DOA: 09/20/2021 ? ?PCP: Martie Round, NP  ?Patient coming from: Memory care  ? ?I have personally briefly reviewed patient's old medical records in [facility] ? ?Chief Complaint: syncopal episode with fall  ? ?HPI: Stanley Nielsen is a 81 y.o. male with medical history significant of for hypertension, insomnia, non-insulin-dependent diabetes mellitus, dementia, history of cerebral infarction, gout in remission, sleep apnea, CPAP dependent, depression, gout, hyperlipidemia, who presents fall from seated position with associated head trauma s/p syncopal episode. Of note patient presents BIBEMS from memory care at [facility] house and is unable to give history.  ? ?ED Course:  ?Vitlas  ?Afeb, bp 128/66, hr 68, rr16 sat 94% on  ra  ?SHU:OHFGB QTC 488 ?Labs: ?Wbc 8.1, hgb 12,pl165,  ?MS111, K 3.6, glu136, cr 1.1  ?BZ:MCEY 18 ,19 ?Mag 2.1 ?Ua: traceLE , RBC+, +WBC 6-10  ?Tx with fosfomycin x 1 in ed  ?As well as CTX  ? ?CT head  ?IMPRESSION: ?1. No acute intracranial abnormalities. ?2. No fracture or traumatic malalignment in the cervical spine. ?  ? ?Review of Systems: As per HPI otherwise 10 point review of systems negative.  ? ?Past Medical History:  ?Diagnosis Date  ? CKD (chronic kidney disease), stage III (HCC)   ? Dementia (HCC)   ? Depression   ? Gout   ? HOH (hard of hearing)   ? Hypertension   ? Sleep apnea   ? Vertigo   ? ? ?Past Surgical History:  ?Procedure Laterality Date  ? CATARACT EXTRACTION W/PHACO Left 04/30/2015  ? Procedure: CATARACT EXTRACTION PHACO AND INTRAOCULAR LENS PLACEMENT (IOC);  Surgeon: Galen Manila, MD;  Location: [facility] ORS;  Service: Ophthalmology;  Laterality: Left;  [place] 00:47 ?AP% 23.5 ?CDE 11.16 ?fluid pack lot # [id number]  ? COLONOSCOPY    ? ? ? reports that he has never smoked. He has never used smokeless tobacco. He reports current alcohol use. He reports that he does not use  drugs. ? ?Allergies  ?Allergen Reactions  ? Penicillins Hives  ?  Has patient had a PCN reaction causing immediate rash, facial/tongue/throat swelling, SOB or lightheadedness with hypotension: No ?Has patient had a PCN reaction causing severe rash involving mucus membranes or skin necrosis: No ?Has patient had a PCN reaction that required hospitalization: No ?Has patient had a PCN reaction occurring within the last 10 years: No ?If all of the above answers are "NO", then may proceed with Cephalosporin use.  ? Sulfa Antibiotics Nausea And Vomiting  ? ? ?Family History  ?Problem Relation Age of Onset  ? Dementia Mother   ? Alcoholism Father   ? ? ?Prior to Admission medications   ?Medication Sig Start Date End Date Taking? Authorizing Provider  ?aspirin EC 81 MG tablet Take 81 mg by mouth daily.     [provider]  ?clotrimazole (LOTRIMIN) 1 % cream Apply 1 application topically 2 (two) times daily.    [provider]  ?cyanocobalamin 1000 MCG tablet Take 1 tablet by mouth daily. 11/25/20   [provider]  ?divalproex (DEPAKOTE) 250 MG DR tablet Take 250 mg by mouth daily. 01/13/21   [provider]  ?galantamine (RAZADYNE ER) 16 MG 24 hr capsule Take 16 mg by mouth daily with breakfast.     [provider]  ?LORazepam (ATIVAN) 0.5 MG tablet Take 0.5 mg by mouth every 8 (eight) hours as needed for anxiety.  [provider]  ?melatonin 3 MG TABS tablet Take 3 mg by mouth at bedtime.    [provider]  ?metFORMIN (GLUCOPHAGE) 500 MG tablet Take 500 mg by mouth 2 (two) times daily with a meal.    [provider]  ?MIRALAX 17 GM/SCOOP powder Take 17 g by mouth daily as needed for mild constipation.  10/24/19   [provider]  ?mirtazapine (REMERON) 7.5 MG tablet Take 15 mg by mouth at bedtime. 12/17/20   [provider]  ?omeprazole (PRILOSEC) 20 MG capsule Take 20 mg by mouth daily.    [provider]  ?QUEtiapine  (SEROQUEL) 25 MG tablet Take 2 tablets by mouth at bedtime. 02/26/21   [provider]  ?sennosides-docusate sodium (SENOKOT-S) 8.6-50 MG tablet Take 2 tablets by mouth daily.    [provider]  ?tamsulosin (FLOMAX) 0.4 MG CAPS capsule Take 0.4 mg by mouth daily.    [provider]  ? ? ?Physical Exam: ?Vitals:  ? 09/20/21 2100 09/20/21 2200 09/20/21 2300 09/21/21 0000  ?BP: 115/61 116/60 (!) 113/56 (!) 123/58  ?Pulse: 70 70 63 65  ?Resp: 17 20 15 17   ?Temp:      ?TempSrc:      ?SpO2: 95% 92% 95% 98%  ?Weight:      ?Height:      ? ? ? ?Vitals:  ? 09/20/21 2100 09/20/21 2200 09/20/21 2300 09/21/21 0000  ?BP: 115/61 116/60 (!) 113/56 (!) 123/58  ?Pulse: 70 70 63 65  ?Resp: 17 20 15 17   ?Temp:      ?TempSrc:      ?SpO2: 95% 92% 95% 98%  ?Weight:      ?Height:      ?Constitutional: NAD, easily irritated comfortable ?Neck: normal, supple, no masses, no thyromegaly ?Respiratory: clear to auscultation bilaterally, no wheezing, no crackles. Normal respiratory effort. No accessory muscle use.  ?Cardiovascular: Regular rate and rhythm, no murmurs / rubs / gallops. No extremity edema. 2+ pedal pulses. No carotid bruits.  ?Abdomen: no tenderness, no masses palpated. No hepatosplenomegaly. Bowel sounds positive.  ?Musculoskeletal: no clubbing / cyanosis. No joint deformity upper and lower extremities. Good ROM, no contractures. Normal muscle tone.  ?Skin: no rashes, lesions, ulcers. No induration ?Neurologic: CN 2-12 grossly intact. Sensation intact ?normal. Strength 5/5 in all 4.  ?Psychiatric: Normal judgment and insight. Alert and oriented x 1 Normal mood.  ? ? ?Labs on Admission: I have personally reviewed following labs and imaging studies ? ?CBC: ?Recent Labs  ?Lab 09/20/21 ?1821  ?WBC 8.1  ?NEUTROABS 5.3  ?HGB 12.0*  ?HCT 37.3*  ?MCV 88.8  ?PLT 165  ? ?Basic Metabolic Panel: ?Recent Labs  ?Lab 09/20/21 ?1821 09/20/21 ?2015  ?NA 137  --   ?K 3.6  --   ?CL 108  --   ?CO2 22  --   ?GLUCOSE 139*  --    ?BUN 17  --   ?CREATININE 1.10  --   ?CALCIUM 8.0*  --   ?MG  --  2.1  ? ?GFR: ?Estimated Creatinine Clearance: 58.8 mL/min (by C-G formula based on SCr of 1.1 mg/dL). ?Liver Function Tests: ?No results for input(s): AST, ALT, ALKPHOS, BILITOT, PROT, ALBUMIN in the last 168 hours. ?No results for input(s): LIPASE, AMYLASE in the last 168 hours. ?No results for input(s): AMMONIA in the last 168 hours. ?Coagulation Profile: ?No results for input(s): INR, PROTIME in the last 168 hours. ?Cardiac Enzymes: ?No results for input(s): CKTOTAL, CKMB, CKMBINDEX, TROPONINI in  the last 168 hours. ?BNP (last 3 results) ?No results for input(s): PROBNP in the last 8760 hours. ?HbA1C: ?No results for input(s): HGBA1C in the last 72 hours. ?CBG: ?No results for input(s): GLUCAP in the last 168 hours. ?Lipid Profile: ?No results for input(s): CHOL, HDL, LDLCALC, TRIG, CHOLHDL, LDLDIRECT in the last 72 hours. ?Thyroid Function Tests: ?No results for input(s): TSH, T4TOTAL, FREET4, T3FREE, THYROIDAB in the last 72 hours. ?Anemia Panel: ?No results for input(s): VITAMINB12, FOLATE, FERRITIN, TIBC, IRON, RETICCTPCT in the last 72 hours. ?Urine analysis: ?   ?Component Value Date/Time  ? COLORURINE YELLOW (A) 09/20/2021 1821  ? APPEARANCEUR HAZY (A) 09/20/2021 1821  ? LABSPEC 1.020 09/20/2021 1821  ? PHURINE 5.0 09/20/2021 1821  ? [place] NEGATIVE 09/20/2021 1821  ? HGBUR SMALL (A) 09/20/2021 1821  ? [place] NEGATIVE 09/20/2021 1821  ? KETONESUR 20 (A) 09/20/2021 1821  ? PROTEINUR 30 (A) 09/20/2021 1821  ? NITRITE NEGATIVE 09/20/2021 1821  ? LEUKOCYTESUR TRACE (A) 09/20/2021 1821  ? ? ?Radiological Exams on Admission: ?CT Head Wo Contrast ? ?Result Date: 09/20/2021 ?CLINICAL DATA:  Pain after fall EXAM: CT HEAD WITHOUT CONTRAST CT CERVICAL SPINE WITHOUT CONTRAST TECHNIQUE: Multidetector CT imaging of the head and cervical spine was performed following the standard protocol without intravenous contrast. Multiplanar CT image  reconstructions of the cervical spine were also generated. RADIATION DOSE REDUCTION: This exam was performed according to the departmental dose-optimization program which includes automated exposure control, adjustment of the mA and/or

## 2021-09-22 MED ORDER — OLANZAPINE 15 MG PO TABS: 15.0000 mg | ORAL_TABLET | Freq: Every day | ORAL | 0 refills | Status: DC

## 2021-09-22 MED ORDER — METOPROLOL SUCCINATE ER 25 MG PO TB24: 25.0000 mg | ORAL_TABLET | Freq: Every day | ORAL | 0 refills | Status: DC

## 2021-09-22 MED ORDER — CEFDINIR 300 MG PO CAPS
300.0000 mg | ORAL_CAPSULE | Freq: Two times a day (BID) | ORAL | 0 refills | Status: AC
Start: 2021-09-22 — End: 2021-09-29

## 2021-09-22 NOTE — NC FL2 (Addendum)
?[place] MEDICAID FL2 LEVEL OF CARE SCREENING TOOL  ?  ? ?IDENTIFICATION  ?Patient Name: ?Robertt Whitty Inglett Birthdate: 1940/12/21 Sex: male Admission Date (Current Location): ?09/20/2021  ?[place] and [place] Number: ? [place] ?  Facility and Address:  ?[facility], [address], [place],  [address] ?     Provider Number: ?[id number]  ?Attending Physician Name and Address:  ?Loletha Grayer, MD ? Relative Name and Phone Number:  ?  ?   ?Current Level of Care: ?Hospital Recommended Level of Care: ?Memory Care, [place] Prior Approval Number: ?  ? ?Date Approved/Denied: ?  PASRR Number: ?  ? ?Discharge Plan: ?Other (Comment) (memory care) ?  ? ?Current Diagnoses: ?Patient Active Problem List  ? Diagnosis Date Noted  ? Prolonged QT interval 09/21/2021  ? Poor dentition 09/21/2021  ? Impaired fasting glucose 09/21/2021  ? BPH (benign prostatic hyperplasia) 09/21/2021  ? Syncope 06/07/2021  ? Aggressive behavior 05/05/2020  ? Pain due to onychomycosis of toenails of both feet 12/18/2019  ? Dermatitis 12/18/2019  ? Diabetes mellitus type 2 in nonobese [facility])   ? Stage 3 chronic kidney disease ([place])   ? OSA (obstructive sleep apnea)   ? Diastolic dysfunction   ? Hypokalemia   ? GERD (gastroesophageal reflux disease) 03/26/2017  ? Acute renal failure superimposed on stage 3 chronic kidney disease ([employer]) 03/26/2017  ? Ischemic stroke ([place]) 03/26/2017  ? Acute metabolic encephalopathy [id number]  ? Type II diabetes mellitus with renal manifestations ([place]) 03/26/2017  ? Dementia ([place])   ? Hypertension   ? ? ?Orientation RESPIRATION BLADDER Height & Weight   ?  ?Self ? Normal Incontinent Weight: 177 lb 4 oz (80.4 kg) ?Height:  5\' 9"  (175.3 cm)  ?BEHAVIORAL SYMPTOMS/MOOD NEUROLOGICAL BOWEL NUTRITION STATUS  ?    Continent Diet (heart healthy/carb modified)  ?AMBULATORY STATUS COMMUNICATION OF NEEDS Skin   ?Limited Assist Verbally Normal ?  ?  ?  ?    ?     ?      ? ? ?Personal Care Assistance Level of Assistance  ?Bathing, Dressing, Feeding Bathing Assistance: Limited assistance ?Feeding assistance: Independent ?Dressing Assistance: Limited assistance ?   ? ?Functional Limitations Info  ?Hearing, Speech, Sight Sight Info: Adequate ?Hearing Info: Adequate ?Speech Info: Adequate  ? ? ?SPECIAL CARE FACTORS FREQUENCY  ?    ?  ?  ?  ?  ?  ?  ?   ? ? ?Contractures Contractures Info: Not present  ? ? ?Additional Factors Info  ?Code Status, Allergies Code Status Info: full code ?Allergies Info: Penicillins, Sulfa Antibiotics ?  ?  ?  ?   ? ?Discharge Medications (09/22/2021):   ?aspirin EC 81 MG tablet ?Take 81 mg by mouth daily. ?   ?cefdinir 300 MG capsule ?Commonly known as: OMNICEF ?Take 1 capsule (300 mg total) by mouth every 12 (twelve) hours for 7 days. ?   ?clotrimazole 1 % cream ?Commonly known as: LOTRIMIN ?Apply 1 application topically 2 (two) times daily. ?   ?divalproex 250 MG DR tablet ?Commonly known as: DEPAKOTE ?Take 250 mg by mouth daily. ?   ?LORazepam 0.5 MG tablet ?Commonly known as: ATIVAN ?Take 0.5 mg by mouth every 8 (eight) hours as needed for anxiety. ?   ?melatonin 3 MG Tabs tablet ?Take 3 mg by mouth at bedtime. ?   ?metoprolol succinate 25 MG 24 hr tablet ?Commonly known as: TOPROL-XL ?Take 1 tablet (25 mg total) by mouth at bedtime. ?   ?  MiraLax 17 GM/SCOOP powder ?Generic drug: polyethylene glycol powder ?Take 17 g by mouth daily as needed for mild constipation. ?   ?mirtazapine 15 MG tablet ?Commonly known as: REMERON ?Take 15 mg by mouth at bedtime. ?   ?OLANZapine 15 MG tablet ?Commonly known as: ZYPREXA ?Take 1 tablet (15 mg total) by mouth at bedtime. ?   ?omeprazole 20 MG capsule ?Commonly known as: PRILOSEC ?Take 20 mg by mouth daily. ?   ?sennosides-docusate sodium 8.6-50 MG tablet ?Commonly known as: SENOKOT-S ?Take 2 tablets by mouth daily. ?   ?tamsulosin 0.4 MG Caps capsule ?Commonly known as: FLOMAX ?Take 0.4 mg by mouth daily.   ? ? ? ?Relevant Imaging Results: ? ?Relevant Lab Results: ? ? ?Additional Information ?[id number] ? ?Ellieanna Funderburg A Lindaann Gradilla, LCSW ? ? ? ? ?

## 2021-09-22 NOTE — Progress Notes (Signed)
Unable to get a.m. blood sugar do to patient hitting and kicking.  ?

## 2021-09-22 NOTE — TOC Transition Note (Signed)
Transition of Care (TOC) - CM/SW Discharge Note ? ? ?Patient Details  ?Name: Stanley Nielsen ?MRN: [id number] ?Date of Birth: February 24, 1941 ? ?Transition of Care (TOC) CM/SW Contact:  ?Zayin Valadez A Elvan Ebron, LCSW ?Phone Number: ?09/22/2021, 12:40 PM ? ? ?Clinical Narrative:   Clinical Social Worker facilitated patient discharge including contacting patient family and facility to confirm patient discharge plans.  Clinical information faxed to facility and family agreeable with plan.  CSW arranged ambulance transport via [facility] to [facility] .  RN to call [phone] for report prior to discharge. ? ? ? ? ? ? ?Final next level of care: Assisted Living ?Barriers to Discharge: No Barriers Identified ? ? ?Patient Goals and CMS Choice ?  ?  ?  ? ?Discharge Placement ?  ?           ?Patient chooses bed at:  (Return to [facility]) ?Patient to be transferred to facility by: [facility] ?Name of family member notified: [name] ?Patient and family notified of of transfer: 09/22/21 ? ?Discharge Plan and Services ?  ?  ?           ?  ?  ?  ?  ?  ?  ?  ?  ?  ?  ? ?Social Determinants of Health (SDOH) Interventions ?  ? ? ?Readmission Risk Interventions ?   ? View : No data to display.  ?  ?  ?  ? ? ? ? ? ?

## 2021-09-22 NOTE — Discharge Summary (Signed)
?Physician Discharge Summary ?  ?Patient: Stanley Nielsen MRN: [id number] DOB: [birth year]  ?Admit date:     09/20/2021  ?Discharge date: 09/22/21  ?Discharge Physician: Alford Highland  ? ?PCP: Martie Round, NP  ? ?Recommendations at discharge:  ? ? Follow up PCP 5 days ? ?Discharge Diagnoses: ?Principal Problem: ?  Syncope ?Active Problems: ?  Prolonged QT interval ?  Dementia (HCC) ?  Poor dentition ?  Impaired fasting glucose ?  BPH (benign prostatic hyperplasia) ? ? ?Hospital Course: ?Patient brought in for syncope episode.  Patient has dementia and is not the best historian. Could not tell me anything about what happened.  The patient was found to have a prolonged qt interval and toprol was started.  We stopped seroquel and galantamine.  Consideration to get rid of remeron in the future should be made. Zyprexa can be used with prolonged qt interval. The patient did have some agitation yesterday afternoon requiring medication.  I think the best thing for him would be to get him back to his familiar setting. ? ?Assessment and Plan: ?* Syncope ?The patient is not the best historian and unable to get much history from him.  As per family he was in a chair and then had a fall on left floor.  Patient not orthostatic.  EKG showing a prolonged QT.  We will get rid of galantamine and Seroquel dose.  Start low-dose Toprol at night.  Using zyprexa at night instead.  Consider getting rid of remeron as outpatient. ? ?Prolonged QT interval ?Discontinue galantamine and seroquel.  Continue Remeron for now but consider getting rid of this medication in the future. ? ?Dementia (HCC) ?Patient has agitation yesterday afternoon requiring medication.  Patient will do better in his familiar environment.  Likely will get worsening delerium if stays any longer in the hospital. ? ?Poor dentition ?Patient given Rocephin in the emergency room for positive urinalysis.  Urinalysis does not look positive to me.  UC only growing 2,000  colonies which is insignificant growth.  No treatment for this.  I did give abx for poor dentition prior to his teeth being pulled next week.  Recommend holding aspirin one week prior to teeth being pulled ? ?Impaired fasting glucose ?Patient is not a diabetic.  Hemoglobin A1c 5.9. ? ?BPH (benign prostatic hyperplasia) ?Continue Flomax ? ?CT scan shows no acute abnormailities ? ? ? ? ? ?  ? ? ?Consultants: None ?Procedures performed: none ?Disposition: [facility] memory care ?Diet recommendation:  ?Cardiac diet ?DISCHARGE MEDICATION: ?Allergies as of 09/22/2021   ? ?   Reactions  ? Penicillins Hives  ? Has patient had a PCN reaction causing immediate rash, facial/tongue/throat swelling, SOB or lightheadedness with hypotension: No ?Has patient had a PCN reaction causing severe rash involving mucus membranes or skin necrosis: No ?Has patient had a PCN reaction that required hospitalization: No ?Has patient had a PCN reaction occurring within the last 10 years: No ?If all of the above answers are "NO", then may proceed with Cephalosporin use.  ? Sulfa Antibiotics Nausea And Vomiting  ? ?  ? ?  ?Medication List  ?  ? ?STOP taking these medications   ? ?amLODipine 10 MG tablet ?Commonly known as: NORVASC ?  ?galantamine 16 MG 24 hr capsule ?Commonly known as: RAZADYNE ER ?  ?lisinopril 5 MG tablet ?Commonly known as: ZESTRIL ?  ?metFORMIN 500 MG tablet ?Commonly known as: GLUCOPHAGE ?  ?QUEtiapine 50 MG tablet ?Commonly known as: SEROQUEL ?  ? ?  ? ?  TAKE these medications   ? ?aspirin EC 81 MG tablet ?Take 81 mg by mouth daily. ?  ?cefdinir 300 MG capsule ?Commonly known as: OMNICEF ?Take 1 capsule (300 mg total) by mouth every 12 (twelve) hours for 7 days. ?  ?clotrimazole 1 % cream ?Commonly known as: LOTRIMIN ?Apply 1 application topically 2 (two) times daily. ?  ?divalproex 250 MG DR tablet ?Commonly known as: DEPAKOTE ?Take 250 mg by mouth daily. ?  ?LORazepam 0.5 MG tablet ?Commonly known as: ATIVAN ?Take 0.5 mg  by mouth every 8 (eight) hours as needed for anxiety. ?  ?melatonin 3 MG Tabs tablet ?Take 3 mg by mouth at bedtime. ?  ?metoprolol succinate 25 MG 24 hr tablet ?Commonly known as: TOPROL-XL ?Take 1 tablet (25 mg total) by mouth at bedtime. ?  ?MiraLax 17 GM/SCOOP powder ?Generic drug: polyethylene glycol powder ?Take 17 g by mouth daily as needed for mild constipation. ?  ?mirtazapine 15 MG tablet ?Commonly known as: REMERON ?Take 15 mg by mouth at bedtime. ?  ?OLANZapine 15 MG tablet ?Commonly known as: ZYPREXA ?Take 1 tablet (15 mg total) by mouth at bedtime. ?  ?omeprazole 20 MG capsule ?Commonly known as: PRILOSEC ?Take 20 mg by mouth daily. ?  ?sennosides-docusate sodium 8.6-50 MG tablet ?Commonly known as: SENOKOT-S ?Take 2 tablets by mouth daily. ?  ?tamsulosin 0.4 MG Caps capsule ?Commonly known as: FLOMAX ?Take 0.4 mg by mouth daily. ?  ? ?  ? ? Follow-up Information   ? ? Martie Round, NP. Schedule an appointment as soon as possible for a visit in 5 day(s).   ?Specialty: Nurse Practitioner ?Contact information: ?[address] ?[place] [address] ?[phone] ? ? ?  ?  ? ?  ?  ? ?  ? ?Discharge Exam: ?Filed Weights  ? 09/20/21 1813 09/22/21 0500  ?Weight: 88 kg 80.4 kg  ? ?Physical Exam ?HENT:  ?   Head: Normocephalic.  ?   Mouth/Throat:  ?   Pharynx: No oropharyngeal exudate.  ?Eyes:  ?   General: Lids are normal.  ?   Conjunctiva/sclera: Conjunctivae normal.  ?Cardiovascular:  ?   Rate and Rhythm: Normal rate and regular rhythm.  ?   Heart sounds: Normal heart sounds, S1 normal and S2 normal.  ?Pulmonary:  ?   Breath sounds: No decreased breath sounds, wheezing, rhonchi or rales.  ?Abdominal:  ?   Palpations: Abdomen is soft.  ?   Tenderness: There is no abdominal tenderness.  ?Musculoskeletal:  ?   Right lower leg: No swelling.  ?   Left lower leg: No swelling.  ?Skin: ?   General: Skin is warm.  ?   Findings: No rash.  ?Neurological:  ?   Mental Status: He is alert.  ?   Comments: Answer  some questions appropriately.  Able to recall episode from yesterday evening.  ?  ? ?Condition at discharge: fair ? ?The results of significant diagnostics from this hospitalization (including imaging, microbiology, ancillary and laboratory) are listed below for reference.  ? ?Imaging Studies: ?X-ray chest PA and lateral ? ?Result Date: 09/21/2021 ?CLINICAL DATA:  81 year old male with syncope and fall out of chair. EXAM: CHEST - 2 VIEW COMPARISON:  Chest radiographs 06/07/2021 and earlier. FINDINGS: AP and lateral views of the chest at 0457 hours. Lung volumes and mediastinal contours within normal limits, stable from last year. Visualized tracheal air column is within normal limits. Both lungs appear stable and clear. No pneumothorax or pleural effusion. Osteopenia.  No acute osseous abnormality identified. Negative visible bowel gas. IMPRESSION: No acute cardiopulmonary abnormality or acute traumatic injury identified. Electronically Signed   By: Odessa FlemingH  Hall M.D.   On: 09/21/2021 05:56  ? ?CT Head Wo Contrast ? ?Result Date: 09/20/2021 ?CLINICAL DATA:  Pain after fall EXAM: CT HEAD WITHOUT CONTRAST CT CERVICAL SPINE WITHOUT CONTRAST TECHNIQUE: Multidetector CT imaging of the head and cervical spine was performed following the standard protocol without intravenous contrast. Multiplanar CT image reconstructions of the cervical spine were also generated. RADIATION DOSE REDUCTION: This exam was performed according to the departmental dose-optimization program which includes automated exposure control, adjustment of the mA and/or kV according to patient size and/or use of iterative reconstruction technique. COMPARISON:  June 07, 2021 FINDINGS: CT HEAD FINDINGS Brain: No evidence of acute infarction, hemorrhage, hydrocephalus, extra-axial collection or mass lesion/mass effect. Lacunar infarcts in the cerebellar hemispheres are stable. Ventricular prominence is stable. Scattered white matter changes are stable.5 Vascular:  Calcified atherosclerosis is identified in the intracranial carotids. Skull: Normal. Negative for fracture or focal lesion. Sinuses/Orbits: Mucosal thickening is identified in scattered ethmoid air cells a

## 2021-09-22 NOTE — Discharge Instructions (Signed)
Hold aspirin for 7 days prior to teeth being removed ?

## 2021-09-23 LAB — URINE CULTURE: Culture: 2000 — AB

## 2021-10-01 ENCOUNTER — Emergency Department: Payer: No Typology Code available for payment source

## 2021-10-01 ENCOUNTER — Observation Stay
Admission: EM | Admit: 2021-10-01 | Discharge: 2021-10-03 | Disposition: A | Discharge status: Home / Self Care | Payer: No Typology Code available for payment source | Attending: Internal Medicine | Admitting: Internal Medicine

## 2021-10-01 DIAGNOSIS — F0390 Unspecified dementia without behavioral disturbance: Secondary | ICD-10-CM | POA: Diagnosis not present

## 2021-10-01 DIAGNOSIS — E119 Type 2 diabetes mellitus without complications: Secondary | ICD-10-CM | POA: Insufficient documentation

## 2021-10-01 DIAGNOSIS — N183 Chronic kidney disease, stage 3 unspecified: Secondary | ICD-10-CM | POA: Insufficient documentation

## 2021-10-01 DIAGNOSIS — R9431 Abnormal electrocardiogram [ECG] [EKG]: Secondary | ICD-10-CM | POA: Diagnosis present

## 2021-10-01 DIAGNOSIS — R4182 Altered mental status, unspecified: Secondary | ICD-10-CM

## 2021-10-01 DIAGNOSIS — G9341 Metabolic encephalopathy: Secondary | ICD-10-CM | POA: Diagnosis not present

## 2021-10-01 DIAGNOSIS — Z7982 Long term (current) use of aspirin: Secondary | ICD-10-CM | POA: Insufficient documentation

## 2021-10-01 DIAGNOSIS — I129 Hypertensive chronic kidney disease with stage 1 through stage 4 chronic kidney disease, or unspecified chronic kidney disease: Secondary | ICD-10-CM | POA: Insufficient documentation

## 2021-10-01 DIAGNOSIS — G4730 Sleep apnea, unspecified: Secondary | ICD-10-CM

## 2021-10-01 DIAGNOSIS — R410 Disorientation, unspecified: Secondary | ICD-10-CM | POA: Diagnosis present

## 2021-10-01 DIAGNOSIS — R000 Tachycardia, unspecified: Secondary | ICD-10-CM | POA: Insufficient documentation

## 2021-10-01 DIAGNOSIS — N179 Acute kidney failure, unspecified: Secondary | ICD-10-CM

## 2021-10-01 DIAGNOSIS — R001 Bradycardia, unspecified: Secondary | ICD-10-CM

## 2021-10-01 DIAGNOSIS — F03918 Unspecified dementia, unspecified severity, with other behavioral disturbance: Secondary | ICD-10-CM | POA: Diagnosis present

## 2021-10-01 DIAGNOSIS — J9601 Acute respiratory failure with hypoxia: Secondary | ICD-10-CM | POA: Diagnosis not present

## 2021-10-01 DIAGNOSIS — Z79899 Other long term (current) drug therapy: Secondary | ICD-10-CM | POA: Insufficient documentation

## 2021-10-01 DIAGNOSIS — I10 Essential (primary) hypertension: Secondary | ICD-10-CM | POA: Diagnosis present

## 2021-10-01 DIAGNOSIS — R4189 Other symptoms and signs involving cognitive functions and awareness: Secondary | ICD-10-CM

## 2021-10-01 DIAGNOSIS — R404 Transient alteration of awareness: Secondary | ICD-10-CM

## 2021-10-01 DIAGNOSIS — E1165 Type 2 diabetes mellitus with hyperglycemia: Secondary | ICD-10-CM

## 2021-10-01 LAB — TROPONIN I (HIGH SENSITIVITY)
Troponin I (High Sensitivity): 13 ng/L (ref <18)
Troponin I (High Sensitivity): 12 ng/L (ref <18)

## 2021-10-01 LAB — LACTIC ACID, PLASMA
Lactic Acid, Venous: 2.3 mmol/L (ref 0.5–1.9)
Lactic Acid, Venous: 1 mmol/L (ref 0.5–1.9)

## 2021-10-01 LAB — CBC WITH DIFFERENTIAL/PLATELET
Abs Immature Granulocytes: 0.01 10*3/uL (ref 0.00–0.07)
Basophils Absolute: 0.1 10*3/uL (ref 0.0–0.1)
Basophils Relative: 1 %
Eosinophils Absolute: 0.1 10*3/uL (ref 0.0–0.5)
Eosinophils Relative: 1 %
HCT: 37.1 % — ABNORMAL LOW (ref 39.0–52.0)
Hemoglobin: 11.9 g/dL — ABNORMAL LOW (ref 13.0–17.0)
Immature Granulocytes: 0 %
Lymphocytes Relative: 25 %
Lymphs Abs: 2.3 10*3/uL (ref 0.7–4.0)
MCH: 28.5 pg (ref 26.0–34.0)
MCHC: 32.1 g/dL (ref 30.0–36.0)
MCV: 89 fL (ref 80.0–100.0)
Monocytes Absolute: 0.5 10*3/uL (ref 0.1–1.0)
Monocytes Relative: 6 %
Neutro Abs: 6.2 10*3/uL (ref 1.7–7.7)
Neutrophils Relative %: 67 %
Platelets: 152 10*3/uL (ref 150–400)
RBC: 4.17 MIL/uL — ABNORMAL LOW (ref 4.22–5.81)
RDW: 14.1 % (ref 11.5–15.5)
WBC: 9.2 10*3/uL (ref 4.0–10.5)
nRBC: 0 % (ref 0.0–0.2)

## 2021-10-01 LAB — COMPREHENSIVE METABOLIC PANEL
ALT: 10 U/L (ref 0–44)
AST: 25 U/L (ref 15–41)
Albumin: 3.6 g/dL (ref 3.5–5.0)
Alkaline Phosphatase: 74 U/L (ref 38–126)
Anion gap: 9 (ref 5–15)
BUN: 21 mg/dL (ref 8–23)
CO2: 20 mmol/L — ABNORMAL LOW (ref 22–32)
Calcium: 8.3 mg/dL — ABNORMAL LOW (ref 8.9–10.3)
Chloride: 109 mmol/L (ref 98–111)
Creatinine, Ser: 1.57 mg/dL — ABNORMAL HIGH (ref 0.61–1.24)
GFR, Estimated: 44 mL/min — ABNORMAL LOW (ref >60)
Glucose, Bld: 223 mg/dL — ABNORMAL HIGH (ref 70–99)
Potassium: 4 mmol/L (ref 3.5–5.1)
Sodium: 138 mmol/L (ref 135–145)
Total Bilirubin: 1.1 mg/dL (ref 0.3–1.2)
Total Protein: 7.5 g/dL (ref 6.5–8.1)

## 2021-10-01 LAB — ETHANOL: Alcohol, Ethyl (B): <10 mg/dL (ref <10)

## 2021-10-01 LAB — VALPROIC ACID LEVEL: Valproic Acid Lvl: <10 ug/mL — ABNORMAL LOW (ref 50.0–100.0)

## 2021-10-01 LAB — LIPASE, BLOOD: Lipase: 44 U/L (ref 11–51)

## 2021-10-01 LAB — SALICYLATE LEVEL: Salicylate Lvl: <7 mg/dL — ABNORMAL LOW (ref 7.0–30.0)

## 2021-10-01 LAB — BRAIN NATRIURETIC PEPTIDE: B Natriuretic Peptide: 77.2 pg/mL (ref 0.0–100.0)

## 2021-10-01 LAB — ACETAMINOPHEN LEVEL: Acetaminophen (Tylenol), Serum: <10 ug/mL — ABNORMAL LOW (ref 10–30)

## 2021-10-01 MED ORDER — HALOPERIDOL LACTATE 5 MG/ML IJ SOLN
INTRAMUSCULAR | Status: AC
  Administered 2021-10-01: 5 mg via INTRAVENOUS
  Filled 2021-10-01: qty 1

## 2021-10-01 MED ORDER — LORAZEPAM 2 MG/ML IJ SOLN
2.0000 mg | Freq: Once | INTRAMUSCULAR | Status: AC
  Administered 2021-10-01: 2 mg via INTRAMUSCULAR
  Filled 2021-10-01: qty 1

## 2021-10-01 MED ORDER — HALOPERIDOL LACTATE 5 MG/ML IJ SOLN
INTRAMUSCULAR | Status: AC
  Filled 2021-10-01: qty 1

## 2021-10-01 MED ORDER — HALOPERIDOL LACTATE 5 MG/ML IJ SOLN
5.0000 mg | Freq: Once | INTRAMUSCULAR | Status: AC
Start: 2021-10-01 — End: 2021-10-01
  Administered 2021-10-01: 5 mg via INTRAMUSCULAR
  Filled 2021-10-01: qty 1

## 2021-10-01 NOTE — ED Provider Notes (Addendum)
? ?[facility] ?Provider Note ? ? ? Event Date/Time  ? First MD Initiated Contact with Patient 10/01/21 1904   ?  (approximate) ? ? ?History  ? ?Unresponsive ? ? ?HPI ? ?Stanley Nielsen is a 81 y.o. male patient comes in from nursing home memory care unit.  He was unresponsive.  His pupils were pinpoint.  He was bradycardic.  EMS gave him Narcan and atropine and he began to wake up.  He is now awake and confused and combative.  He is complaining of the EKG pads are cold and trying to pull off his oxygen.  I have ordered some Haldol for him to sedate him without interfering with his respirations or blood pressure ?  ? ? ?Physical Exam  ? ?Triage Vital Signs: ?ED Triage Vitals  ?Enc Vitals Group  ?   BP   ?   Pulse   ?   Resp   ?   Temp   ?   Temp src   ?   SpO2   ?   Weight   ?   Height   ?   Head Circumference   ?   Peak Flow   ?   Pain Score   ?   Pain Loc   ?   Pain Edu?   ?   Excl. in [place]?   ? ? ?Most recent vital signs: ?Vitals:  ? 10/01/21 2330 10/02/21 0000  ?BP: (!) 120/103 115/87  ?Pulse: 72 62  ?Resp: 18 16  ?Temp:    ?SpO2: 92% 93%  ? ? ? ?General: Awake, no distress.  Confused and combative ?Head normocephalic atraumatic ?Eyes pupils equal reactive extraocular movements intact pupils are small but not pinpoint ?CV:  Good peripheral perfusion.  Heart regular rate and rhythm no audible murmurs ?Resp:  Normal effort.  ?Abd:  No distention.  Abdomen soft does not appear to be tender ?Extremities: Trace edema ? ? ?ED Results / Procedures / Treatments  ? ?Labs ?(all labs ordered are listed, but only abnormal results are displayed) ?Labs Reviewed  ?COMPREHENSIVE METABOLIC PANEL - Abnormal; Notable for the following components:  ?    Result Value  ? CO2 20 (*)   ? Glucose, Bld 223 (*)   ? Creatinine, Ser 1.57 (*)   ? Calcium 8.3 (*)   ? GFR, Estimated 44 (*)   ? All other components within normal limits  ?ACETAMINOPHEN LEVEL - Abnormal; Notable for the following components:  ?  Acetaminophen (Tylenol), Serum <10 (*)   ? All other components within normal limits  ?LACTIC ACID, PLASMA - Abnormal; Notable for the following components:  ? Lactic Acid, Venous 2.3 (*)   ? All other components within normal limits  ?SALICYLATE LEVEL - Abnormal; Notable for the following components:  ? Salicylate Lvl Q000111Q (*)   ? All other components within normal limits  ?CBC WITH DIFFERENTIAL/PLATELET - Abnormal; Notable for the following components:  ? RBC 4.17 (*)   ? Hemoglobin 11.9 (*)   ? HCT 37.1 (*)   ? All other components within normal limits  ?URINALYSIS, ROUTINE W REFLEX MICROSCOPIC - Abnormal; Notable for the following components:  ? Color, Urine YELLOW (*)   ? APPearance HAZY (*)   ? Ketones, ur 5 (*)   ? All other components within normal limits  ?URINE DRUG SCREEN, QUALITATIVE ([facility] ONLY) - Abnormal; Notable for the following components:  ? Tricyclic, Ur Screen POSITIVE (*)   ? All other components  within normal limits  ?VALPROIC ACID LEVEL - Abnormal; Notable for the following components:  ? Valproic Acid Lvl <10 (*)   ? All other components within normal limits  ?BRAIN NATRIURETIC PEPTIDE  ?ETHANOL  ?LACTIC ACID, PLASMA  ?LIPASE, BLOOD  ?TROPONIN I (HIGH SENSITIVITY)  ?TROPONIN I (HIGH SENSITIVITY)  ? ? ? ?EKG ? ?EKG after his first 2-1/2 the Haldol shows normal sinus rhythm rate of 99 left axis no acute ST-T changes QTc is 4.97 which is the same as it was on the EKG from EMS prior to the Haldol ? ? ?RADIOLOGY ?Chest x-ray reviewed by me and later read by radiology shows only poor inspiration. ?Head CT read by radiology and reviewed by me that is the films were reviewed by me does not show any acute changes ? ? ?PROCEDURES: ? ?Critical Care performed: Critical care time half an hour.  This includes repeatedly evaluating the patient trying to get him to calm down sedating and reviewing his EKGs. ? ?Procedures ? ? ?MEDICATIONS ORDERED IN ED: ?Medications  ?haloperidol lactate (HALDOL) 5 MG/ML  injection (  Not Given 10/01/21 2023)  ?haloperidol lactate (HALDOL) 5 MG/ML injection (5 mg Intravenous Given 10/01/21 1909)  ?LORazepam (ATIVAN) injection 2 mg (2 mg Intramuscular Given 10/01/21 2206)  ?haloperidol lactate (HALDOL) injection 5 mg (5 mg Intramuscular Given 10/01/21 2207)  ? ? ? ?IMPRESSION / MDM / ASSESSMENT AND PLAN / ED COURSE  ?I reviewed the triage vital signs and the nursing notes. ?Patient is combative.  He is pulling off his leads and his nasal cannula etc. I am afraid he is going to pull off his IV.  I do not want to give him any Ativan or narcotics at this point but I will give him some Haldol we have given him 2-1/2 once repeated the EKG looks unchanged given another 2-1/2 followed by another 2-1/2 we will give him 1 more EKG to make sure everything is still good and a final 2-1/2 of Haldol and that should be good for him.  Really have not had to restrain him at all except for possibly 10 seconds when I held his arm to help the nurse put the mitten on just talk to him and the nurse left mitts on his hands. ? ? ? ?The patient is on the cardiac monitor to evaluate for evidence of arrhythmia and/or significant heart rate changes.  None have been seen ?Patient's lactic acid was initially elevated but has gone down.  His troponins been stable white count is stable differential is okay we finally were able to obtain urine which only showed tricyclic's. ? ?Patient is now medically cleared.  We will watch him overnight and make sure he stays stable plan on discharging him in the morning.  I am uncertain why he was unresponsive at home.  Why he responded to Narcan unless he got one of the opioids which are not detected in the tox screen.  He has not been bradycardic since he has been here.  Patient's valproic acid level is negative. ? ? ----------------------------------------- ?12:47 AM on 10/02/2021 ?----------------------------------------- ?I tried to call the patient daughter in [place] at this  time.  Now that I have everything back I can tell her what happened.  I have not been able to find any significant pathology.  Possibly he got pain medicine that he was not supposed to.  This would be very unusual but possible.  It would really explain his response to Narcan.  I will call  her again soon. ? ?----------------------------------------- ?1:17 AM on 10/02/2021 ?----------------------------------------- ?Discussed patient with his daughter in [place].  He she reports she has passed out several times in the past he had an echo the beginning of this month which did not show any major problems.  He apparently had a number of teeth pulled today which I had not been aware of.  He may actually have overdosed on some narcotic.  Additionally his sugars quite elevated which is something he has not had trouble with before he was reported to be at risk for diabetes but not diabetic in the past.  His GFR somewhat low compared to previously.  He may be dehydrated as well.  His daughter reports his mental status is much worse lately.  She was just here about a month ago to see him.  Possibly we can investigate this as well.  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 ?FINAL CLINICAL IMPRESSION(S) / ED DIAGNOSES  ? ?Final diagnoses:  ?Episode of unresponsiveness  ? ?This episode has resolved ? ?Rx / DC Orders  ? ?ED Discharge Orders   ? ? None  ? ?  ? ? ? ?Note:  This document was prepared using Dragon voice recognition software and may include unintentional dictation errors. ?  ?Nena Polio, MD ?10/02/21 0046 ? ?  ?Nena Polio, MD ?10/02/21 0120 ? ?

## 2021-10-01 NOTE — ED Notes (Signed)
Patient alert, combative. Attempting to kick and hit staff. Verbally threatening staff. Uncooperative with care. ?

## 2021-10-01 NOTE — ED Triage Notes (Signed)
Arrived via EMS from [facility]. EMS report patient had teeth pulled earlier today and was found unresponsive with pinpoint pupils, shallow respirations with resp rate of 8, oxygen saturation 86% and heart rate at 40. 18 G to right forearm. 1 mg Narcan IV, 0.5 mg atropine IV, 300 ml NS given. CBG 276. Patient alert and combative on arrival. Regular, spontaneous respirations. Moving all extremities. Talking but not answering questions appropriately.  ?

## 2021-10-02 DIAGNOSIS — J9601 Acute respiratory failure with hypoxia: Secondary | ICD-10-CM

## 2021-10-02 DIAGNOSIS — N179 Acute kidney failure, unspecified: Secondary | ICD-10-CM

## 2021-10-02 DIAGNOSIS — R001 Bradycardia, unspecified: Secondary | ICD-10-CM

## 2021-10-02 DIAGNOSIS — G9341 Metabolic encephalopathy: Secondary | ICD-10-CM

## 2021-10-02 LAB — URINE DRUG SCREEN, QUALITATIVE (ARMC ONLY)
Amphetamines, Ur Screen: NOT DETECTED
Barbiturates, Ur Screen: NOT DETECTED
Benzodiazepine, Ur Scrn: NOT DETECTED
Cannabinoid 50 Ng, Ur ~~LOC~~: NOT DETECTED
Cocaine Metabolite,Ur ~~LOC~~: NOT DETECTED
MDMA (Ecstasy)Ur Screen: NOT DETECTED
Methadone Scn, Ur: NOT DETECTED
Opiate, Ur Screen: NOT DETECTED
Phencyclidine (PCP) Ur S: NOT DETECTED
Tricyclic, Ur Screen: POSITIVE — AB

## 2021-10-02 LAB — URINALYSIS, ROUTINE W REFLEX MICROSCOPIC
Bilirubin Urine: NEGATIVE
Glucose, UA: NEGATIVE mg/dL
Hgb urine dipstick: NEGATIVE
Ketones, ur: 5 mg/dL — AB
Leukocytes,Ua: NEGATIVE
Nitrite: NEGATIVE
Protein, ur: NEGATIVE mg/dL
Specific Gravity, Urine: 1.016 (ref 1.005–1.030)
pH: 5 (ref 5.0–8.0)

## 2021-10-02 LAB — CBC
HCT: 39.3 % (ref 39.0–52.0)
Hemoglobin: 13 g/dL (ref 13.0–17.0)
MCH: 29.4 pg (ref 26.0–34.0)
MCHC: 33.1 g/dL (ref 30.0–36.0)
MCV: 88.9 fL (ref 80.0–100.0)
Platelets: 162 10*3/uL (ref 150–400)
RBC: 4.42 MIL/uL (ref 4.22–5.81)
RDW: 14.1 % (ref 11.5–15.5)
WBC: 8.2 10*3/uL (ref 4.0–10.5)
nRBC: 0 % (ref 0.0–0.2)

## 2021-10-02 LAB — GLUCOSE, CAPILLARY
Glucose-Capillary: 87 mg/dL (ref 70–99)
Glucose-Capillary: 94 mg/dL (ref 70–99)
Glucose-Capillary: 82 mg/dL (ref 70–99)
Glucose-Capillary: 86 mg/dL (ref 70–99)
Glucose-Capillary: 82 mg/dL (ref 70–99)

## 2021-10-02 LAB — CREATININE, SERUM
Creatinine, Ser: 1.27 mg/dL — ABNORMAL HIGH (ref 0.61–1.24)
GFR, Estimated: 57 mL/min — ABNORMAL LOW (ref >60)

## 2021-10-02 LAB — VITAMIN B12: Vitamin B-12: 306 pg/mL (ref 180–914)

## 2021-10-02 LAB — TSH: TSH: 1.861 u[IU]/mL (ref 0.350–4.500)

## 2021-10-02 MED ORDER — ONDANSETRON HCL 4 MG/2ML IJ SOLN: 4.0000 mg | Freq: Four times a day (QID) | INTRAMUSCULAR | Status: DC | PRN

## 2021-10-02 MED ORDER — HALOPERIDOL LACTATE 5 MG/ML IJ SOLN
INTRAMUSCULAR | Status: AC
  Filled 2021-10-02: qty 1

## 2021-10-02 MED ORDER — ACETAMINOPHEN 650 MG RE SUPP: 650.0000 mg | Freq: Four times a day (QID) | RECTAL | Status: DC | PRN

## 2021-10-02 MED ORDER — ENOXAPARIN SODIUM 40 MG/0.4ML IJ SOSY
40.0000 mg | PREFILLED_SYRINGE | INTRAMUSCULAR | Status: DC
  Administered 2021-10-02 – 2021-10-03 (×2): 40 mg via SUBCUTANEOUS
  Filled 2021-10-02 (×2): qty 0.4

## 2021-10-02 MED ORDER — ONDANSETRON HCL 4 MG PO TABS: 4.0000 mg | ORAL_TABLET | Freq: Four times a day (QID) | ORAL | Status: DC | PRN

## 2021-10-02 MED ORDER — HALOPERIDOL LACTATE 5 MG/ML IJ SOLN
2.0000 mg | Freq: Once | INTRAMUSCULAR | Status: AC
  Administered 2021-10-02: 2 mg via INTRAVENOUS

## 2021-10-02 MED ORDER — SODIUM CHLORIDE 0.9 % IV SOLN: INTRAVENOUS | Status: DC

## 2021-10-02 MED ORDER — HYDRALAZINE HCL 20 MG/ML IJ SOLN: 5.0000 mg | Freq: Four times a day (QID) | INTRAMUSCULAR | Status: DC | PRN

## 2021-10-02 MED ORDER — SODIUM CHLORIDE 0.9 % IV BOLUS
1000.0000 mL | Freq: Once | INTRAVENOUS | Status: AC
  Administered 2021-10-02: 1000 mL via INTRAVENOUS

## 2021-10-02 MED ORDER — INSULIN ASPART 100 UNIT/ML IJ SOLN: 0.0000 [IU] | INTRAMUSCULAR | Status: DC

## 2021-10-02 MED ORDER — ACETAMINOPHEN 325 MG PO TABS: 650.0000 mg | ORAL_TABLET | Freq: Four times a day (QID) | ORAL | Status: DC | PRN

## 2021-10-02 NOTE — Assessment & Plan Note (Addendum)
-   baseline creatinine ~ 1 ?- patient presents with increase in creat >0.3 mg/dL above baseline, creat increase >1.5x baseline presumed to have occurred within past 7 days PTA ?-Presumed volume depletion/prerenal ?- Creatinine 1.57 on admission.   ?- resolved with IVF ? ?

## 2021-10-02 NOTE — Assessment & Plan Note (Addendum)
-   Patient was initially unresponsive, became agitated after administration of Narcan, requiring Haldol and Ativan in the ED ?- Initial unresponsiveness suspected related to narcotics administered for treatment of postextraction dental pain; some probable delirium component too ?- rapid improvement and stable recovery; mentation has returned back to baseline.  ?

## 2021-10-02 NOTE — Assessment & Plan Note (Signed)
Delirium precautions 

## 2021-10-02 NOTE — Assessment & Plan Note (Addendum)
-   Will hold home Toprol initially ?- improved and remained asymptomatic  ?- okay to resume toprol at discharge ?

## 2021-10-02 NOTE — Assessment & Plan Note (Deleted)
CPAP if more alert and tolerating ?

## 2021-10-02 NOTE — Plan of Care (Signed)
?  Problem: Education: ?Goal: Knowledge of General Education information will improve ?Description: Including pain rating scale, medication(s)/side effects and non-pharmacologic comfort measures ?10/02/2021 0359 by Berneta Levins, RN ?Outcome: Progressing ?10/02/2021 0359 by Berneta Levins, RN ?Outcome: Progressing ?  ?Problem: Health Behavior/Discharge Planning: ?Goal: Ability to manage health-related needs will improve ?10/02/2021 0359 by Berneta Levins, RN ?Outcome: Progressing ?10/02/2021 0359 by Berneta Levins, RN ?Outcome: Progressing ?  ?Problem: Clinical Measurements: ?Goal: Ability to maintain clinical measurements within normal limits will improve ?10/02/2021 0359 by Berneta Levins, RN ?Outcome: Progressing ?10/02/2021 0359 by Berneta Levins, RN ?Outcome: Progressing ?Goal: Will remain free from infection ?10/02/2021 0359 by Berneta Levins, RN ?Outcome: Progressing ?10/02/2021 0359 by Berneta Levins, RN ?Outcome: Progressing ?Goal: Diagnostic test results will improve ?10/02/2021 0359 by Berneta Levins, RN ?Outcome: Progressing ?10/02/2021 0359 by Berneta Levins, RN ?Outcome: Progressing ?Goal: Respiratory complications will improve ?10/02/2021 0359 by Berneta Levins, RN ?Outcome: Progressing ?10/02/2021 0359 by Berneta Levins, RN ?Outcome: Progressing ?Goal: Cardiovascular complication will be avoided ?10/02/2021 0359 by Berneta Levins, RN ?Outcome: Progressing ?10/02/2021 0359 by Berneta Levins, RN ?Outcome: Progressing ?  ?Problem: Activity: ?Goal: Risk for activity intolerance will decrease ?10/02/2021 0359 by Berneta Levins, RN ?Outcome: Progressing ?10/02/2021 0359 by Berneta Levins, RN ?Outcome: Progressing ?  ?Problem: Nutrition: ?Goal: Adequate nutrition will be maintained ?10/02/2021 0359 by Berneta Levins, RN ?Outcome: Progressing ?10/02/2021 0359 by Berneta Levins, RN ?Outcome:  Progressing ?  ?Problem: Coping: ?Goal: Level of anxiety will decrease ?10/02/2021 0359 by Berneta Levins, RN ?Outcome: Progressing ?10/02/2021 0359 by Berneta Levins, RN ?Outcome: Progressing ?  ?Problem: Elimination: ?Goal: Will not experience complications related to bowel motility ?10/02/2021 0359 by Berneta Levins, RN ?Outcome: Progressing ?10/02/2021 0359 by Berneta Levins, RN ?Outcome: Progressing ?Goal: Will not experience complications related to urinary retention ?10/02/2021 0359 by Berneta Levins, RN ?Outcome: Progressing ?10/02/2021 0359 by Berneta Levins, RN ?Outcome: Progressing ?  ?Problem: Pain Managment: ?Goal: General experience of comfort will improve ?10/02/2021 0359 by Berneta Levins, RN ?Outcome: Progressing ?10/02/2021 0359 by Berneta Levins, RN ?Outcome: Progressing ?  ?Problem: Safety: ?Goal: Ability to remain free from injury will improve ?10/02/2021 0359 by Berneta Levins, RN ?Outcome: Progressing ?10/02/2021 0359 by Berneta Levins, RN ?Outcome: Progressing ?  ?Problem: Skin Integrity: ?Goal: Risk for impaired skin integrity will decrease ?10/02/2021 0359 by Berneta Levins, RN ?Outcome: Progressing ?10/02/2021 0359 by Berneta Levins, RN ?Outcome: Progressing ?  ?

## 2021-10-02 NOTE — Progress Notes (Signed)
?Progress Note ? ? ? ?Stanley Nielsen   ?JA:[id number]  ?DOB: 1940-08-14  ?DOA: 10/01/2021     0 ?PCP: Elisabeth Cara, NP ? ?Initial CC: AMS ? ?Hospital Course: ?Mr. Stanley Nielsen is an 81 yo male with PMH CKD3, dementia, depression, HTN, sleep apnea, DMII who presented to the hospital after becoming unresponsive.  He was recently hospitalized earlier in April after a syncopal episode and had medications adjusted at that time. ?It was reported that he had several teeth removed prior to admission with having received pain medication after the procedure. ?On arrival to the ER he was noted to be still confused, agitated, and combative.  He was given a dose of Haldol and Ativan. ? ?Interval History:  ?Resting in bed this morning hard to sleep but finally aroused to some sternal rub.  Did not make sense when trying to answer my questions.  He could follow a couple commands but was an extremely poor historian.  He continued to impulsively try to get out of bed setting off the bed alarm during our conversation. ? ?Assessment and Plan: ?* Acute metabolic encephalopathy ?- Patient was initially unresponsive, became agitated after administration of Narcan, requiring Haldol and Ativan in the ED ?- Initial unresponsiveness suspected related to narcotics administered for treatment of postextraction dental pain ?- still confused this morning and not able to answer questions appropriately; also trying to get out of bed impulsively  ?- continue IVF and monitoring for more improvement in mentation and ability to follow commands ? ?Acute respiratory failure with hypoxia ([place]) ?Intermittently hypoxic to 88.  Was 86% with EMS and with no baseline O2 requirement.  Also tachypneic ?Supplemental O2 to maintain sats over 92 and wean as tolerated ? ?Sinus bradycardia ?- Will hold home Toprol ?- too agitated and impulsive for monitoring at this time  ? ?Uncontrolled type 2 diabetes mellitus with hyperglycemia, without long-term current use  of insulin ([place]) ?Sliding scale insulin coverage ? ?AKI (acute kidney injury) ([place]) ?- baseline creatinine ~ 1 ?- patient presents with increase in creat >0.3 mg/dL above baseline, creat increase >1.5x baseline presumed to have occurred within past 7 days PTA ?-Presumed volume depletion/prerenal ?- Continue fluids ?- Creatinine 1.57 on admission.  Improved to 1.27 this morning ? ? ?Sleep apnea ?CPAP if more alert and tolerating ? ?Prolonged QT interval ?Avoid QT prolonging drugs.  Patient had discontinuation of galantamine and Seroquel during recent hospitalization from 4/2 to 09/22/2021 ? ?Dementia ([place]) ?Delirium precautions ? ?Hypertension ?As needed antihypertensives while n.p.o. ? ? ? ?Old records reviewed in assessment of this patient ? ?Antimicrobials: ? ? ?DVT prophylaxis:  ?enoxaparin (LOVENOX) injection 40 mg Start: 10/02/21 0800 ? ? ?Code Status:   Code Status: Full Code ? ?Disposition Plan: Back to nursing facility in 1 to 2 days ?Status is: Observation ? ?Objective: ?Blood pressure (!) 131/54, pulse 68, temperature 97.8 ?F (36.6 ?C), resp. rate 16, SpO2 100 %.  ?Examination:  ?Physical Exam ?Constitutional:   ?   Comments: Elderly gentleman laying in bed in no distress but grossly confused; difficult to redirect at times  ?HENT:  ?   Head: Normocephalic and atraumatic.  ?   Mouth/Throat:  ?   Mouth: Mucous membranes are dry.  ?   Comments: Dried blood appreciated on tongue ?Eyes:  ?   Extraocular Movements: Extraocular movements intact.  ?Cardiovascular:  ?   Rate and Rhythm: Normal rate and regular rhythm.  ?   Heart sounds: Normal heart sounds.  ?Pulmonary:  ?  Effort: Pulmonary effort is normal. No respiratory distress.  ?   Breath sounds: Normal breath sounds. No wheezing.  ?Abdominal:  ?   General: Bowel sounds are normal. There is no distension.  ?   Palpations: Abdomen is soft.  ?   Tenderness: There is no abdominal tenderness.  ?Musculoskeletal:     ?   General: Normal range of motion.  ?    Cervical back: Normal range of motion and neck supple.  ?Skin: ?   General: Skin is warm and dry.  ?Neurological:  ?   Comments: Not oriented at all.  Follows a couple basic commands.  Remains impulsive.  No perceived obvious focal deficits (moves all 4 extremities equally)  ?  ? ?Consultants:  ? ? ?Procedures:  ? ? ?Data Reviewed: ?Results for orders placed or performed during the hospital encounter of 10/01/21 (from the past 24 hour(s))  ?Comprehensive metabolic panel     Status: Abnormal  ? Collection Time: 10/01/21  7:06 PM  ?Result Value Ref Range  ? Sodium 138 135 - 145 mmol/L  ? Potassium 4.0 3.5 - 5.1 mmol/L  ? Chloride 109 98 - 111 mmol/L  ? CO2 20 (L) 22 - 32 mmol/L  ? Glucose, Bld 223 (H) 70 - 99 mg/dL  ? BUN 21 8 - 23 mg/dL  ? Creatinine, Ser 1.57 (H) 0.61 - 1.24 mg/dL  ? Calcium 8.3 (L) 8.9 - 10.3 mg/dL  ? Total Protein 7.5 6.5 - 8.1 g/dL  ? Albumin 3.6 3.5 - 5.0 g/dL  ? AST 25 15 - 41 U/L  ? ALT 10 0 - 44 U/L  ? Alkaline Phosphatase 74 38 - 126 U/L  ? Total Bilirubin 1.1 0.3 - 1.2 mg/dL  ? GFR, Estimated 44 (L) >60 mL/min  ? Anion gap 9 5 - 15  ?Brain natriuretic peptide     Status: None  ? Collection Time: 10/01/21  7:06 PM  ?Result Value Ref Range  ? B Natriuretic Peptide 77.2 0.0 - 100.0 pg/mL  ?Acetaminophen level     Status: Abnormal  ? Collection Time: 10/01/21  7:06 PM  ?Result Value Ref Range  ? Acetaminophen (Tylenol), Serum <10 (L) 10 - 30 ug/mL  ?Ethanol     Status: None  ? Collection Time: 10/01/21  7:06 PM  ?Result Value Ref Range  ? Alcohol, Ethyl (B) <10 <10 mg/dL  ?Lactic acid, plasma     Status: Abnormal  ? Collection Time: 10/01/21  7:06 PM  ?Result Value Ref Range  ? Lactic Acid, Venous 2.3 (HH) 0.5 - 1.9 mmol/L  ?Lipase, blood     Status: None  ? Collection Time: 10/01/21  7:06 PM  ?Result Value Ref Range  ? Lipase 44 11 - 51 U/L  ?Troponin I (High Sensitivity)     Status: None  ? Collection Time: 10/01/21  7:06 PM  ?Result Value Ref Range  ? Troponin I (High Sensitivity) 13 <18  ng/L  ?Salicylate level     Status: Abnormal  ? Collection Time: 10/01/21  7:06 PM  ?Result Value Ref Range  ? Salicylate Lvl Q000111Q (L) 7.0 - 30.0 mg/dL  ?CBC with Differential     Status: Abnormal  ? Collection Time: 10/01/21  7:06 PM  ?Result Value Ref Range  ? WBC 9.2 4.0 - 10.5 K/uL  ? RBC 4.17 (L) 4.22 - 5.81 MIL/uL  ? Hemoglobin 11.9 (L) 13.0 - 17.0 g/dL  ? HCT 37.1 (L) 39.0 - 52.0 %  ? MCV 89.0  80.0 - 100.0 fL  ? MCH 28.5 26.0 - 34.0 pg  ? MCHC 32.1 30.0 - 36.0 g/dL  ? RDW 14.1 11.5 - 15.5 %  ? Platelets 152 150 - 400 K/uL  ? nRBC 0.0 0.0 - 0.2 %  ? Neutrophils Relative % 67 %  ? Neutro Abs 6.2 1.7 - 7.7 K/uL  ? Lymphocytes Relative 25 %  ? Lymphs Abs 2.3 0.7 - 4.0 K/uL  ? Monocytes Relative 6 %  ? Monocytes Absolute 0.5 0.1 - 1.0 K/uL  ? Eosinophils Relative 1 %  ? Eosinophils Absolute 0.1 0.0 - 0.5 K/uL  ? Basophils Relative 1 %  ? Basophils Absolute 0.1 0.0 - 0.1 K/uL  ? Immature Granulocytes 0 %  ? Abs Immature Granulocytes 0.01 0.00 - 0.07 K/uL  ?Lactic acid, plasma     Status: None  ? Collection Time: 10/01/21 10:07 PM  ?Result Value Ref Range  ? Lactic Acid, Venous 1.0 0.5 - 1.9 mmol/L  ?Valproic acid level     Status: Abnormal  ? Collection Time: 10/01/21 10:08 PM  ?Result Value Ref Range  ? Valproic Acid Lvl <10 (L) 50.0 - 100.0 ug/mL  ?Troponin I (High Sensitivity)     Status: None  ? Collection Time: 10/01/21 10:08 PM  ?Result Value Ref Range  ? Troponin I (High Sensitivity) 12 <18 ng/L  ?Urinalysis, Routine w reflex microscopic     Status: Abnormal  ? Collection Time: 10/01/21 11:50 PM  ?Result Value Ref Range  ? Color, Urine YELLOW (A) YELLOW  ? APPearance HAZY (A) CLEAR  ? Specific Gravity, Urine 1.016 1.005 - 1.030  ? pH 5.0 5.0 - 8.0  ? Glucose, UA NEGATIVE NEGATIVE mg/dL  ? Hgb urine dipstick NEGATIVE NEGATIVE  ? Bilirubin Urine NEGATIVE NEGATIVE  ? Ketones, ur 5 (A) NEGATIVE mg/dL  ? Protein, ur NEGATIVE NEGATIVE mg/dL  ? Nitrite NEGATIVE NEGATIVE  ? Leukocytes,Ua NEGATIVE NEGATIVE  ?Urine  Drug Screen, Qualitative     Status: Abnormal  ? Collection Time: 10/01/21 11:50 PM  ?Result Value Ref Range  ? Tricyclic, Ur Screen POSITIVE (A) NONE DETECTED  ? Amphetamines, Ur Screen NONE DETECTED NONE DETEC

## 2021-10-02 NOTE — Assessment & Plan Note (Addendum)
Intermittently hypoxic to 88.  Was 86% with EMS and with no baseline weaned off prior to discharge  ?

## 2021-10-02 NOTE — H&P (Signed)
?History and Physical  ? ? ?Patient: Stanley Nielsen [id number] DOB: 02-Feb-1941 ?DOA: 10/01/2021 ?DOS: the patient was seen and examined on 10/02/2021 ?PCP: Elisabeth Cara, NP  ?Patient coming from: ALF/ILF ? ?Chief Complaint:  ?Chief Complaint  ?Patient presents with  ? Unresponsive  ? ? ?HPI: Stanley Nielsen is a 81 y.o. male with medical history significant for DM, HTN, noninsulin-dependent type 2 diabetes, dementia on the memory care unit of assisted living, OSA on CPAP, hospitalized from 4/2 to 09/22/2021 with a syncopal episode with work-up significant for QT prolongation, otherwise unremarkable, discharged to facility with discontinuation of galantamine and Seroquel who was brought in by EMS for evaluation of unresponsiveness.  Patient's recent hospital stay was complicated by intermittent agitation related to his dementia.  Earlier in the day of arrival, patient underwent multiple tooth extraction .  Several hours later he was found unresponsive.  On arrival of EMS, O2 sat was 86% on room air and heart rate was 40.  He was administered Narcan and atropine in route.  He was combative on arrival and unable to contribute to history. ?ED course: On arrival tachypneic to 24 with BP of 125/100, pulse 97 and afebrile.  He was intermittently bradycardic to 58, and intermittently hypoxic to 88.  EKG, personally viewed and interpreted showed sinus at 99 with borderline prolonged QT interval of 4 7.  On his blood work, WBC normal with normal lactic acid, hemoglobin 11.9.  Blood glucose 223, creatinine 1.57 above baseline of 1.08.  Lipase and LFTs WNL.  UDS, EtOH, acetaminophen and salicylic acid levels unremarkable.  Urinalysis showed 5 ketones, and otherwise unremarkable.  CT head with no acute intracranial abnormality and chest x-ray no active disease. ?The emergency room provider got in contact with patient's daughter who lives in [place] who was recently 3 here who stated that patient has had several  prior syncopal episodes with negative work-up . ?While in the ED, patient was administered Haldol and Ativan due to combativeness.  Hospitalist consulted for admission.  ?Review of Systems  ?Unable to perform ROS: Mental status change  ? ? ?Past Medical History:  ?Diagnosis Date  ? CKD (chronic kidney disease), stage III ([place])   ? Dementia ([place])   ? Depression   ? Gout   ? HOH (hard of hearing)   ? Hypertension   ? Sleep apnea   ? Vertigo   ? ?Past Surgical History:  ?Procedure Laterality Date  ? CATARACT EXTRACTION W/PHACO Left 04/30/2015  ? Procedure: CATARACT EXTRACTION PHACO AND INTRAOCULAR LENS PLACEMENT (IOC);  Surgeon: Birder Robson, MD;  Location: [facility] ORS;  Service: Ophthalmology;  Laterality: Left;  [place] 00:47 ?AP% 23.5 ?CDE 11.16 ?fluid pack lot # [id number]  ? COLONOSCOPY    ? ?Social History:  reports that he has never smoked. He has never used smokeless tobacco. He reports current alcohol use. He reports that he does not use drugs. ? ?Allergies  ?Allergen Reactions  ? Penicillins Hives  ?  Has patient had a PCN reaction causing immediate rash, facial/tongue/throat swelling, SOB or lightheadedness with hypotension: No ?Has patient had a PCN reaction causing severe rash involving mucus membranes or skin necrosis: No ?Has patient had a PCN reaction that required hospitalization: No ?Has patient had a PCN reaction occurring within the last 10 years: No ?If all of the above answers are "NO", then may proceed with Cephalosporin use.  ? Sulfa Antibiotics Nausea And Vomiting  ? ? ?Family History  ?Problem Relation Age of Onset  ?  Dementia Mother   ? Alcoholism Father   ? ? ?Prior to Admission medications   ?Medication Sig Start Date End Date Taking? Authorizing Provider  ?hydrALAZINE (APRESOLINE) 10 MG tablet Take 10 mg by mouth 2 (two) times daily as needed. 09/08/21  Yes [provider]  ?OLANZapine (ZYPREXA) 15 MG tablet Take 1 tablet (15 mg total) by mouth at bedtime. 09/22/21  Yes Wieting, Richard, MD   ?oxymetazoline (AFRIN) 0.05 % nasal spray Place 1 spray into both nostrils 2 (two) times daily as needed for congestion.   Yes [provider]  ?tamsulosin (FLOMAX) 0.4 MG CAPS capsule Take 0.4 mg by mouth daily.   Yes [provider]  ?aspirin EC 81 MG tablet Take 81 mg by mouth daily.  ?Patient not taking: Reported on 10/01/2021    [provider]  ?clotrimazole (LOTRIMIN) 1 % cream Apply 1 application topically 2 (two) times daily. ?Patient not taking: Reported on 10/01/2021    [provider]  ?divalproex (DEPAKOTE) 250 MG DR tablet Take 250 mg by mouth daily. ?Patient not taking: Reported on 10/01/2021 01/13/21   [provider]  ?LORazepam (ATIVAN) 0.5 MG tablet Take 0.5 mg by mouth every 8 (eight) hours as needed for anxiety. ?Patient not taking: Reported on 10/01/2021    [provider]  ?melatonin 3 MG TABS tablet Take 3 mg by mouth at bedtime. ?Patient not taking: Reported on 10/01/2021    [provider]  ?metoprolol succinate (TOPROL-XL) 25 MG 24 hr tablet Take 1 tablet (25 mg total) by mouth at bedtime. ?Patient not taking: Reported on 10/01/2021 09/22/21   Loletha Grayer, MD  ?[facility] 17 GM/SCOOP powder Take 17 g by mouth daily as needed for mild constipation.  ?Patient not taking: Reported on 10/01/2021 10/24/19   [provider]  ?mirtazapine (REMERON) 15 MG tablet Take 15 mg by mouth at bedtime. ?Patient not taking: Reported on 10/01/2021 12/17/20   [provider]  ?omeprazole (PRILOSEC) 20 MG capsule Take 20 mg by mouth daily. ?Patient not taking: Reported on 10/01/2021    [provider]  ?sennosides-docusate sodium (SENOKOT-S) 8.6-50 MG tablet Take 2 tablets by mouth daily. ?Patient not taking: Reported on 10/01/2021    [provider]  ? ? ?Physical Exam: ?Vitals:  ? 10/02/21 0000 10/02/21 0035 10/02/21 0132 10/02/21 0135  ?BP: 115/87 104/73 119/83   ?Pulse: 62 68 63 (!) 59  ?Resp: 16 (!) 23 12 16   ?Temp:       ?TempSrc:      ?SpO2: 93% 94% 96% 96%  ? ?Physical Exam ?Vitals and nursing note reviewed.  ?Constitutional:   ?   General: He is not in acute distress. ?   Comments: Somnolent from meds given in the ED  ?HENT:  ?   Head: Normocephalic and atraumatic.  ?Cardiovascular:  ?   Rate and Rhythm: Normal rate and regular rhythm.  ?   Pulses: Normal pulses.  ?   Heart sounds: Normal heart sounds.  ?Pulmonary:  ?   Effort: Pulmonary effort is normal.  ?   Breath sounds: Normal breath sounds.  ?Abdominal:  ?   Palpations: Abdomen is soft.  ?   Tenderness: There is no abdominal tenderness.  ? ? ? ?Data Reviewed: ?Relevant notes from primary care and specialist visits, past discharge summaries as available in EHR, including Care Everywhere. ?Prior diagnostic testing as pertinent to current admission diagnoses ?Updated medications and problem lists for reconciliation ?ED course, including vitals, labs, imaging, treatment  and response to treatment ?Triage notes, nursing and pharmacy notes and ED provider's notes ?Notable results as noted in HPI ? ? ?Assessment and Plan: ?Acute respiratory failure with hypoxia ([place]) ?Intermittently hypoxic to 88.  Was 86% with EMS and with no baseline O2 requirement.  Also tachypneic ?Supplemental O2 to maintain sats over 92 and wean as tolerated ? ?Acute metabolic encephalopathy ?Patient was initially unresponsive, became agitated after administration of Narcan, requiring Haldol and Ativan in the ED ?Initial unresponsiveness suspect related to narcotics administered for treatment of postextraction dental pain ?Neurologic checks with fall and aspiration precautions ?We will keep n.p.o. for now until back to baseline ?IV hydration ? ?Sinus bradycardia ?Will hold home Toprol ?Continuous cardiac monitoring ? ?Uncontrolled type 2 diabetes mellitus with hyperglycemia, without long-term current use of insulin ([place]) ?Sliding scale insulin coverage ? ?AKI (acute kidney injury) ([place]) ?IV hydration,  monitor renal function and avoid nephrotoxins ? ?Sleep apnea ?CPAP if more alert and tolerating ? ?Prolonged QT interval ?Avoid QT prolonging drugs.  Patient had discontinuation of galantamine and Seroque

## 2021-10-02 NOTE — Assessment & Plan Note (Addendum)
-   A1c 5.9% on 09/21/21 ?- okay with diet control  ?

## 2021-10-02 NOTE — Hospital Course (Addendum)
Stanley Nielsen is an 81 yo male with PMH CKD3, dementia, depression, HTN, sleep apnea, DMII who presented to the hospital after becoming unresponsive.  He was recently hospitalized earlier in April after a syncopal episode and had medications adjusted at that time. ?It was reported that he had several teeth removed prior to admission with having received pain medication after the procedure. ?On arrival to the ER he was noted to be still confused, agitated, and combative.  He was given a dose of Haldol and Ativan. ?His symptoms were felt due to delirium possibly from recent dental surgery and pain medication use. He rapidly improved with monitoring in the hospital and returned back to his normal baseline prior to discharge.  ?

## 2021-10-02 NOTE — TOC Progression Note (Addendum)
Transition of Care (TOC) - Progression Note  ? ? ?Patient Details  ?Name: Isom Kochan Beynon ?MRN: [id number] ?Date of Birth: 07-27-1940 ? ?Transition of Care (TOC) CM/SW Contact  ?Marlowe Sax, RN ?Phone Number: ?10/02/2021, 4:00 PM ? ?Clinical Narrative:    ?I called and spoke to Amy, Gave her my contact information, I explained the patient is medically ready to return tomorrow he is under Observation and does not meet criteria for Inpatient ?  ?  ? ?Expected Discharge Plan and Services ?  ?  ?  ?  ?  ?                ?  ?  ?  ?  ?  ?  ?  ?  ?  ?  ? ? ?Social Determinants of Health (SDOH) Interventions ?  ? ?Readmission Risk Interventions ?   ? View : No data to display.  ?  ?  ?  ? ? ?

## 2021-10-02 NOTE — Assessment & Plan Note (Signed)
Avoid QT prolonging drugs.  Patient had discontinuation of galantamine and Seroquel during recent hospitalization from 4/2 to 09/22/2021 ?

## 2021-10-02 NOTE — Plan of Care (Incomplete)

## 2021-10-02 NOTE — Assessment & Plan Note (Addendum)
-   continue toprol  

## 2021-10-03 DIAGNOSIS — G9341 Metabolic encephalopathy: Secondary | ICD-10-CM | POA: Diagnosis not present

## 2021-10-03 DIAGNOSIS — F02C 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, severe, without behavioral disturbance, psychotic disturbance, mood disturbance, and anxiety: Secondary | ICD-10-CM

## 2021-10-03 DIAGNOSIS — N179 Acute kidney failure, unspecified: Secondary | ICD-10-CM | POA: Diagnosis not present

## 2021-10-03 DIAGNOSIS — J9601 Acute respiratory failure with hypoxia: Secondary | ICD-10-CM | POA: Diagnosis not present

## 2021-10-03 DIAGNOSIS — G301 Alzheimer's disease with late onset: Secondary | ICD-10-CM

## 2021-10-03 LAB — BASIC METABOLIC PANEL
Anion gap: 9 (ref 5–15)
BUN: 18 mg/dL (ref 8–23)
CO2: 22 mmol/L (ref 22–32)
Calcium: 8.7 mg/dL — ABNORMAL LOW (ref 8.9–10.3)
Chloride: 110 mmol/L (ref 98–111)
Creatinine, Ser: 1.11 mg/dL (ref 0.61–1.24)
GFR, Estimated: >60 mL/min (ref >60)
Glucose, Bld: 82 mg/dL (ref 70–99)
Potassium: 3.6 mmol/L (ref 3.5–5.1)
Sodium: 141 mmol/L (ref 135–145)

## 2021-10-03 LAB — CBC WITH DIFFERENTIAL/PLATELET
Abs Immature Granulocytes: 0.01 10*3/uL (ref 0.00–0.07)
Basophils Absolute: 0.1 10*3/uL (ref 0.0–0.1)
Basophils Relative: 1 %
Eosinophils Absolute: 0.3 10*3/uL (ref 0.0–0.5)
Eosinophils Relative: 4 %
HCT: 40.2 % (ref 39.0–52.0)
Hemoglobin: 13.2 g/dL (ref 13.0–17.0)
Immature Granulocytes: 0 %
Lymphocytes Relative: 32 %
Lymphs Abs: 2 10*3/uL (ref 0.7–4.0)
MCH: 28.8 pg (ref 26.0–34.0)
MCHC: 32.8 g/dL (ref 30.0–36.0)
MCV: 87.6 fL (ref 80.0–100.0)
Monocytes Absolute: 0.5 10*3/uL (ref 0.1–1.0)
Monocytes Relative: 7 %
Neutro Abs: 3.6 10*3/uL (ref 1.7–7.7)
Neutrophils Relative %: 56 %
Platelets: 172 10*3/uL (ref 150–400)
RBC: 4.59 MIL/uL (ref 4.22–5.81)
RDW: 14.4 % (ref 11.5–15.5)
WBC: 6.4 10*3/uL (ref 4.0–10.5)
nRBC: 0 % (ref 0.0–0.2)

## 2021-10-03 LAB — FOLATE RBC
Folate, Hemolysate: 559 ng/mL
Folate, RBC: 1408 ng/mL (ref >498)
Hematocrit: 39.7 % (ref 37.5–51.0)

## 2021-10-03 LAB — MAGNESIUM: Magnesium: 2.1 mg/dL (ref 1.7–2.4)

## 2021-10-03 LAB — GLUCOSE, CAPILLARY
Glucose-Capillary: 74 mg/dL (ref 70–99)
Glucose-Capillary: 66 mg/dL — ABNORMAL LOW (ref 70–99)
Glucose-Capillary: 112 mg/dL — ABNORMAL HIGH (ref 70–99)
Glucose-Capillary: 93 mg/dL (ref 70–99)
Glucose-Capillary: 84 mg/dL (ref 70–99)

## 2021-10-03 MED ORDER — DEXTROSE 50 % IV SOLN
12.5000 g | Freq: Once | INTRAVENOUS | Status: AC
  Administered 2021-10-03: 12.5 g via INTRAVENOUS
  Filled 2021-10-03: qty 50

## 2021-10-03 MED ORDER — DEXTROSE-NACL 5-0.9 % IV SOLN: INTRAVENOUS | Status: DC

## 2021-10-03 NOTE — Progress Notes (Signed)
Cross Cover ?Dextrose containing continuous iv fluids started due to npo status and low normal blood sugars ? ?

## 2021-10-03 NOTE — Progress Notes (Signed)
SLP Cancellation Note ? ?Patient Details ?Name: Stanley Nielsen ?MRN: [id number] ?DOB: February 09, 1941 ? ? ?Cancelled treatment:       Reason Eval/Treat Not Completed: SLP screened, no needs identified, will sign off (chart reviewed; NSG consulted. Attempted to see pt prior to the Lunch meal, however, BSE order was canceled by MD prior to arrival to floor/room.) ?ST BSE order is canceled per chart. ?Per chart review and MD note/Discharge Summary, pt has a Baseline of Dementia. At this admit to the hospital, he "had several teeth removed prior to admission with having received pain medication after the procedure. On arrival to the ER he was noted to be still confused, agitated, and combative.  He was given a dose of Haldol and Ativan. His symptoms were felt due to delirium possibly from recent dental surgery and pain medication use. He rapidly improved with monitoring in the hospital and returned back to his normal baseline prior to discharge.".  ?MD has placed a Discharge order(pt to return to his facility setting) and an order for a SOFT diet; this is a diet that is GI in nature -- it will not provide "soft" foods that are easy to mash/chew necessarily. ?Consulted NSG and discussed pt's status giving general recommendations including: the diet consistency(support in choosing foods that are easily mashed/broken down when missing Dentition while here and at his facility); possible drink supplement such as Ensure for support; pills swallowed in a puree for safer swallowing in setting of Dementia and recent illness. Also recommended monitoring at meals and f/u w/ ST services at his facility if any concerns noted when he returns today -- NSG stated EMS had been called and that she was now calling to give report.  ?ST services can be available if needs while admitted. NSG agreed.  ? ? ? ? ? ?Jerilynn Som, MS, CCC-SLP ?Speech Language Pathologist ?Rehab Services; [facility] [place] ?[phone]  (ascom) ?Chanya Chrisley ?10/03/2021, 11:25 AM ?

## 2021-10-03 NOTE — Plan of Care (Signed)

## 2021-10-03 NOTE — TOC Progression Note (Addendum)
Transition of Care (TOC) - Progression Note  ? ? ?Patient Details  ?Name: Stanley Nielsen ?MRN: [id number] ?Date of Birth: 21-Oct-1940 ? ?Transition of Care (TOC) CM/SW Contact  ?Marlowe Sax, RN ?Phone Number: ?10/03/2021, 9:30 AM ? ?Clinical Narrative:    ?Unable to rea ?Called Aura Camps at [employer] her mailbox is not set up and there is no answer ?Called the main number and was transferred to the memory care unit at [address] mailbox is full, I called back and asked the receptionist to ask someone to answer the phone, I explained the patient is medically stable and ready to return, he has no been made inpatient ?He is observation status here at the hospital, spoke with Joyce Gross in memory care explained the patient will be returning today he is medically stable, Joyce Gross stated the he will be fine to come back today without a fl2 ?Unable to reach the daughter in [place] ?  ? Called EMS to transport, he is 5th on list ? ?Expected Discharge Plan and Services ?  ?  ?  ?  ?  ?                ?  ?  ?  ?  ?  ?  ?  ?  ?  ?  ? ? ?Social Determinants of Health (SDOH) Interventions ?  ? ?Readmission Risk Interventions ?   ? View : No data to display.  ?  ?  ?  ? ? ?

## 2021-10-03 NOTE — Progress Notes (Signed)
Ems was here to transport, the brother drove patient to facility by car instead. Patient was taken to car by EMS, no distress when leaving the floor. ?

## 2021-10-03 NOTE — Discharge Summary (Signed)
?Physician Discharge Summary ?  ?Patient: Stanley Nielsen MRN: [id number] DOB: [birth year]  ?Admit date:     10/01/2021  ?Discharge date: 10/03/21  ?Discharge Physician: Lewie Chamber  ? ?PCP: Martie Round, NP  ? ?Recommendations at discharge:  ? ? Continue routine care ?Might need dose reduction of Toprol in the future ? ?Discharge Diagnoses: ?Active Problems: ?  DMII (diabetes mellitus, type 2) (HCC) ?  Sinus bradycardia ?  Sleep apnea ?  Prolonged QT interval ?  Dementia (HCC) ?  Hypertension ? ?Principal Problem (Resolved): ?  Acute metabolic encephalopathy ?Resolved Problems: ?  Acute respiratory failure with hypoxia (HCC) ?  AKI (acute kidney injury) (HCC) ? ?Hospital Course: ?[name] is an 81 yo male with PMH CKD3, dementia, depression, HTN, sleep apnea, DMII who presented to the hospital after becoming unresponsive.  He was recently hospitalized earlier in April after a syncopal episode and had medications adjusted at that time. ?It was reported that he had several teeth removed prior to admission with having received pain medication after the procedure. ?On arrival to the ER he was noted to be still confused, agitated, and combative.  He was given a dose of Haldol and Ativan. ?His symptoms were felt due to delirium possibly from recent dental surgery and pain medication use. He rapidly improved with monitoring in the hospital and returned back to his normal baseline prior to discharge.  ? ?Assessment and Plan: ?* Acute metabolic encephalopathy-resolved as of 10/03/2021 ?- Patient was initially unresponsive, became agitated after administration of Narcan, requiring Haldol and Ativan in the ED ?- Initial unresponsiveness suspected related to narcotics administered for treatment of postextraction dental pain; some probable delirium component too ?- rapid improvement and stable recovery; mentation has returned back to baseline.  ? ?Acute respiratory failure with hypoxia (HCC)-resolved as of  10/03/2021 ?Intermittently hypoxic to 88.  Was 86% with EMS and with no baseline weaned off prior to discharge  ? ?Sinus bradycardia ?- Will hold home Toprol initially ?- improved and remained asymptomatic  ?- okay to resume toprol at discharge ? ?DMII (diabetes mellitus, type 2) (HCC) ?- A1c 5.9% on 09/21/21 ?- okay with diet control  ? ?AKI (acute kidney injury) (HCC)-resolved as of 10/03/2021 ?- baseline creatinine ~ 1 ?- patient presents with increase in creat >0.3 mg/dL above baseline, creat increase >1.5x baseline presumed to have occurred within past 7 days PTA ?-Presumed volume depletion/prerenal ?- Creatinine 1.57 on admission.   ?- resolved with IVF ? ? ?Prolonged QT interval ?Avoid QT prolonging drugs.  Patient had discontinuation of galantamine and Seroquel during recent hospitalization from 4/2 to 09/22/2021 ? ?Dementia (HCC) ?Delirium precautions ? ?Hypertension ?- continue toprol  ? ? ? ? ?  ? ?Consultants:  ?Procedures performed:   ?Disposition:  [facility] ?Diet recommendation:  ?Discharge Diet Orders (From admission, onward)  ? ?  Start     Ordered  ? 10/03/21 0000  Diet - low sodium heart healthy       ? 10/03/21 1100  ? ?  ?  ? ?  ? ?Cardiac diet ?DISCHARGE MEDICATION: ?Allergies as of 10/03/2021   ? ?   Reactions  ? Penicillins Hives  ? Has patient had a PCN reaction causing immediate rash, facial/tongue/throat swelling, SOB or lightheadedness with hypotension: No ?Has patient had a PCN reaction causing severe rash involving mucus membranes or skin necrosis: No ?Has patient had a PCN reaction that required hospitalization: No ?Has patient had a PCN reaction occurring within the last 10  years: No ?If all of the above answers are "NO", then may proceed with Cephalosporin use.  ? Sulfa Antibiotics Nausea And Vomiting  ? ?  ? ?  ?Medication List  ?  ? ?STOP taking these medications   ? ?aspirin EC 81 MG tablet ?  ?cefdinir 300 MG capsule ?Commonly known as: OMNICEF ?  ?clotrimazole 1 % cream ?Commonly  known as: LOTRIMIN ?  ?divalproex 250 MG DR tablet ?Commonly known as: DEPAKOTE ?  ?LORazepam 0.5 MG tablet ?Commonly known as: ATIVAN ?  ?melatonin 3 MG Tabs tablet ?  ?metoprolol succinate 25 MG 24 hr tablet ?Commonly known as: TOPROL-XL ?  ?mirtazapine 15 MG tablet ?Commonly known as: REMERON ?  ?omeprazole 20 MG capsule ?Commonly known as: PRILOSEC ?  ? ?  ? ?TAKE these medications   ? ?hydrALAZINE 10 MG tablet ?Commonly known as: APRESOLINE ?Take 10 mg by mouth 2 (two) times daily as needed. ?  ?MiraLax 17 GM/SCOOP powder ?Generic drug: polyethylene glycol powder ?Take 17 g by mouth daily as needed for mild constipation. ?  ?OLANZapine 15 MG tablet ?Commonly known as: ZYPREXA ?Take 1 tablet (15 mg total) by mouth at bedtime. ?  ?oxymetazoline 0.05 % nasal spray ?Commonly known as: AFRIN ?Place 1 spray into both nostrils 2 (two) times daily as needed for congestion. ?  ?sennosides-docusate sodium 8.6-50 MG tablet ?Commonly known as: SENOKOT-S ?Take 2 tablets by mouth daily. ?  ?tamsulosin 0.4 MG Caps capsule ?Commonly known as: FLOMAX ?Take 0.4 mg by mouth daily. ?  ? ?  ? ? Contact information for follow-up providers   ? ? Martie RoundSpencer, Nicole, NP.   ?Specialty: Nurse Practitioner ?Contact information: ?[address] ?[place] [address] ?[phone] ? ? ?  ?  ? ? Antonieta IbaGollan, Timothy J, MD.   ?Specialty: Cardiology ?Contact information: ?[address] ?[address] ?[place] [address] ?[phone] ? ? ?  ?  ? ?  ?  ? ? Contact information for after-discharge care   ? ? Destination   ? ? [facility] .   ?Service: Assisted Living ?Contact information: ?[address] ?[place] [id number] ?[phone] ? ?  ?  ? ?  ?  ? ?  ?  ? ?  ? ?Discharge Exam: ?Vitals:  ? 10/03/21 0315 10/03/21 0727  ?BP: (!) 143/86 (!) 157/89  ?Pulse: 70 61  ?Resp: 20 18  ?Temp: 98.2 ?F (36.8 ?C) 97.8 ?F (36.6 ?C)  ?SpO2: 100% 100%  ? Physical Exam ?Constitutional:   ?   Comments: Awake, alert, NAD. Dementia  appreciated. Follows all commands easily. Speech normal  ?HENT:  ?   Head: Normocephalic and atraumatic.  ?   Mouth/Throat:  ?   Mouth: Mucous membranes are moist.  ?   Comments: Teeth extractions appreciated ?Eyes:  ?   Extraocular Movements: Extraocular movements intact.  ?Cardiovascular:  ?   Rate and Rhythm: Normal rate and regular rhythm.  ?   Heart sounds: Normal heart sounds.  ?Pulmonary:  ?   Effort: Pulmonary effort is normal. No respiratory distress.  ?   Breath sounds: Normal breath sounds. No wheezing.  ?Abdominal:  ?   General: Bowel sounds are normal. There is no distension.  ?   Palpations: Abdomen is soft.  ?   Tenderness: There is no abdominal tenderness.  ?Musculoskeletal:     ?   General: Normal range of motion.  ?   Cervical back: Normal range of motion and neck supple.  ?  Skin: ?   General: Skin is warm and dry.  ?Neurological:  ?   Comments: Follows all commands easily; dementia appreciated. Moves all 4 extremities well   ?  ? ? ?Condition at discharge: stable ? ?The results of significant diagnostics from this hospitalization (including imaging, microbiology, ancillary and laboratory) are listed below for reference.  ? ?Imaging Studies: ?X-ray chest PA and lateral ? ?Result Date: 09/21/2021 ?CLINICAL DATA:  81 year old male with syncope and fall out of chair. EXAM: CHEST - 2 VIEW COMPARISON:  Chest radiographs 06/07/2021 and earlier. FINDINGS: AP and lateral views of the chest at 0457 hours. Lung volumes and mediastinal contours within normal limits, stable from last year. Visualized tracheal air column is within normal limits. Both lungs appear stable and clear. No pneumothorax or pleural effusion. Osteopenia. No acute osseous abnormality identified. Negative visible bowel gas. IMPRESSION: No acute cardiopulmonary abnormality or acute traumatic injury identified. Electronically Signed   By: Odessa Fleming M.D.   On: 09/21/2021 05:56  ? ?CT Head Wo Contrast ? ?Result Date: 10/01/2021 ?CLINICAL DATA:   Mental status change, unknown cause EXAM: CT HEAD WITHOUT CONTRAST TECHNIQUE: Contiguous axial images were obtained from the base of the skull through the vertex without intravenous contrast. RADIATION DOSE REDUCTIO

## 2021-10-03 NOTE — Progress Notes (Signed)
Called [facility] 4 times,each time the call somehow became disconnected in the middle of given report (Stanley Nielsen) Will send paperwork with patient. ?

## 2021-10-20 ENCOUNTER — Telehealth: Payer: Self-pay

## 2021-10-20 NOTE — Telephone Encounter (Signed)
Received referral attempted to schedule. Lmov to call office.  ?

## 2021-10-27 NOTE — Telephone Encounter (Signed)
Unable to reach via contacts cell is incorrect and home number is [employer] recording.  Attempted to contact via emergency number that is in [place] and no ans no vm.  ? ?Mailing letter.  Closing referral.  ?

## 2021-10-29 NOTE — Progress Notes (Signed)
? ? ?Primary Care Provider: Martie Round, NP ?[facility] Cardiologist: None ?Electrophysiologist: None ? ?Clinic Note: ?Chief Complaint  ?Patient presents with  ? NEW patient-syncope  ? ?=================================== ? ?ASSESSMENT/PLAN  ? ?Problem List Items Addressed This Visit   ? ?  ? Cardiology Problems  ? Essential hypertension (Chronic)  ?  Reviewing his med list, it is very difficult to understand what medicines he is supposed to be on.  He had been on Toprol but that was stopped, he had been on amlodipine off-and-on but is no longer currently being given.  Lisinopril has been on and off list and supposedly is currently being administered with the last dose being administered on the day of this visit.  However it is not listed on his med list here. ?He is somewhat hypertensive here today, but is blood pressures reported from [facility] have been relatively normal. ? ?In an 81 year old gentleman with history of syncope my recommendation would be to allow for permissive hypertension and tolerate blood pressures as high as 160s.  He is on very low-dose hydralazine 10 mg, and he could could easily be given an additional dose as needed for systolic pressures greater than 160 mmHg.. ? ?  ?  ? Ischemic stroke (HCC) (Chronic)  ?  Not currently on aspirin although it has been on his list.  Not taking.  Will defer to primary team. ? ?  ?  ? Relevant Orders  ? EKG 12-Lead  ? VAS US CAROTID  ? Sinus bradycardia (Chronic)  ?  Had previously been on Toprol.  Held during his hospitalization in April and supposedly restarted, but is not currently on his med list.  Not currently bradycardic. ? ?Plan: We will try to have him wear to back to back 14-day Zio patch monitor in an attempt to capture arrhythmia or bradycardia-potentially associated with syncope. ? ?  ?  ? Relevant Orders  ? EKG 12-Lead  ? LONG TERM MONITOR (3-14 DAYS)  ?  ? Other  ? Syncope - Primary  ?  I 100% agree with Dr. Renae Gloss from his last  hospitalization.  Not a very good historian.  Very difficult to understand what happened at the time of his episodes. ?He is not orthostatic here nor was he orthostatic in the hospital.  There was concern about prolonged QT interval on EKGs during his hospitalizations and medications was discontinued including galantamine and Seroquel.  He is now only on Zyprexa. ?Toprol was started in the hospital, but no longer on board.  Apparently he is on lisinopril. ? ?He has had 2 echocardiograms done in the last 6 months both of which are normal and have no structural abnormalities to explain syncope. ? ?To complete cardiovascular evaluation for syncope, the remaining studies included carotid Dopplers (albeit really more to see vertebral artery disease) along with an event monitor (we will do back-to-back 14-day Zio patch monitors in an attempt to capture an arrhythmia or bradycardia). ? ?Plan: 14-day Zio patch monitor x2, carotid Dopplers ?Avoid dehydration, and would not overtreat blood pressure-allowing for permissive hypertension. => Suggest use of as needed hydralazine 10 mg for SBP greater than 160 (in addition to his standing dose) ?Would be leery of titrating ACE inhibitor if he is indeed on it. ?Unless we see an arrhythmia, probably would not use beta-blocker. ? ?I agree with the holding of potential QT prolonging gating agents although I do not think that he would had syncope from VT and not had some other symptoms. ? ?  ?  ?  Relevant Orders  ? EKG 12-Lead  ? LONG TERM MONITOR (3-14 DAYS)  ? VAS US CAROTID  ? ? ?=================================== ? ?HPI:   ? ?Stanley Nielsen is a 81 y.o. male with PMH notable for HTN, HLD, DM-2, and prior CVA, dementia (with depression, and behavioral disturbances), OSA-CPAP who is being seen today for the evaluation of SYNCOPE and BRADYCARDIA at the request of Martie Round, NP. ? ?Stanley Nielsen was referred to cardiology based on frequent episodes of altered  mental status/syncope/bradycardia. ? ?Recent Hospitalizations: [facility] ?12/17-19/2022: Admitted with concerns of syncope.  Ruled out for stroke.  Neurology ruled out seizure clinically.  Unfortunately orthostatics not checked on admission => no clear etiology determined.  Normal echo and no arrhythmia noted. ?4/1-08/2021: Admitted again with "syncope ".  Per family report that he was in the chair and likely fell getting up.  EKG showed prolonged QT related to discontinuing galantamine and Seroquel.  Low-dose Toprol started.  As needed Zyprexa at night along with considering discontinue Remeron.  Echocardiogram again checked and normal.  CT scan of the head showed no acute abnormalities. ?4/12-14/2023: Admitted with an episode of unresponsiveness.  Apparently he had received pain medications after multiple teeth removed.  He was agitated and confused in the ER and given Haldol and Ativan.  Developed delirium with sedation and pain medications. => Noted to be bradycardic and therefore Toprol was held temporarily discontinued on discharge.  Was clearly dehydrated on admission as he had AKI with creatinine increased to 1.5. ? ?Reviewed  CV studies:   ? ?The following studies were reviewed today: (if available, images/films reviewed: From Epic Chart or Care Everywhere) ?TTE 06/08/2021: EF 60 to 65%.  No RWMA.  Normal valves. ?TTE 09/21/2021: EF 60 to 65%.  Moderate LVH.  GR 1 DD.  Mild LA dilation.  Normal RV/RVP and RAP.  Aortic sclerosis without stenosis. ? ? ?Interval History:  ? ?Stanley Nielsen is a very pleasant 81 year old gentleman who presents here by himself without anyone to help out with providing history.  When asked about the year, he said it was 2019.  He said he was from [place] and then changed and said he was from somewhere in [place] something.  He really is not a good historian and is very difficult to ascertain any true symptoms.  He tends to moderate and is very unclear. ?He tells me has had these  episodes were was told that he had passed out.  He is not able to tell if he can remember anything associated with these episodes.  He said that he can tell he was in a pass out or collapse but was unable to actually say what he felt.  Asked if he felt irregular heartbeats he said maybe.  He did feel very flushed and little bit nauseated. ?He thinks at least one of the episodes was more of a mechanical fall.  He had a spell a week or so ago where he had a fall and scraped up his elbow.  He said when that happened there was that his knees gave out on him.  Another time he was going up steps and missed a step falling down into the steps. ? ?Otherwise, no major cardiac symptoms.  He denies any chest pain or pressure with rest or exertion.  No PND, orthopnea or edema.  Nothing really do suspect that he feels any irregular heartbeats or palpitations.  He does not mention symptoms of dizziness standing up. ? ?CV Review of  Symptoms (Summary) ?Cardiovascular ROS: positive for - loss of consciousness and maybe a little bit of prodrome symptoms but not really sure besides some flushing and nausea.  Does not comment on palpitations ?negative for - chest pain, dyspnea on exertion, edema, irregular heartbeat, orthopnea, palpitations, paroxysmal nocturnal dyspnea, rapid heart rate, shortness of breath, or TIA or hours of BX, claudication ? ?REVIEWED OF SYSTEMS  ? ?Very difficult to obtain ?Review of Systems  ?Constitutional:  Negative for malaise/fatigue and weight loss.  ?HENT:  Negative for nosebleeds.   ?Respiratory: Negative.    ?Cardiovascular:   ?     Per HPI  ?Gastrointestinal:  Positive for blood in stool (Has had blood when he wipes a couple times but nothing prominent.). Negative for melena.  ?Genitourinary:  Negative for flank pain and hematuria.  ?Musculoskeletal:  Positive for back pain and joint pain.  ?     Random arthritis pains  ?Neurological:  Negative for sensory change, speech change, focal weakness, weakness  and headaches.  ?Psychiatric/Behavioral:  Positive for memory loss. Negative for depression. The patient is not nervous/anxious.   ? ?I have reviewed and (if needed) personally updated the patient's proble

## 2021-10-30 ENCOUNTER — Ambulatory Visit (INDEPENDENT_AMBULATORY_CARE_PROVIDER_SITE_OTHER): Payer: No Typology Code available for payment source

## 2021-10-30 ENCOUNTER — Encounter: Payer: Self-pay | Admitting: Cardiology

## 2021-10-30 ENCOUNTER — Ambulatory Visit (INDEPENDENT_AMBULATORY_CARE_PROVIDER_SITE_OTHER): Payer: Medicaid Other | Admitting: Cardiology

## 2021-10-30 VITALS — BP 153/79 | HR 69 | Ht 69.0 in | Wt 180.0 lb

## 2021-10-30 DIAGNOSIS — R55 Syncope and collapse: Secondary | ICD-10-CM

## 2021-10-30 DIAGNOSIS — R001 Bradycardia, unspecified: Secondary | ICD-10-CM

## 2021-10-30 DIAGNOSIS — I10 Essential (primary) hypertension: Secondary | ICD-10-CM | POA: Diagnosis not present

## 2021-10-30 DIAGNOSIS — I639 Cerebral infarction, unspecified: Secondary | ICD-10-CM

## 2021-10-30 NOTE — Patient Instructions (Signed)
Medication Instructions:  ?- Your physician recommends that you continue on your current medications as directed. Please refer to the Current Medication list given to you today. ? ?*If you need a refill on your cardiac medications before your next appointment, please call your pharmacy* ? ? ?Lab Work: ?- none ordered ? ?If you have labs (blood work) drawn today and your tests are completely normal, you will receive your results only by: ?MyChart Message (if you have MyChart) OR ?A paper copy in the mail ?If you have any lab test that is abnormal or we need to change your treatment, we will call you to review the results. ? ? ?Testing/Procedures: ? ?1) Carotid ultrasound:  ?- Your physician has requested that you have a carotid duplex. This test is an ultrasound of the carotid arteries in your neck. It looks at blood flow through these arteries that supply the brain with blood. Allow one hour for this exam. There are no restrictions or special instructions. ? ? ?2) Heart Monitor: ? ?You will need to wear 2 - 14 day heart monitors ?This will be mailed to your mailing address on file within 3-5 business days ?The 1st monitor will be worn for 14 days, then you will need to mail it back to the company in the box provided when you receive the monitor ?The 2nd monitor will be shipped to you about the time you are to take the 1st monitor off ?PLEASE NOTIFY THE OFFICE AT [phone], IF YOU DO NOT RECEIVE THE 1ST MONITOR AFTER 5 BUSINESS DAYS ? ?- Your physician has recommended that you wear a Zio monitor.  ? ?This monitor is a medical device that records the heart?s electrical activity. Doctors most often use these monitors to diagnose arrhythmias. Arrhythmias are problems with the speed or rhythm of the heartbeat. The monitor is a small device applied to your chest. You can wear one while you do your normal daily activities. While wearing this monitor if you have any symptoms to push the button and record what you felt.  Once you have worn this monitor for the period of time provider prescribed (Usually 14 days), you will return the monitor device in the postage paid box. Once it is returned they will download the data collected and provide [place] with a report which the provider will then review and we will call you with those results. Important tips: ? ?Avoid showering during the first 24 hours of wearing the monitor. ?Avoid excessive sweating to help maximize wear time. ?Do not submerge the device, no hot tubs, and no swimming pools. ?Keep any lotions or oils away from the patch. ?After 24 hours you may shower with the patch on. Take brief showers with your back facing the shower head.  ?Do not remove patch once it has been placed because that will interrupt data and decrease adhesive wear time. ?Push the button when you have any symptoms and write down what you were feeling. ?Once you have completed wearing your monitor, remove and place into box which has postage paid and place in your outgoing mailbox.  ?If for some reason you have misplaced your box then call our office and we can provide another box and/or mail it off for you. ? ? ?Follow-Up: ?At [facility], you and your health needs are our priority.  As part of our continuing mission to provide you with exceptional heart care, we have created designated Provider Care Teams.  These Care Teams include your primary Cardiologist (physician)  and Advanced Practice Providers (APPs -  Physician Assistants and Nurse Practitioners) who all work together to provide you with the care you need, when you need it. ? ?We recommend signing up for the patient portal called "MyChart".  Sign up information is provided on this After Visit Summary.  MyChart is used to connect with patients for Virtual Visits (Telemedicine).  Patients are able to view lab/test results, encounter notes, upcoming appointments, etc.  Non-urgent messages can be sent to your provider as well.   ?To learn more about  what you can do with MyChart, go to [email or web address].   ? ?Your next appointment:   ?2 month(s) ? ?The format for your next appointment:   ?In Person ? ?Provider:   ?Bryan Lemma, MD  ? ? ?Other Instructions ?N/A ? ?Important Information About Sugar ? ? ? ? ? ? ?

## 2021-11-01 ENCOUNTER — Encounter: Payer: Self-pay | Admitting: Cardiology

## 2021-11-01 NOTE — Assessment & Plan Note (Signed)
Reviewing his med list, it is very difficult to understand what medicines he is supposed to be on.  He had been on Toprol but that was stopped, he had been on amlodipine off-and-on but is no longer currently being given.  Lisinopril has been on and off list and supposedly is currently being administered with the last dose being administered on the day of this visit.  However it is not listed on his med list here. ?He is somewhat hypertensive here today, but is blood pressures reported from [facility] have been relatively normal. ? ?In an 81 year old gentleman with history of syncope my recommendation would be to allow for permissive hypertension and tolerate blood pressures as high as 160s.  He is on very low-dose hydralazine 10 mg, and he could could easily be given an additional dose as needed for systolic pressures greater than 160 mmHg.Marland Kitchen ?

## 2021-11-01 NOTE — Assessment & Plan Note (Signed)
Not currently on aspirin although it has been on his list.  Not taking.  Will defer to primary team. ?

## 2021-11-01 NOTE — Assessment & Plan Note (Signed)
I 100% agree with Dr. Renae Gloss from his last hospitalization.  Not a very good historian.  Very difficult to understand what happened at the time of his episodes. ?He is not orthostatic here nor was he orthostatic in the hospital.  There was concern about prolonged QT interval on EKGs during his hospitalizations and medications was discontinued including galantamine and Seroquel.  He is now only on Zyprexa. ?Toprol was started in the hospital, but no longer on board.  Apparently he is on lisinopril. ? ?He has had 2 echocardiograms done in the last 6 months both of which are normal and have no structural abnormalities to explain syncope. ? ?To complete cardiovascular evaluation for syncope, the remaining studies included carotid Dopplers (albeit really more to see vertebral artery disease) along with an event monitor (we will do back-to-back 14-day Zio patch monitors in an attempt to capture an arrhythmia or bradycardia). ? ?Plan: 14-day Zio patch monitor x2, carotid Dopplers ?Avoid dehydration, and would not overtreat blood pressure-allowing for permissive hypertension. => Suggest use of as needed hydralazine 10 mg for SBP greater than 160 (in addition to his standing dose) ?Would be leery of titrating ACE inhibitor if he is indeed on it. ?Unless we see an arrhythmia, probably would not use beta-blocker. ? ?I agree with the holding of potential QT prolonging gating agents although I do not think that he would had syncope from VT and not had some other symptoms. ?

## 2021-11-01 NOTE — Assessment & Plan Note (Signed)
Had previously been on Toprol.  Held during his hospitalization in April and supposedly restarted, but is not currently on his med list.  Not currently bradycardic. ? ?Plan: We will try to have him wear to back to back 14-day Zio patch monitor in an attempt to capture arrhythmia or bradycardia-potentially associated with syncope. ?

## 2021-11-04 ENCOUNTER — Emergency Department
Admission: EM | Admit: 2021-11-04 | Discharge: 2021-11-04 | Disposition: A | Discharge status: Skilled Nursing Facility | Payer: No Typology Code available for payment source | Attending: Emergency Medicine | Admitting: Emergency Medicine

## 2021-11-04 ENCOUNTER — Other Ambulatory Visit: Payer: Self-pay

## 2021-11-04 ENCOUNTER — Emergency Department: Payer: No Typology Code available for payment source

## 2021-11-04 DIAGNOSIS — N390 Urinary tract infection, site not specified: Secondary | ICD-10-CM | POA: Insufficient documentation

## 2021-11-04 DIAGNOSIS — R001 Bradycardia, unspecified: Secondary | ICD-10-CM | POA: Diagnosis not present

## 2021-11-04 DIAGNOSIS — W19XXXA Unspecified fall, initial encounter: Secondary | ICD-10-CM | POA: Diagnosis not present

## 2021-11-04 DIAGNOSIS — N189 Chronic kidney disease, unspecified: Secondary | ICD-10-CM | POA: Insufficient documentation

## 2021-11-04 DIAGNOSIS — S0990XA Unspecified injury of head, initial encounter: Secondary | ICD-10-CM | POA: Diagnosis present

## 2021-11-04 DIAGNOSIS — R55 Syncope and collapse: Secondary | ICD-10-CM | POA: Diagnosis not present

## 2021-11-04 DIAGNOSIS — Y9301 Activity, walking, marching and hiking: Secondary | ICD-10-CM | POA: Insufficient documentation

## 2021-11-04 DIAGNOSIS — F0390 Unspecified dementia without behavioral disturbance: Secondary | ICD-10-CM | POA: Insufficient documentation

## 2021-11-04 DIAGNOSIS — I129 Hypertensive chronic kidney disease with stage 1 through stage 4 chronic kidney disease, or unspecified chronic kidney disease: Secondary | ICD-10-CM | POA: Diagnosis not present

## 2021-11-04 LAB — COMPREHENSIVE METABOLIC PANEL
ALT: 10 U/L (ref 0–44)
AST: 21 U/L (ref 15–41)
Albumin: 3.8 g/dL (ref 3.5–5.0)
Alkaline Phosphatase: 82 U/L (ref 38–126)
Anion gap: 6 (ref 5–15)
BUN: 20 mg/dL (ref 8–23)
CO2: 26 mmol/L (ref 22–32)
Calcium: 8.8 mg/dL — ABNORMAL LOW (ref 8.9–10.3)
Chloride: 109 mmol/L (ref 98–111)
Creatinine, Ser: 1.17 mg/dL (ref 0.61–1.24)
GFR, Estimated: >60 mL/min (ref >60)
Glucose, Bld: 102 mg/dL — ABNORMAL HIGH (ref 70–99)
Potassium: 3.9 mmol/L (ref 3.5–5.1)
Sodium: 141 mmol/L (ref 135–145)
Total Bilirubin: 1 mg/dL (ref 0.3–1.2)
Total Protein: 7.4 g/dL (ref 6.5–8.1)

## 2021-11-04 LAB — URINALYSIS, ROUTINE W REFLEX MICROSCOPIC
Bilirubin Urine: NEGATIVE
Glucose, UA: NEGATIVE mg/dL
Hgb urine dipstick: NEGATIVE
Ketones, ur: 5 mg/dL — AB
Nitrite: NEGATIVE
Protein, ur: NEGATIVE mg/dL
Specific Gravity, Urine: 1.009 (ref 1.005–1.030)
WBC, UA: >50 WBC/hpf — ABNORMAL HIGH (ref 0–5)
pH: 6 (ref 5.0–8.0)

## 2021-11-04 LAB — CBC
HCT: 36.9 % — ABNORMAL LOW (ref 39.0–52.0)
Hemoglobin: 11.9 g/dL — ABNORMAL LOW (ref 13.0–17.0)
MCH: 28.7 pg (ref 26.0–34.0)
MCHC: 32.2 g/dL (ref 30.0–36.0)
MCV: 89.1 fL (ref 80.0–100.0)
Platelets: 157 10*3/uL (ref 150–400)
RBC: 4.14 MIL/uL — ABNORMAL LOW (ref 4.22–5.81)
RDW: 14.2 % (ref 11.5–15.5)
WBC: 5.1 10*3/uL (ref 4.0–10.5)
nRBC: 0 % (ref 0.0–0.2)

## 2021-11-04 MED ORDER — SODIUM CHLORIDE 0.9 % IV SOLN
1.0000 g | Freq: Once | INTRAVENOUS | Status: AC
  Administered 2021-11-04: 1 g via INTRAVENOUS
  Filled 2021-11-04: qty 10

## 2021-11-04 MED ORDER — CEPHALEXIN 500 MG PO CAPS: 500.0000 mg | ORAL_CAPSULE | Freq: Two times a day (BID) | ORAL | 0 refills | Status: DC

## 2021-11-04 NOTE — ED Triage Notes (Signed)
Pt with a history of dementia was walking and witnessed fall with no LOC. ?

## 2021-11-04 NOTE — ED Notes (Signed)
Patient's relative- Stanley Nielsen- contacted using number listed. Stanley Nielsen unable to collect patient for safe discharge home. Patient returned to bed after being redirected multiple times. Patient with steady gait but wandering around room even with presence of bed alarm. Agricultural consultant contacted for Actuary. Emma EDT at bedside.  ?

## 2021-11-04 NOTE — ED Provider Notes (Signed)
? ?  [facility] ?Provider Note ? ? ? Event Date/Time  ? First MD Initiated Contact with Patient 11/04/21 [phone]   ?  (approximate) ? ?History  ? ?Chief Complaint: Fall ? ?HPI ? ?Stanley Nielsen is a 81 y.o. male with a past medical history of CKD, dementia, hypertension presents to the emergency department after a fall.  According to EMS report patient is coming from [place] where he had a fall today.  No LOC per report.  Patient does have an abrasion to the forehead and top of the head.  Patient has no other complaints currently.  No chest pain or abdominal pain no pain in his extremities. ? ?Physical Exam  ? ?Triage Vital Signs: ?ED Triage Vitals  ?Enc Vitals Group  ?   BP   ?   Pulse   ?   Resp   ?   Temp   ?   Temp src   ?   SpO2   ?   Weight   ?   Height   ?   Head Circumference   ?   Peak Flow   ?   Pain Score   ?   Pain Loc   ?   Pain Edu?   ?   Excl. in [place]?   ? ? ?Most recent vital signs: ?There were no vitals filed for this visit. ? ?General: Awake, no distress.  Small abrasion to the forehead and top of scalp ?CV:  Good peripheral perfusion.  Regular rate and rhythm  ?Resp:  Normal effort.  Equal breath sounds bilaterally.  ?Abd:  No distention.  Soft, nontender.  No rebound or guarding. ?Other:  Good range of motion all extremities no tenderness elicited. ? ? ?ED Results / Procedures / Treatments  ? ?RADIOLOGY ? ?I have personally reviewed the CT head images, no significant bleed seen on my evaluation. ?CT read as negative for acute abnormality besides anterior scalp hematoma. ? ? ?MEDICATIONS ORDERED IN ED: ?Medications - No data to display ? ? ?IMPRESSION / MDM / ASSESSMENT AND PLAN / ED COURSE  ?I reviewed the triage vital signs and the nursing notes. ? ?Patient presents emergency department after a witnessed fall at his nursing facility.  Patient has known dementia.  Cannot contribute much to his history.  Patient has no complaints however.  Great range of motion all  extremities including bilateral hips with no pain or tenderness elicited.  He does have a small abrasion to the forehead and top of his scalp.  No hematoma.  No C-spine tenderness.  We will obtain CT imaging of the head as a precaution we will check basic labs and urinalysis.  Patient agreeable to plan of care.  Differential would include mechanical fall, metabolic or electrolyte abnormality, intracranial hemorrhage. ? ?CT scan negative for acute abnormality. ?Lab work largely within normal limits besides a urinalysis consistent with urinary tract infection.  CBC is normal, CMP is normal.  Patient received 1 g of IV Rocephin in the emergency department.  I have sent a urine culture.  We will discharge on Keflex have the patient follow-up.  Patient agreeable. ? ?FINAL CLINICAL IMPRESSION(S) / ED DIAGNOSES  ? ?Fall ?Urinary tract infection ? ?Note:  This document was prepared using Dragon voice recognition software and may include unintentional dictation errors. ?  ?Harvest Dark, MD ?11/04/21 1102 ? ?

## 2021-11-05 LAB — URINE CULTURE

## 2021-11-08 ENCOUNTER — Other Ambulatory Visit: Payer: Self-pay

## 2021-11-08 ENCOUNTER — Emergency Department
Admission: EM | Admit: 2021-11-08 | Discharge: 2021-11-08 | Payer: No Typology Code available for payment source | Attending: Emergency Medicine | Admitting: Emergency Medicine

## 2021-11-08 DIAGNOSIS — F03911 Unspecified dementia, unspecified severity, with agitation: Secondary | ICD-10-CM | POA: Diagnosis not present

## 2021-11-08 DIAGNOSIS — N183 Chronic kidney disease, stage 3 unspecified: Secondary | ICD-10-CM | POA: Insufficient documentation

## 2021-11-08 DIAGNOSIS — I129 Hypertensive chronic kidney disease with stage 1 through stage 4 chronic kidney disease, or unspecified chronic kidney disease: Secondary | ICD-10-CM | POA: Diagnosis not present

## 2021-11-08 DIAGNOSIS — F03918 Unspecified dementia, unspecified severity, with other behavioral disturbance: Secondary | ICD-10-CM | POA: Diagnosis present

## 2021-11-08 DIAGNOSIS — F0390 Unspecified dementia without behavioral disturbance: Secondary | ICD-10-CM | POA: Diagnosis present

## 2021-11-08 DIAGNOSIS — F03B11 Unspecified dementia, moderate, with agitation: Secondary | ICD-10-CM

## 2021-11-08 DIAGNOSIS — R8289 Other abnormal findings on cytological and histological examination of urine: Secondary | ICD-10-CM | POA: Diagnosis not present

## 2021-11-08 LAB — CBC
HCT: 39.2 % (ref 39.0–52.0)
Hemoglobin: 12.3 g/dL — ABNORMAL LOW (ref 13.0–17.0)
MCH: 28.3 pg (ref 26.0–34.0)
MCHC: 31.4 g/dL (ref 30.0–36.0)
MCV: 90.3 fL (ref 80.0–100.0)
Platelets: 182 10*3/uL (ref 150–400)
RBC: 4.34 MIL/uL (ref 4.22–5.81)
RDW: 14.1 % (ref 11.5–15.5)
WBC: 5.6 10*3/uL (ref 4.0–10.5)
nRBC: 0 % (ref 0.0–0.2)

## 2021-11-08 LAB — URINALYSIS, ROUTINE W REFLEX MICROSCOPIC
Bacteria, UA: NONE SEEN
Bilirubin Urine: NEGATIVE
Glucose, UA: NEGATIVE mg/dL
Hgb urine dipstick: NEGATIVE
Ketones, ur: 5 mg/dL — AB
Nitrite: NEGATIVE
Protein, ur: NEGATIVE mg/dL
Specific Gravity, Urine: 1.015 (ref 1.005–1.030)
pH: 6 (ref 5.0–8.0)

## 2021-11-08 LAB — COMPREHENSIVE METABOLIC PANEL
ALT: 8 U/L (ref 0–44)
AST: 25 U/L (ref 15–41)
Albumin: 3.9 g/dL (ref 3.5–5.0)
Alkaline Phosphatase: 83 U/L (ref 38–126)
Anion gap: 7 (ref 5–15)
BUN: 19 mg/dL (ref 8–23)
CO2: 25 mmol/L (ref 22–32)
Calcium: 9.1 mg/dL (ref 8.9–10.3)
Chloride: 111 mmol/L (ref 98–111)
Creatinine, Ser: 1.2 mg/dL (ref 0.61–1.24)
GFR, Estimated: >60 mL/min (ref >60)
Glucose, Bld: 107 mg/dL — ABNORMAL HIGH (ref 70–99)
Potassium: 4 mmol/L (ref 3.5–5.1)
Sodium: 143 mmol/L (ref 135–145)
Total Bilirubin: 1.1 mg/dL (ref 0.3–1.2)
Total Protein: 7.7 g/dL (ref 6.5–8.1)

## 2021-11-08 MED ORDER — OLANZAPINE 15 MG PO TABS: 15.0000 mg | ORAL_TABLET | Freq: Every day | ORAL | 0 refills | Status: DC

## 2021-11-08 MED ORDER — OLANZAPINE 5 MG PO TABS: 5.0000 mg | ORAL_TABLET | Freq: Every day | ORAL | 0 refills | Status: DC | PRN

## 2021-11-08 MED ORDER — SENNOSIDES-DOCUSATE SODIUM 8.6-50 MG PO TABS
2.0000 | ORAL_TABLET | Freq: Every day | ORAL | Status: DC
  Administered 2021-11-08: 2 via ORAL
  Filled 2021-11-08: qty 2

## 2021-11-08 MED ORDER — OLANZAPINE 10 MG IM SOLR: 5.0000 mg | Freq: Every day | INTRAMUSCULAR | Status: DC | PRN

## 2021-11-08 MED ORDER — TAMSULOSIN HCL 0.4 MG PO CAPS
0.4000 mg | ORAL_CAPSULE | Freq: Every day | ORAL | Status: DC
  Administered 2021-11-08: 0.4 mg via ORAL
  Filled 2021-11-08: qty 1

## 2021-11-08 MED ORDER — OLANZAPINE 10 MG IM SOLR: 5.0000 mg | Freq: Every day | INTRAMUSCULAR | 0 refills | Status: DC | PRN

## 2021-11-08 MED ORDER — OLANZAPINE 5 MG PO TABS: 15.0000 mg | ORAL_TABLET | Freq: Every day | ORAL | Status: DC

## 2021-11-08 MED ORDER — CEPHALEXIN 500 MG PO CAPS
500.0000 mg | ORAL_CAPSULE | Freq: Two times a day (BID) | ORAL | Status: DC
  Administered 2021-11-08: 500 mg via ORAL
  Filled 2021-11-08: qty 1

## 2021-11-08 MED ORDER — DIVALPROEX SODIUM 250 MG PO DR TAB: 250.0000 mg | DELAYED_RELEASE_TABLET | Freq: Two times a day (BID) | ORAL | 0 refills | Status: DC

## 2021-11-08 MED ORDER — DIVALPROEX SODIUM 250 MG PO DR TAB
250.0000 mg | DELAYED_RELEASE_TABLET | Freq: Two times a day (BID) | ORAL | Status: DC
Start: 2021-11-08 — End: 2021-11-08
  Administered 2021-11-08: 250 mg via ORAL
  Filled 2021-11-08: qty 1

## 2021-11-08 MED ORDER — OLANZAPINE 5 MG PO TABS
5.0000 mg | ORAL_TABLET | Freq: Every day | ORAL | Status: DC | PRN
  Administered 2021-11-08: 5 mg via ORAL
  Filled 2021-11-08: qty 1

## 2021-11-08 MED ORDER — OLANZAPINE 5 MG PO TABS
5.0000 mg | ORAL_TABLET | ORAL | Status: DC | PRN
  Administered 2021-11-08: 5 mg via ORAL
  Filled 2021-11-08: qty 1

## 2021-11-08 MED ORDER — LISINOPRIL 5 MG PO TABS
5.0000 mg | ORAL_TABLET | Freq: Every day | ORAL | Status: DC
  Administered 2021-11-08: 5 mg via ORAL
  Filled 2021-11-08: qty 1

## 2021-11-08 NOTE — ED Notes (Signed)
Transportation called ° °

## 2021-11-08 NOTE — ED Notes (Addendum)
Report received from Keyri M, RN including SBAR. On initial round after report Pt is warm/dry, resting quietly in room without any s/s of distress.  Will continue to monitor throughout shift as ordered for any changes in behaviors and for continued safety.   

## 2021-11-08 NOTE — ED Notes (Signed)
Report called to Elliot Gault, RN @  [facility] memory care unit.  Informed that Pt is ready to discharge.  Jerrel Ivory stated they do not provide transportation and that we will have to send him back via non emergency EMS.

## 2021-11-08 NOTE — Consult Note (Addendum)
[place] Psychiatry Consult   Reason for Consult:  agitation and aggression Referring Physician:  EDP Patient Identification: Stanley Nielsen MRN:  [id number] Principal Diagnosis: Agitation Diagnosis:  Active Problems:   Dementia with behavioral disturbance ([place])   Total Time spent with patient: 45 minutes  Subjective:   Stanley Nielsen is a 81 y.o. male patient admitted with agitation and aggression from his SNF.  HPI:  81 yo male presented to the ED from his SNF for aggressive behaviors, history of dementia.  Client is heartily eating breakfast and has no concerns, pleasantly confused.  No suicidal/homicidal ideations, hallucinations, or substance abuse.  Rambling on assessment as he ate, no aggression in the ED.  Medication changes made to assist with agitation at his SNF, psychiatrically cleared to return to his SNF.  Caveat:  Client is currently being treated for a UTI which is most likely adding to his increase in agitation and aggression.  Per TTS, Stanley Nielsen is an 81 year old, English speaking, white male with no known PMH hx. Pt also has hx of dementia. Per triage note: Pt presents via EMS from [facility] for a psych eval. Hx of Dementia and has an increase in agitation and combativeness. Upon assessment pt. was oriented x3 with his memory being defective in the recent/short term. Pt reported that the current year is 1923 and was unable to name the current presided. Pt had lacking insight and impaired judgment. Pt had poor realty testing and was largely confused. Pt's speech and thought content was disorganized and irrelevant to the questions asked. Pt had an appropriate mood and a responsive affect. Pt unable to confirm/deny thoughts of SI, HI, AV/H.   Past Psychiatric History: dementia with aggressive behaviors  Risk to Self:  none Risk to Others:  none Prior Inpatient Therapy:  none Prior Outpatient Therapy:  none  Past Medical  History:  Past Medical History:  Diagnosis Date   CKD (chronic kidney disease), stage III ([place])    Dementia ([place])    Depression    Gout    HOH (hard of hearing)    Hypertension    Sleep apnea    Vertigo     Past Surgical History:  Procedure Laterality Date   CATARACT EXTRACTION W/PHACO Left 04/30/2015   Procedure: CATARACT EXTRACTION PHACO AND INTRAOCULAR LENS PLACEMENT ([place]);  Surgeon: Birder Robson, MD;  Location: [facility] ORS;  Service: Ophthalmology;  Laterality: Left;  [place] 00:47 AP% 23.5 CDE 11.16 fluid pack lot # [id number]   COLONOSCOPY     Family History:  Family History  Problem Relation Age of Onset   Dementia Mother    Alcoholism Father    Family Psychiatric  History: see above Social History:  Social History   Substance and Sexual Activity  Alcohol Use Yes   Comment: OCC     Social History   Substance and Sexual Activity  Drug Use No    Social History   Socioeconomic History   Marital status: Divorced    Spouse name: Not on file   Number of children: Not on file   Years of education: Not on file   Highest education level: Not on file  Occupational History   Not on file  Tobacco Use   Smoking status: Never   Smokeless tobacco: Never  Vaping Use   Vaping Use: Never used  Substance and Sexual Activity   Alcohol use: Yes    Comment: OCC   Drug use: No   Sexual  activity: Not Currently  Other Topics Concern   Not on file  Social History Narrative   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[employer].*       Emergency contact: Rutherford Limerick (daughter)   *Secretary, [place]; telephone not listed      Next of kin: Iktan Boyce (son): Telephone [phone]   Social Determinants of Health   Financial Resource Strain: Not on file  Food Insecurity: Not on file  Transportation Needs: Not on file  Physical Activity: Not on file  Stress: Not on file  Social Connections: Not on file   Additional Social History:    Allergies:   Allergies  Allergen Reactions    Penicillins Hives    Has patient had a PCN reaction causing immediate rash, facial/tongue/throat swelling, SOB or lightheadedness with hypotension: No Has patient had a PCN reaction causing severe rash involving mucus membranes or skin necrosis: No Has patient had a PCN reaction that required hospitalization: No Has patient had a PCN reaction occurring within the last 10 years: No If all of the above answers are "NO", then may proceed with Cephalosporin use.   Sulfa Antibiotics Nausea And Vomiting    Labs:  Results for orders placed or performed during the hospital encounter of 11/08/21 (from the past 48 hour(s))  CBC     Status: Abnormal   Collection Time: 11/08/21  2:08 AM  Result Value Ref Range   WBC 5.6 4.0 - 10.5 K/uL   RBC 4.34 4.22 - 5.81 MIL/uL   Hemoglobin 12.3 (L) 13.0 - 17.0 g/dL   HCT 39.2 39.0 - 52.0 %   MCV 90.3 80.0 - 100.0 fL   MCH 28.3 26.0 - 34.0 pg   MCHC 31.4 30.0 - 36.0 g/dL   RDW 14.1 11.5 - 15.5 %   Platelets 182 150 - 400 K/uL   nRBC 0.0 0.0 - 0.2 %    Comment: Performed at [facility], [address] [place], [place] [address]  Comprehensive metabolic panel     Status: Abnormal   Collection Time: 11/08/21  2:08 AM  Result Value Ref Range   Sodium 143 135 - 145 mmol/L   Potassium 4.0 3.5 - 5.1 mmol/L    Comment: HEMOLYSIS AT THIS LEVEL MAY AFFECT RESULT   Chloride 111 98 - 111 mmol/L   CO2 25 22 - 32 mmol/L   Glucose, Bld 107 (H) 70 - 99 mg/dL    Comment: Glucose reference range applies only to samples taken after fasting for at least 8 hours.   BUN 19 8 - 23 mg/dL   Creatinine, Ser 1.20 0.61 - 1.24 mg/dL   Calcium 9.1 8.9 - 10.3 mg/dL   Total Protein 7.7 6.5 - 8.1 g/dL   Albumin 3.9 3.5 - 5.0 g/dL   AST 25 15 - 41 U/L   ALT 8 0 - 44 U/L   Alkaline Phosphatase 83 38 - 126 U/L   Total Bilirubin 1.1 0.3 - 1.2 mg/dL   GFR, Estimated >60 >60 mL/min    Comment: (NOTE) Calculated using the CKD-EPI Creatinine Equation (2021)    Anion gap 7  5 - 15    Comment: Performed at [facility], [address] [place], [place] [address]  Urinalysis, Routine w reflex microscopic     Status: Abnormal   Collection Time: 11/08/21  4:20 AM  Result Value Ref Range   Color, Urine YELLOW (A) YELLOW   APPearance HAZY (A) CLEAR   Specific Gravity, Urine 1.015 1.005 -  1.030   pH 6.0 5.0 - 8.0   Glucose, UA NEGATIVE NEGATIVE mg/dL   Hgb urine dipstick NEGATIVE NEGATIVE   Bilirubin Urine NEGATIVE NEGATIVE   Ketones, ur 5 (A) NEGATIVE mg/dL   Protein, ur NEGATIVE NEGATIVE mg/dL   Nitrite NEGATIVE NEGATIVE   Leukocytes,Ua LARGE (A) NEGATIVE   RBC / HPF 11-20 0 - 5 RBC/hpf   WBC, UA 21-50 0 - 5 WBC/hpf   Bacteria, UA NONE SEEN NONE SEEN   Squamous Epithelial / LPF 6-10 0 - 5   Mucus PRESENT    Hyaline Casts, UA PRESENT     Comment: Performed at [facility], [address], [place], [place] [address]    Current Facility-Administered Medications  Medication Dose Route Frequency Provider Last Rate Last Admin   cephALEXin (KEFLEX) capsule 500 mg  500 mg Oral BID Patrecia Pour, NP       divalproex (DEPAKOTE) DR tablet 250 mg  250 mg Oral Q12H Jessia Kief, Asa Saunas, NP       lisinopril (ZESTRIL) tablet 5 mg  5 mg Oral Daily Conner Muegge Y, NP       OLANZapine (ZYPREXA) tablet 5 mg  5 mg Oral Daily PRN Patrecia Pour, NP       Or   OLANZapine (ZYPREXA) injection 5 mg  5 mg Intramuscular Daily PRN Patrecia Pour, NP       OLANZapine (ZYPREXA) tablet 15 mg  15 mg Oral QHS Patrecia Pour, NP       sennosides-docusate sodium (SENOKOT-S) 8.6-50 MG tablet 2 tablet  2 tablet Oral Daily Patrecia Pour, NP       tamsulosin (FLOMAX) capsule 0.4 mg  0.4 mg Oral Daily Patrecia Pour, NP       Current Outpatient Medications  Medication Sig Dispense Refill   cephALEXin (KEFLEX) 500 MG capsule Take 1 capsule (500 mg total) by mouth 2 (two) times daily. 14 capsule 0   hydrALAZINE (APRESOLINE) 10 MG tablet Take 10 mg by mouth 2 (two)  times daily as needed.     lisinopril (ZESTRIL) 5 MG tablet Take 5 mg by mouth daily.     OLANZapine (ZYPREXA) 15 MG tablet Take 1 tablet (15 mg total) by mouth at bedtime. 30 tablet 0   oxymetazoline (AFRIN) 0.05 % nasal spray Place 1 spray into both nostrils 2 (two) times daily as needed for congestion.     sennosides-docusate sodium (SENOKOT-S) 8.6-50 MG tablet Take 2 tablets by mouth daily.     tamsulosin (FLOMAX) 0.4 MG CAPS capsule Take 0.4 mg by mouth daily.      Musculoskeletal: Strength & Muscle Tone: within normal limits Gait & Station:  did not witness Patient leans: N/A  Psychiatric Specialty Exam: Physical Exam Vitals and nursing note reviewed.  Constitutional:      Appearance: Normal appearance.  HENT:     Head: Normocephalic.     Nose: Nose normal.  Pulmonary:     Effort: Pulmonary effort is normal.  Musculoskeletal:     Cervical back: Normal range of motion.  Neurological:     Mental Status: He is alert.  Psychiatric:        Attention and Perception: He is inattentive.        Mood and Affect: Mood is anxious.        Speech: Speech normal.        Behavior: Behavior normal. Behavior is cooperative.        Thought Content: Thought  content normal.        Cognition and Memory: Cognition is impaired. Memory is impaired.        Judgment: Judgment normal.    Review of Systems  HENT:         Teeth issues  Psychiatric/Behavioral:  Positive for memory loss. The patient is nervous/anxious.   All other systems reviewed and are negative.  Blood pressure (!) 159/84, pulse 71, temperature 98.7 F (37.1 C), resp. rate 18, height 5\' 9"  (1.753 m), weight 85 kg, SpO2 97 %.Body mass index is 27.67 kg/m.  General Appearance: Casual  Eye Contact:  Good  Speech:  Normal Rate  Volume:  Normal  Mood:  Anxious  Affect:  Congruent  Thought Process:  Irrelevant  Orientation:  Other:  person  Thought Content:  Logical  Suicidal Thoughts:  No  Homicidal Thoughts:  No  Memory:   Immediate;   Poor Recent;   Poor Remote;   Poor  Judgement:  Fair  Insight:  Lacking  Psychomotor Activity:  Normal  Concentration:  Concentration: Fair and Attention Span: Fair  Recall:  [employer] of Knowledge:  Fair  Language:  Good  Akathisia:  No  Handed:  Right  AIMS (if indicated):     Assets:  Housing Leisure Time Resilience Social Support  ADL's:  Impaired  Cognition:  Impaired,  Moderate  Sleep:         Physical Exam: Physical Exam Vitals and nursing note reviewed.  Constitutional:      Appearance: Normal appearance.  HENT:     Head: Normocephalic.     Nose: Nose normal.  Pulmonary:     Effort: Pulmonary effort is normal.  Musculoskeletal:     Cervical back: Normal range of motion.  Neurological:     Mental Status: He is alert.  Psychiatric:        Attention and Perception: He is inattentive.        Mood and Affect: Mood is anxious.        Speech: Speech normal.        Behavior: Behavior normal. Behavior is cooperative.        Thought Content: Thought content normal.        Cognition and Memory: Cognition is impaired. Memory is impaired.        Judgment: Judgment normal.   Review of Systems  HENT:         Teeth issues  Psychiatric/Behavioral:  Positive for memory loss. The patient is nervous/anxious.   All other systems reviewed and are negative. Blood pressure (!) 159/84, pulse 71, temperature 98.7 F (37.1 C), resp. rate 18, height 5\' 9"  (1.753 m), weight 85 kg, SpO2 97 %. Body mass index is 27.67 kg/m.  Treatment Plan Summary: Dementia with behavioral disturbance: Increased Zyprexa 15 mg at bedtime by adding Zyprexa 5 mg po or IM daily PRN agitation Started Depakote 250 mg BID  Disposition: No evidence of imminent risk to self or others at present.   Patient does not meet criteria for psychiatric inpatient admission.  Waylan Boga, NP 11/08/2021 9:48 AM

## 2021-11-08 NOTE — ED Provider Notes (Signed)
Spoke with the patient's cousin who is listed as his emergency contact.  He is understanding and agreeable with patient returning to his memory care unit.  We did discuss that psychiatry had made recommendation for medication adjustment  In addition, I attempted to contact the patient's daughter Riley Churches she is currently residing in [place], and emergency contact advised she is hard to get hold of at times due to being in [place].  At this point not able to reach her, but secretary is attempting to see if we can get a phone call to connect to her [place] number.  At this point, will patient discharging back to his memory care, a urine culture has been sent.  I do not see definitive evidence of an active urinary tract infection and his previous culture did not isolate a single organism.  This was recent.  Suspect behavioral changes secondary due to his dementia, but if culture does result positive and potentially infectious organism or UTI would recommend treatment be initiated on culture review     Sharyn Creamer, MD 11/08/21 1130

## 2021-11-08 NOTE — ED Triage Notes (Signed)
Pt presents via EMS from [facility] for a psych eval. Hx of Dementia and has an increase in agitation and combativeness. VSS with EMS.

## 2021-11-08 NOTE — ED Notes (Signed)
Called dietary at this time to send breakfast tray.

## 2021-11-08 NOTE — ED Notes (Signed)
Pt clean a/o x2, pt transported back to [facility] via EMS.  All patient belongings accounted for and returned to patient.  Transported via [facility] ems

## 2021-11-08 NOTE — Discharge Instructions (Addendum)
We have taken a sample of your urine and a running it for culture to evaluate for possible infection.  Our pharmacy will call you if bacteria is identified that needs treatment in the urinary tract.  Please have your medical team follow-up with you Monday for reevaluation, call to set up a appointment if necessary.  You have been seen in the Emergency Department (ED) today for a psychiatric complaint.  You have been evaluated by psychiatry and we believe you are safe to be discharged from the hospital to return to memory care at [facility] house.    Please return to the ED immediately if you have ANY thoughts of hurting yourself or anyone else, so that we may help you.  Please avoid alcohol and drug use.  Follow up with your doctor and/or therapist as soon as possible regarding today's ED visit.   Please follow up any other recommendations and clinic appointments provided by the psychiatry team that saw you in the Emergency Department.

## 2021-11-08 NOTE — ED Notes (Signed)
Pt placed on posey alarm. Fall risk wristband placed on pt

## 2021-11-08 NOTE — ED Notes (Signed)
Pt is eating lunch and drinking coca cola.

## 2021-11-08 NOTE — ED Provider Notes (Signed)
   [facility] Provider Note    Event Date/Time   First MD Initiated Contact with Patient 11/08/21 0201     (approximate)  History   Chief Complaint: Psychiatric Evaluation  HPI  Stanley Nielsen is a 81 y.o. male with a past medical history of CKD, hypertension, dementia comes from his nursing facility for increased agitation and combativeness.  According to EMS report patient is coming from [facility] facility.  Patient has a history of known dementia, tonight they state the patient was more aggressive and combative than typical so they sent the patient to the emergency department for psychiatric evaluation.  Patient here is calm cooperative he has no complaints.  Physical Exam   Triage Vital Signs: ED Triage Vitals  Enc Vitals Group     BP 11/08/21 0202 (!) 166/51     Pulse Rate 11/08/21 0202 77     Resp 11/08/21 0202 18     Temp 11/08/21 0202 98.4 F (36.9 C)     Temp Source 11/08/21 0202 Oral     SpO2 11/08/21 0202 97 %     Weight 11/08/21 0201 187 lb 6.3 oz (85 kg)     Height 11/08/21 0201 5\' 9"  (1.753 m)     Head Circumference --      Peak Flow --      Pain Score 11/08/21 0201 0     Pain Loc --      Pain Edu? --      Excl. in [place]? --     Most recent vital signs: Vitals:   11/08/21 0202  BP: (!) 166/51  Pulse: 77  Resp: 18  Temp: 98.4 F (36.9 C)  SpO2: 97%    General: Awake, no distress.  CV:  Good peripheral perfusion.  Regular rate and rhythm  Resp:  Normal effort.  Equal breath sounds bilaterally.  Abd:  No distention.  Soft, nontender.  No rebound or guarding.   ED Results / Procedures / Treatments   MEDICATIONS ORDERED IN ED: Medications - No data to display   IMPRESSION / MDM / [place] / ED COURSE  I reviewed the triage vital signs and the nursing notes.  Patient presents to the emergency department for agitation and increased combativeness.  Patient has a history of dementia.  Here the  patient is calm cooperative no distress.  Differential would include dementia with behavioral disturbance, electrolyte or metabolic abnormality or infectious etiology.  Patient was just seen in the emergency department by myself several days ago with a overall reassuring work-up at that time besides possible UTI.  We will recheck labs and a urinalysis today.   Lab work has resulted showing a normal CBC and CMP.  Patient's urinalysis does show 21-50 white blood cells but no bacteria.  We will send urine culture as a precaution.  Awaiting psychiatric and TTS evaluation  FINAL CLINICAL IMPRESSION(S) / ED DIAGNOSES   Dementia   Note:  This document was prepared using Dragon voice recognition software and may include unintentional dictation errors.   Harvest Dark, MD 11/08/21 [id number]

## 2021-11-08 NOTE — BH Assessment (Signed)
Comprehensive Clinical Assessment (CCA) Note  11/08/2021 Stanley Nielsen [id number] Recommendations for Services/Supports/Treatments: Pt pending psych consult/disposition.  Stanley Nielsen is an 81 year old, English speaking, white male with no known PMH hx. Pt also has hx of dementia. Per triage note: Pt presents via EMS from [place] for a psych eval. Hx of Dementia and has an increase in agitation and combativeness. Upon assessment pt. was oriented x3 with his memory being defective in the recent/short term. Pt reported that the current year is 1923 and was unable to name the current presided. Pt had lacking insight and impaired judgment. Pt had poor realty testing and was largely confused. Pt's speech and thought content was disorganized and irrelevant to the questions asked. Pt had an appropriate mood and a responsive affect. Pt unable to confirm/deny thoughts of SI, HI, AV/H.   Chief Complaint:  Chief Complaint  Patient presents with   Psychiatric Evaluation   Visit Diagnosis: Aggression Dementia    CCA Screening, Triage and Referral (STR)  Patient Reported Information How did you hear about [place]? Other (Comment) ([place])  Referral name: No data recorded Referral phone number: No data recorded  Whom do you see for routine medical problems? No data recorded Practice/Facility Name: No data recorded Practice/Facility Phone Number: No data recorded Name of Contact: No data recorded Contact Number: No data recorded Contact Fax Number: No data recorded Prescriber Name: No data recorded Prescriber Address (if known): No data recorded  What Is the Reason for Your Visit/Call Today? Pt presents via EMS from [place] for a psych eval. Hx of Dementia and has an increase in agitation and combativeness.  How Long Has This Been Causing You Problems? > than 6 months  What Do You Feel Would Help You the Most Today? -- (UTA)   Have You Recently  Been in Any Inpatient Treatment (Hospital/Detox/Crisis Center/28-Day Program)? No data recorded Name/Location of Program/Hospital:No data recorded How Long Were You There? No data recorded When Were You Discharged? No data recorded  Have You Ever Received Services From [facility] Before? No data recorded Who Do You See at [facility]? No data recorded  Have You Recently Had Any Thoughts About Hurting Yourself? No  Are You Planning to Commit Suicide/Harm Yourself At This time? No   Have you Recently Had Thoughts About Stanley Nielsen? No  Explanation: No data recorded  Have You Used Any Alcohol or Drugs in the Past 24 Hours? No  How Long Ago Did You Use Drugs or Alcohol? No data recorded What Did You Use and How Much? No data recorded  Do You Currently Have a Therapist/Psychiatrist? No  Name of Therapist/Psychiatrist: No data recorded  Have You Been Recently Discharged From Any Office Practice or Programs? No  Explanation of Discharge From Practice/Program: No data recorded    CCA Screening Triage Referral Assessment Type of Contact: Face-to-Face  Is this Initial or Reassessment? No data recorded Date Telepsych consult ordered in [facility]:  No data recorded Time Telepsych consult ordered in [facility]:  No data recorded  Patient Reported Information Reviewed? No data recorded Patient Left Without Being Seen? No data recorded Reason for Not Completing Assessment: No data recorded  Collateral Involvement: None provided   Does Patient Have a [place]? No data recorded Name and Contact of Legal Guardian: No data recorded If Minor and Not Living with Parent(s), Who has Custody? n/a  Is CPS involved or ever been involved? Never  Is APS involved  or ever been involved? Never   Patient Determined To Be At Risk for Harm To Self or Others Based on Review of Patient Reported Information or Presenting Complaint? No  Method: No data recorded Availability of  Means: No data recorded Intent: No data recorded Notification Required: No data recorded Additional Information for Danger to Others Potential: No data recorded Additional Comments for Danger to Others Potential: No data recorded Are There Guns or Other Weapons in Your Home? No data recorded Types of Guns/Weapons: No data recorded Are These Weapons Safely Secured?                            No data recorded Who Could Verify You Are Able To Have These Secured: No data recorded Do You Have any Outstanding Charges, Pending Court Dates, Parole/Probation? No data recorded Contacted To Inform of Risk of Harm To Self or Others: Other: Comment   Location of Assessment: [facility] ED   Does Patient Present under Involuntary Commitment? No  IVC Papers Initial File Date: No data recorded  [place] of Residence: [place]   Patient Currently Receiving the Following Services: [facility]   Determination of Need: Urgent (48 hours)   Options For Referral: ED Visit     CCA Biopsychosocial Intake/Chief Complaint:  No data recorded Current Symptoms/Problems: No data recorded  Patient Reported Schizophrenia/Schizoaffective Diagnosis in Past: No   Strengths: Pt has some insight  Preferences: No data recorded Abilities: No data recorded  Type of Services Patient Feels are Needed: No data recorded  Initial Clinical Notes/Concerns: No data recorded  Mental Health Symptoms Depression:   None   Duration of Depressive symptoms: No data recorded  Mania:   N/A   Anxiety:    N/A   Psychosis:   None   Duration of Psychotic symptoms: No data recorded  Trauma:   N/A   Obsessions:   N/A   Compulsions:   N/A   Inattention:   None   Hyperactivity/Impulsivity:   N/A   Oppositional/Defiant Behaviors:   Aggression towards people/animals; Temper   Emotional Irregularity:   Potentially harmful impulsivity; Intense/inappropriate anger   Other Mood/Personality  Symptoms:  No data recorded   Mental Status Exam Appearance and self-care  Stature:   Average   Weight:   Average weight   Clothing:   Age-appropriate   Grooming:   Normal   Cosmetic use:   None   Posture/gait:   Normal   Motor activity:   Restless   Sensorium  Attention:   Confused   Concentration:   Variable   Orientation:   Object; Person; Place   Recall/memory:   Defective in Short-term   Affect and Mood  Affect:   Full Range   Mood:   Euthymic   Relating  Eye contact:   Normal   Facial expression:   Responsive   Attitude toward examiner:   Cooperative   Thought and Language  Speech flow:  Clear and Coherent   Thought content:   Appropriate to Mood and Circumstances   Preoccupation:   None   Hallucinations:   None   Organization:  No data recorded  [employer] of Knowledge:   Fair; Impoverished by (Comment)   Intelligence:   Average   Abstraction:   Overly abstract   Judgement:   Impaired   Reality Testing:   Variable   Insight:   Lacking   Decision Making:   Only simple  Social Functioning  Social Maturity:   Impulsive   Social Judgement:   Impropriety   Stress  Stressors:   -- Special educational needs teacher)   Coping Ability:   Overwhelmed   Skill Deficits:   Self-control; Decision making   Supports:   Support needed; Friends/Service system; Family     Religion: Religion/Spirituality Are You A Religious Person?:  (UTA) How Might This Affect Treatment?: N/A  Leisure/Recreation: Leisure / Recreation Do You Have Hobbies?: Yes Leisure and Hobbies: Watching sports  Exercise/Diet: Exercise/Diet Do You Exercise?: No Have You Gained or Lost A Significant Amount of Weight in the Past Six Months?: No Do You Follow a Special Diet?: No Do You Have Any Trouble Sleeping?:  (UTA)   CCA Employment/Education Employment/Work Situation: Employment / Work Situation Employment Situation: Retired Social research officer, government  has Been Impacted by Current Illness: No Has Patient ever Been in 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)  Education: Education Is Patient Currently Attending School?: No Last Grade Completed:  (UTA) Did You Attend College?:  (UTA) Did You Have An Individualized Education Program (IIEP):  (UTA) Did You Have Any Difficulty At School?:  ([place]) Patient's Education Has Been Impacted by Current Illness: No   CCA Family/Childhood History Family and Relationship History: Family history Marital status: Divorced Divorced, when?: UTA What types of issues is patient dealing with in the relationship?: N/A Additional relationship information: N/A Does patient have children?:  (UTA)  Childhood History:  Childhood History By whom was/is the patient raised?:  (UTA) Did patient suffer any verbal/emotional/physical/sexual abuse as a child?:  (UTA) Did patient suffer from severe childhood neglect?:  (UTA) Has patient ever been sexually abused/assaulted/raped as an adolescent or adult?:  (UTA) Was the patient ever a victim of a crime or a disaster?:  (UTA) Witnessed domestic violence?:  (UTA) Has patient been affected by domestic violence as an adult?:  Special educational needs teacher)  Child/Adolescent Assessment:     CCA Substance Use Alcohol/Drug Use: Alcohol / Drug Use Pain Medications: See MAR Prescriptions: See MAR Over the Counter: See MAR History of alcohol / drug use?: No history of alcohol / drug abuse                         ASAM's:  Six Dimensions of Multidimensional Assessment  Dimension 1:  Acute Intoxication and/or Withdrawal Potential:      Dimension 2:  Biomedical Conditions and Complications:      Dimension 3:  Emotional, Behavioral, or Cognitive Conditions and Complications:     Dimension 4:  Readiness to Change:     Dimension 5:  Relapse, Continued use, or Continued Problem Potential:     Dimension 6:  Recovery/Living Environment:     ASAM Severity Score:    ASAM Recommended Level of Treatment:      Substance use Disorder (SUD)    Recommendations for Services/Supports/Treatments:    DSM5 Diagnoses: Patient Active Problem List   Diagnosis Date Noted   Sinus bradycardia 10/02/2021   Prolonged QT interval 09/21/2021   Poor dentition 09/21/2021   Impaired fasting glucose 09/21/2021   BPH (benign prostatic hyperplasia) 09/21/2021   Syncope 06/07/2021   Aggressive behavior 05/05/2020   Pain due to onychomycosis of toenails of both feet 12/18/2019   Dermatitis 12/18/2019   Diabetes mellitus type 2 in nonobese [facility])    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

## 2021-11-09 LAB — URINE CULTURE: Culture: <10000 — AB

## 2021-11-12 ENCOUNTER — Other Ambulatory Visit: Payer: Self-pay | Admitting: *Deleted

## 2021-11-12 ENCOUNTER — Ambulatory Visit: Payer: Self-pay

## 2021-11-12 DIAGNOSIS — R55 Syncope and collapse: Secondary | ICD-10-CM

## 2021-11-12 NOTE — Progress Notes (Signed)
2nd ZIO order placed.

## 2021-12-04 ENCOUNTER — Ambulatory Visit (INDEPENDENT_AMBULATORY_CARE_PROVIDER_SITE_OTHER): Payer: Medicaid Other | Admitting: Podiatry

## 2021-12-04 ENCOUNTER — Encounter: Payer: Self-pay | Admitting: Podiatry

## 2021-12-04 DIAGNOSIS — M79674 Pain in right toe(s): Secondary | ICD-10-CM | POA: Diagnosis not present

## 2021-12-04 DIAGNOSIS — M79675 Pain in left toe(s): Secondary | ICD-10-CM | POA: Diagnosis not present

## 2021-12-04 DIAGNOSIS — B351 Tinea unguium: Secondary | ICD-10-CM

## 2021-12-04 DIAGNOSIS — E119 Type 2 diabetes mellitus without complications: Secondary | ICD-10-CM

## 2021-12-04 DIAGNOSIS — N183 Chronic kidney disease, stage 3 unspecified: Secondary | ICD-10-CM

## 2021-12-04 NOTE — Progress Notes (Signed)
This patient returns to my office for at risk foot care.  This patient requires this care by a professional since this patient will be at risk due to having diabetes and CKD.  This patient is unable to cut nails himself since the patient cannot reach his nails.These nails are painful walking and wearing shoes.  This patient presents for at risk foot care today.  General Appearance  Alert, conversant and in no acute stress.  Vascular  Dorsalis pedis and posterior tibial  pulses are palpable  bilaterally.  Capillary return is within normal limits  bilaterally. Temperature is within normal limits  bilaterally.  Neurologic  Senn-Weinstein monofilament wire test within normal limits  bilaterally. Muscle power within normal limits bilaterally.  Nails Thick disfigured discolored nails with subungual debris  from hallux to fifth toes bilaterally. No evidence of bacterial infection or drainage bilaterally.  Orthopedic  No limitations of motion  feet .  No crepitus or effusions noted.  No bony pathology or digital deformities noted.  Skin  normotropic skin with no porokeratosis noted bilaterally.  No signs of infections or ulcers noted.     Onychomycosis  Pain in right toes  Pain in left toes  Consent was obtained for treatment procedures.   Mechanical debridement of nails 1-5  bilaterally performed with a nail nipper.  Filed with dremel without incident.    Return office visit   3 months                   Told patient to return for periodic foot care and evaluation due to potential at risk complications.   Joyann Spidle DPM   

## 2021-12-29 ENCOUNTER — Ambulatory Visit (INDEPENDENT_AMBULATORY_CARE_PROVIDER_SITE_OTHER): Payer: Medicare Other

## 2021-12-29 DIAGNOSIS — R55 Syncope and collapse: Secondary | ICD-10-CM

## 2021-12-29 DIAGNOSIS — I639 Cerebral infarction, unspecified: Secondary | ICD-10-CM | POA: Diagnosis not present

## 2022-01-01 ENCOUNTER — Ambulatory Visit (INDEPENDENT_AMBULATORY_CARE_PROVIDER_SITE_OTHER): Payer: Medicare Other | Admitting: Cardiology

## 2022-01-01 ENCOUNTER — Encounter: Payer: Self-pay | Admitting: Cardiology

## 2022-01-01 VITALS — BP 125/83 | HR 64 | Ht 67.0 in | Wt 179.0 lb

## 2022-01-01 DIAGNOSIS — I455 Other specified heart block: Secondary | ICD-10-CM | POA: Diagnosis not present

## 2022-01-01 DIAGNOSIS — I471 Supraventricular tachycardia: Secondary | ICD-10-CM

## 2022-01-01 DIAGNOSIS — R001 Bradycardia, unspecified: Secondary | ICD-10-CM | POA: Diagnosis not present

## 2022-01-01 DIAGNOSIS — R55 Syncope and collapse: Secondary | ICD-10-CM | POA: Diagnosis not present

## 2022-01-01 NOTE — Progress Notes (Signed)
Primary Care Provider: Reymundo Poll, MD Cardiologist: None Electrophysiologist: None  Clinic Note: Chief Complaint  Patient presents with   Follow-up    59-month follow-up-carotid Doppler results as well as ~3 days out of 28 intended Zio patch results   Loss of Consciousness    No further episodes   ===================================  ASSESSMENT/PLAN   Problem List Items Addressed This Visit       Cardiology Problems   Sinus pause    3.3-second pause-in of itself this probably did not cause syncope, however if he is prone to having such a pause, could have potentially had a longer pause which could have caused syncope.  Thankfully no further episodes noted.  But if we were only able to get less than 3 days out of a total of 28 intended days. See syncope section for discussion re: referral to electrophysiology for further evaluation and management.      Relevant Orders   EKG 12-Lead   Ambulatory referral to Cardiac Electrophysiology   SVT (supraventricular tachycardia) (HCC)    5 episodes of "SVT "/PAT runs with the longest and fastest being 18 beats.  Not likely to be the etiology for syncope unless he were to have prolonged episodes which again we cannot determine simply based on less than 3 days of Zio patch.   EP referral to determine if there is another potential way of monitoring, or if he would even be a consideration for treatments given sinus bradycardia and pauses as concomitant comorbidities..      Relevant Orders   EKG 12-Lead   Ambulatory referral to Cardiac Electrophysiology   Sinus bradycardia (Chronic)    Not necessarily bradycardic here today.  He is certainly not on any rate control agents as it stands.  Does not have QT prolongation on EKG. More concerning finding on the monitor was a 3.3-second pause.  Plan to refer to electrophysiology for further evaluation.        Other   Syncope - Primary    Thankfully, no further syncopal episodes. The  patient is unfortunately an unreliable historian.  Very difficult understand what is happening.  Somewhat frustrating that these patients are brought to clinic visits without someone accompany them to potentially provide some symptoms of information as to how the patient has been doing.  Zio patch was somewhat concerning for 3.3-second pause and short PAT runs.  This short PAT runs would not be enough to cause him to have syncope, but the pause if longer would definitely be a concern. Unfortunately he only wore the monitor for less than 3 days and I do not know what happened to the second monitor.  I do not think wearable monitors are a great option for him since he clearly did not tolerate wearing it.  My plan is to refer him to Stanley Nielsen from EP to evaluate.  Given this gentlemen's advanced age and dementia I am not certain he meets criteria for PPM placement normal I confident that he would be a great candidate for PPM placement however we are not able to fully evaluate simply based on the short less than 3 days of Zio patch.  Potentially could consider loop recorder because of recurrent syncope and suggestion of possible prolonged pauses.  As I am not the performing physician for either of these procedures, I will refer to EP to better assess. Thankfully, he has not had any further episodes of syncope.  I would continue to hold any rate slowing agents.  No AV nodal agents.  Monitoring use of medications that would prolong QT.  With no active angina or heart failure symptoms I do not think he needs to continue to follow-up with general cardiology.      Relevant Orders   EKG 12-Lead   Ambulatory referral to Cardiac Electrophysiology   ===================================  HPI:    Stanley Nielsen is a 81 y.o. male Resident of [facility] with a PMH notable for HTN, HLD, DM-2, prior CVA, OSA on CPAP, and significant dementia (with depression and behavioral disturbances)  who presents today for 41-month follow-up evaluation of syncope.Stanley Nielsen was seen on Oct 30, 2021 in consultation at the request of Stanley Cara, NP for evaluation of syncope and bradycardia following multiple [facility] hospital evaluations including possible syncope: December 2022 (12/17-19/2022:): Seizure ruled out, no clear etiology determined, normal echo and no arrhythmia noted April 2023  (4/1-3): Again syncope.  Apparently was in the chair and potentially had a fall getting up.  EKG suggests possible QT prolongation => this led to the discontinuation of galantamine and Seroquel.  Low-dose Toprol started along with as needed Zyprexa with consideration to discontinue Remeron.  Again echocardiogram normal. (4/1-3) readmitted with syncope/unresponsiveness -> this episode was thought to be potentially related to receiving pain medication after having multiple teeth pulled.  Became very agitated and confused.  Given Haldol and Ativan in the ER.  This led to development of delirium.  During the hospitalization he was noted to have profound bradycardia and Toprol was discontinued.  Was noted to be profoundly dehydrated with AKI. => During the clinic visit: He was extremely poor historian.  I was not able to get any accurate or adequate information out of him.  He frequently mentioned being in [place] (which apparently does correlate with his daughter living [place]).  He was not able to give much details at all about these episodes of syncope.  He does think that 1 episode may have been a mechanical fall.  Denied any chest pain pressure or dyspnea at rest or exertion.  No PND, orthopnea edema.  No sensation of palpitations although I am not sure he would remember based on his history providing. He had had 2 echocardiograms performed are both normal.  I chose to evaluate with carotid Dopplers (reviewed below) along with by intent to back-to-back 14-day Zio patch monitors of which he wore the  first 1 for 2 days and never wore the second 1.  Recent Hospitalizations:  11/04/2021: [facility] ER-presented from [facility] after a fall.  Apparently no reported LOC.  Minor injuries. => Head CT was normal.  UA suggest UTI.  Was given Rocephin in the ER and Keflex for discharge. 11/08/2021: [facility] ER-presented from [facility] due to progression of aggressive and combative behavior more than typical.  Was brought to the ER for psychiatric evaluation.  However upon evaluation the ER is very calm with no complaints. => ER physician did speak with the patient's cousin was listed as the emergency contact.  They discussed having the patient return to memory care unit and discuss outpatient psychiatric follow-up.  Dr. Jacqualine Code attempted to contact the patient daughter Stanley Nielsen, who resides in [place]), but was unable to get a hold of her due to the time differences in [place].  Reviewed  CV studies:    The following studies were reviewed today: (if available, images/films reviewed: From Epic Chart or Care Everywhere) Carotid Dopplers 12/29/2021: Essentially normal carotids, vertebrals  and subclavian arteries. Zio patch monitor (only 2 days - 21 hours of recording out of  the initial 14-day patch, second 14-day patch was never worn) no indications of why from the nursing facility: Abnormal monitor, with concerning presentation symptoms being syncope, the 3.3-second pause is potentially worrisome. Despite being a 7-day order, the patch was worn for 2 days and 21 hours; patient diary unreliable Predominant rhythm sinus rhythm with a rate range of 46-126 bpm, AVG 75 bpm. There is one 3.3-second pause noted 5 atrial runs: Fastest and longest was 18 beats-max rate 156 bpm/AVG 141 bpm; Rare PACs and PVCs.  Interval History:   Randie Tallarico Deford returns here today for follow-up.  Unfortunately he is here by himself, and is extremely unreliable historian.  His speech is somewhat  garbled and mumbled and very difficult to understand.  As best I can tell from him that he has not had any further syncope episodes or falls (this is despite the fact that he did go to the ER in May after a fall).  There was no documented evidence of syncope however. Remainder the cardiac review of symptoms seems to be relatively negative.  No complaints of chest pain, pressure or dyspnea with rest or exertion.  Quite likely he is not all that active, but denies any exertional dyspnea.  No suggestion of irregular heartbeats or palpitations.  Does not appear that he has had any syncopal episodes.  Nothing to suggest PND, orthopnea or edema.-At least nothing from the facility that except suggest his symptoms.  REVIEWED OF SYSTEMS   Review of Systems  Unable to perform ROS: Dementia (Patient has dementia, very poor historian.  Not able to reliably provide information.)   I have reviewed and (if needed) personally updated the patient's problem list, medications, allergies, past medical and surgical history, social and family history.   PAST MEDICAL HISTORY   Past Medical History:  Diagnosis Date   CKD (chronic kidney disease), stage III (HCC)    Dementia (HCC)    Depression    Gout    HOH (hard of hearing)    Hypertension    Sleep apnea    Vertigo     PAST SURGICAL HISTORY   Past Surgical History:  Procedure Laterality Date   CATARACT EXTRACTION W/PHACO Left 04/30/2015   Procedure: CATARACT EXTRACTION PHACO AND INTRAOCULAR LENS PLACEMENT (IOC);  Surgeon: Galen Manila, MD;  Location: [facility] ORS;  Service: Ophthalmology;  Laterality: Left;  [place] 00:47 AP% 23.5 CDE 11.16 fluid pack lot # [id number]   COLONOSCOPY    TTE 06/08/2021: EF 60 to 65%.  No RWMA.  Normal valves. TTE 09/21/2021: EF 60 to 65%.  Moderate LVH.  GR 1 DD.  Mild LA dilation.  Normal RV/RVP and RAP.  Aortic sclerosis without stenosis.   Immunization History  Administered Date(s) Administered   Influenza, High Dose  Seasonal PF 03/28/2017    MEDICATIONS/ALLERGIES   Current Meds  Medication Sig   divalproex (DEPAKOTE) 250 MG DR tablet Take 1 tablet (250 mg total) by mouth every 12 (twelve) hours.   hydrALAZINE (APRESOLINE) 10 MG tablet Take 10 mg by mouth 2 (two) times daily as needed.   lisinopril (ZESTRIL) 5 MG tablet Take 5 mg by mouth daily.   OLANZapine (ZYPREXA) 5 MG tablet Take 1 tablet (5 mg total) by mouth daily as needed (agitation).   oxymetazoline (AFRIN) 0.05 % nasal spray Place 1 spray into both nostrils 2 (two) times daily as needed for congestion.  tamsulosin (FLOMAX) 0.4 MG CAPS capsule Take 0.4 mg by mouth daily.    Allergies  Allergen Reactions   Penicillins Hives    Has patient had a PCN reaction causing immediate rash, facial/tongue/throat swelling, SOB or lightheadedness with hypotension: No Has patient had a PCN reaction causing severe rash involving mucus membranes or skin necrosis: No Has patient had a PCN reaction that required hospitalization: No Has patient had a PCN reaction occurring within the last 10 years: No If all of the above answers are "NO", then may proceed with Cephalosporin use.   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  Constitutional:      General: He is not in acute distress.    Appearance: He is normal weight. He is not toxic-appearing.     Comments: Seems relatively well groomed and reasonably well-nourished.  HENT:     Head: Normocephalic and atraumatic.  Neck:     Vascular: No carotid bruit.  Cardiovascular:     Rate and Rhythm: Normal rate and regular rhythm.     Pulses: Normal pulses.     Heart sounds: Murmur (Soft 1/6 SEM at RUSB) heard.     No friction rub. No gallop.  Pulmonary:     Effort: Pulmonary effort is normal. No respiratory distress.     Breath sounds: Normal breath sounds. No wheezing, rhonchi or rales.  Chest:     Chest wall: No tenderness.  Abdominal:     General: Abdomen is flat. Bowel sounds are normal. There is no distension.     Palpations: Abdomen is soft.     Tenderness: There is no abdominal tenderness.  Musculoskeletal:        General: Swelling (Trivial ankle) present. Normal range of motion.     Cervical back: Normal range of motion and neck supple.  Skin:    General: Skin is warm and dry.  Neurological:     Mental Status: He is alert. Mental status is at baseline. He is disoriented.     Comments: Alert, oriented to himself, but not to place, date, time.  Psychiatric:     Comments: Mumbled speech.  Answers simple yes and no questions relatively well, but tends to going to long discussions that are somewhat nonsensical.     Adult ECG Report  Rate: 64;  Rhythm: normal sinus rhythm, premature atrial contractions (PAC), and nonspecific ST-T wave changes. ;   Narrative Interpretation: Stable  Recent  Labs: Reviewed Lab Results  Component Value Date   CHOL 169 06/08/2021   HDL 34 (L) 06/08/2021   LDLCALC 113 (H) 06/08/2021   TRIG 112 06/08/2021   CHOLHDL 5.0 06/08/2021   Lab Results  Component Value Date   CREATININE 1.20 11/08/2021   BUN 19 11/08/2021   NA 143 11/08/2021    K 4.0 11/08/2021   CL 111 11/08/2021   CO2 25 11/08/2021      Latest Ref Rng & Units 11/08/2021    2:08 AM 11/04/2021    7:17 AM 10/03/2021    5:55 AM  CBC  WBC 4.0 - 10.5 K/uL 5.6  5.1  6.4   Hemoglobin 13.0 - 17.0 g/dL 12.3  11.9  13.2   Hematocrit 39.0 - 52.0 % 39.2  36.9  40.2   Platelets 150 - 400 K/uL 182  157  172     Lab Results  Component Value Date   HGBA1C 5.9 (H) 09/21/2021   Lab Results  Component Value Date   TSH 1.861 10/02/2021    ==================================================  COVID-19 Education: The signs and symptoms of COVID-19 were discussed with the patient and how to seek care for testing (follow up with PCP or arrange E-visit).    I spent a total of 29 minutes with the patient spent in direct patient consultation.  Additional time spent with chart review  / charting (studies, outside notes, etc): 27 min Total Time: 56 min  Current medicines are reviewed at length with the patient today.  (+/- concerns) N/A  This visit occurred during the SARS-CoV-2 public health emergency.  Safety protocols were in place, including screening questions prior to the visit, additional usage of staff PPE, and extensive cleaning of exam room while observing appropriate contact time as indicated for disinfecting solutions.  Notice: This dictation was prepared with Dragon dictation along with smart phrase technology. Any transcriptional errors that result from this process are unintentional and may not be corrected upon review.  Studies Ordered:  Orders Placed This Encounter  Procedures   Ambulatory referral to Cardiac Electrophysiology   EKG 12-Lead   No orders of the defined types were placed in this encounter.   Patient Instructions / Medication Changes & Studies & Tests Ordered   Patient Instructions  Medication Instructions:   Your physician recommends that you continue on your current medications as directed. Please refer to the Current Medication list  given to you today.  *If you need a refill on your cardiac medications before your next appointment, please call your pharmacy*   Lab Work:  None ordered  Testing/Procedures:  None ordered   Follow-Up: At [facility], you and your health needs are our priority.  As part of our continuing mission to provide you with exceptional heart care, we have created designated Provider Care Teams.  These Care Teams include your primary Cardiologist (physician) and Advanced Practice Providers (APPs -  Physician Assistants and Nurse Practitioners) who all work together to provide you with the care you need, when you need it.  We recommend signing up for the patient portal called "MyChart".  Sign up information is provided on this After Visit Summary.  MyChart is used to connect with patients for Virtual Visits (Telemedicine).  Patients are able to view lab/test results, encounter notes, upcoming appointments, etc.  Non-urgent messages can be sent to your provider as well.   To learn more about what you can do with MyChart, go to [email or web address].    Your next appointment:  You have been referred to Dr. Graciela Husbands with EP for syncope / sinus pause / SVT  Important Information About [employer]            Bryan Lemma, M.D., M.S. Interventional Cardiologist   Pager # [phone] Phone # [phone] [address] [place], [place] [address]   Thank you for choosing [facility] [place]!!

## 2022-01-01 NOTE — Progress Notes (Incomplete)
Primary Care Provider: Florentina Jenny, MD Cardiologist: None Electrophysiologist: None  Clinic Note: No chief complaint on file.  ===================================  ASSESSMENT/PLAN   Problem List Items Addressed This Visit     Syncope - Primary   Relevant Orders   EKG 12-Lead   Ambulatory referral to Cardiac Electrophysiology   Other Visit Diagnoses     Sinus pause       Relevant Orders   EKG 12-Lead   Ambulatory referral to Cardiac Electrophysiology   SVT (supraventricular tachycardia) (HCC)       Relevant Orders   EKG 12-Lead   Ambulatory referral to Cardiac Electrophysiology      ===================================  HPI:    Stanley Nielsen is a 81 y.o. male Resident of [facility] with a PMH notable for HTN, HLD, DM-2, prior CVA, OSA on CPAP, and significant dementia (with depression and behavioral disturbances) who presents today for 102-month follow-up evaluation of syncope.Stanley Nielsen was seen on Oct 30, 2021 in consultation at the request of Martie Round, NP for evaluation of syncope and bradycardia following multiple [facility] hospital evaluations including possible syncope: December 2022 (12/17-19/2022:): Seizure ruled out, no clear etiology determined, normal echo and no arrhythmia noted April 2023  (4/1-3): Again syncope.  Apparently was in the chair and potentially had a fall getting up.  EKG suggests possible QT prolongation => this led to the discontinuation of galantamine and Seroquel.  Low-dose Toprol started along with as needed Zyprexa with consideration to discontinue Remeron.  Again echocardiogram normal. (4/1-3) readmitted with syncope/unresponsiveness -> this episode was thought to be potentially related to receiving pain medication after having multiple teeth pulled.  Became very agitated and confused.  Given Haldol and Ativan in the ER.  This led to development of delirium.  During the hospitalization he was  noted to have profound bradycardia and Toprol was discontinued.  Was noted to be profoundly dehydrated with AKI. => During the clinic visit: He was extremely poor historian.  I was not able to get any accurate or adequate information out of him.  He frequently mentioned being in [place] (which apparently does correlate with his daughter living [place]).  He was not able to give much details at all about these episodes of syncope.  He does think that 1 episode may have been a mechanical fall.  Denied any chest pain pressure or dyspnea at rest or exertion.  No PND, orthopnea edema.  No sensation of palpitations although I am not sure he would remember based on his history providing. He had had 2 echocardiograms performed are both normal.  I chose to evaluate with carotid Dopplers (reviewed below) along with by intent to back-to-back 14-day Zio patch monitors of which he wore the first 1 for 2 days and never wore the second 1.  Recent Hospitalizations:  11/04/2021: [facility] ER-presented from [facility] after a fall.  Apparently no reported LOC.  Minor injuries. => Head CT was normal.  UA suggest UTI.  Was given Rocephin in the ER and Keflex for discharge. 11/08/2021: [facility] ER-presented from [facility] due to progression of aggressive and combative behavior more than typical.  Was brought to the ER for psychiatric evaluation.  However upon evaluation the ER is very calm with no complaints. => ER physician did speak with the patient's cousin was listed as the emergency contact.  They discussed having the patient return to memory care unit and discuss outpatient psychiatric follow-up.  Dr. Fanny Bien attempted  to contact the patient daughter Stanley Nielsen, who resides in [place]), but was unable to get a hold of her due to the time differences in [place].  Reviewed  CV studies:    The following studies were reviewed today: (if available, images/films reviewed: From Epic Chart or Care  Everywhere) Carotid Dopplers 12/29/2021: Essentially normal carotids, vertebrals and subclavian arteries. Zio patch monitor (only 2 days - 21 hours of recording out of  the initial 14-day patch, second 14-day patch was never worn) no indications of why from the nursing facility: Abnormal monitor, with concerning presentation symptoms being syncope, the 3.3-second pause is potentially worrisome. Despite being a 7-day order, the patch was worn for 2 days and 21 hours; patient diary unreliable Predominant rhythm sinus rhythm with a rate range of 46-126 bpm, AVG 75 bpm. There is one 3.3-second pause noted 5 atrial runs: Fastest and longest was 18 beats-max rate 156 bpm/AVG 141 bpm; Rare PACs and PVCs.  Interval History:   Stanley Nielsen returns here today for follow-up.  Unfortunately he is here by himself, and is extremely unreliable historian.  His speech is somewhat garbled and mumbled and very difficult to understand.  As best I can tell from him that he has not had any further syncope episodes or falls (this is despite the fact that he did go to the ER in May after a fall).  There was no documented evidence of syncope however. Remainder the cardiac review of symptoms seems to be relatively negative.  No complaints of chest pain, pressure or dyspnea with rest or exertion.  Quite likely he is not all that active, but denies any exertional dyspnea.  No suggestion of irregular heartbeats or palpitations.  Does not appear that he has had any syncopal episodes.  Nothing to suggest PND, orthopnea or edema.-At least nothing from the facility that except suggest his symptoms.  REVIEWED OF SYSTEMS   Review of Systems  Unable to perform ROS: Dementia (Patient has dementia, very poor historian.  Not able to reliably provide information.)   I have reviewed and (if needed) personally updated the patient's problem list, medications, allergies, past medical and surgical history, social and family history.    PAST MEDICAL HISTORY   Past Medical History:  Diagnosis Date  . CKD (chronic kidney disease), stage III ([place])   . Dementia ([place])   . Depression   . Gout   . HOH (hard of hearing)   . Hypertension   . Sleep apnea   . Vertigo     PAST SURGICAL HISTORY   Past Surgical History:  Procedure Laterality Date  . CATARACT EXTRACTION W/PHACO Left 04/30/2015   Procedure: CATARACT EXTRACTION PHACO AND INTRAOCULAR LENS PLACEMENT (IOC);  Surgeon: Birder Robson, MD;  Location: [facility] ORS;  Service: Ophthalmology;  Laterality: Left;  [place] 00:47 AP% 23.5 CDE 11.16 fluid pack lot # [id number]  . COLONOSCOPY    TTE 06/08/2021: EF 60 to 65%.  No RWMA.  Normal valves. TTE 09/21/2021: EF 60 to 65%.  Moderate LVH.  GR 1 DD.  Mild LA dilation.  Normal RV/RVP and RAP.  Aortic sclerosis without stenosis.   Immunization History  Administered Date(s) Administered  . Influenza, High Dose Seasonal PF 03/28/2017    MEDICATIONS/ALLERGIES   Current Meds  Medication Sig  . divalproex (DEPAKOTE) 250 MG DR tablet Take 1 tablet (250 mg total) by mouth every 12 (twelve) hours.  . hydrALAZINE (APRESOLINE) 10 MG tablet Take 10 mg by mouth 2 (two) times daily  as needed.  Marland Kitchen lisinopril (ZESTRIL) 5 MG tablet Take 5 mg by mouth daily.  Marland Kitchen OLANZapine (ZYPREXA) 5 MG tablet Take 1 tablet (5 mg total) by mouth daily as needed (agitation).  Marland Kitchen oxymetazoline (AFRIN) 0.05 % nasal spray Place 1 spray into both nostrils 2 (two) times daily as needed for congestion.  . tamsulosin (FLOMAX) 0.4 MG CAPS capsule Take 0.4 mg by mouth daily.    Allergies  Allergen Reactions  . Penicillins Hives    Has patient had a PCN reaction causing immediate rash, facial/tongue/throat swelling, SOB or lightheadedness with hypotension: No Has patient had a PCN reaction causing severe rash involving mucus membranes or skin necrosis: No Has patient had a PCN reaction that required hospitalization: No Has patient had a PCN reaction occurring  within the last 10 years: No If all of the above answers are "NO", then may proceed with Cephalosporin use.  . Sulfa Antibiotics Nausea And Vomiting    SOCIAL HISTORY/FAMILY HISTORY   Reviewed in Epic:  Pertinent findings:  Social History   Tobacco Use  . Smoking status: Never  . Smokeless tobacco: Never  Vaping Use  . Vaping Use: Never used  Substance Use Topics  . Alcohol use: Yes    Comment: OCC  . 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  Constitutional:      General: He is not in acute distress.    Appearance: He is normal weight. He is not toxic-appearing.     Comments: Seems relatively well groomed and reasonably well-nourished.  HENT:     Head: Normocephalic and atraumatic.  Neck:     Vascular: No carotid bruit.  Cardiovascular:     Rate and Rhythm: Normal rate and regular rhythm.     Pulses: Normal pulses.     Heart sounds: Murmur (Soft 1/6 SEM at RUSB) heard.     No friction rub. No gallop.  Pulmonary:     Effort: Pulmonary effort is normal. No respiratory distress.     Breath sounds: Normal breath sounds. No wheezing, rhonchi or rales.  Chest:     Chest wall: No tenderness.  Abdominal:     General: Abdomen is flat. Bowel sounds are normal. There is no distension.     Palpations: Abdomen is soft.     Tenderness: There is no abdominal tenderness.  Musculoskeletal:        General: Swelling (Trivial ankle) present. Normal range of motion.      Cervical back: Normal range of motion and neck supple.  Skin:    General: Skin is warm and dry.  Neurological:     Mental Status: He is alert. Mental status is at baseline. He is disoriented.     Comments: Alert, oriented to himself, but not to place, date, time.  Psychiatric:  Comments: Mumbled speech.  Answers simple yes and no questions relatively well, but tends to going to long discussions that are somewhat nonsensical.     Adult ECG Report  Rate: 64;  Rhythm: normal sinus rhythm, premature atrial contractions (PAC), and nonspecific ST-T wave changes. ;   Narrative Interpretation: Stable  Recent Labs: Reviewed Lab Results  Component Value Date   CHOL 169 06/08/2021   HDL 34 (L) 06/08/2021   LDLCALC 113 (H) 06/08/2021   TRIG 112 06/08/2021   CHOLHDL 5.0 06/08/2021   Lab Results  Component Value Date   CREATININE 1.20 11/08/2021   BUN 19 11/08/2021   NA 143 11/08/2021   K 4.0 11/08/2021   CL 111 11/08/2021   CO2 25 11/08/2021      Latest Ref Rng & Units 11/08/2021    2:08 AM 11/04/2021    7:17 AM 10/03/2021    5:55 AM  CBC  WBC 4.0 - 10.5 K/uL 5.6  5.1  6.4   Hemoglobin 13.0 - 17.0 g/dL 77.8  24.2  35.3   Hematocrit 39.0 - 52.0 % 39.2  36.9  40.2   Platelets 150 - 400 K/uL 182  157  172     Lab Results  Component Value Date   HGBA1C 5.9 (H) 09/21/2021   Lab Results  Component Value Date   TSH 1.861 10/02/2021    ==================================================  COVID-19 Education: The signs and symptoms of COVID-19 were discussed with the patient and how to seek care for testing (follow up with PCP or arrange E-visit).    I spent a total of 29 minutes with the patient spent in direct patient consultation.  Additional time spent with chart review  / charting (studies, outside notes, etc): 27 min Total Time: 56 min  Current medicines are reviewed at length with the patient today.  (+/- concerns) N/A  This visit occurred during the SARS-CoV-2  public health emergency.  Safety protocols were in place, including screening questions prior to the visit, additional usage of staff PPE, and extensive cleaning of exam room while observing appropriate contact time as indicated for disinfecting solutions.  Notice: This dictation was prepared with Dragon dictation along with smart phrase technology. Any transcriptional errors that result from this process are unintentional and may not be corrected upon review.  Studies Ordered:  Orders Placed This Encounter  Procedures  . Ambulatory referral to Cardiac Electrophysiology  . EKG 12-Lead   No orders of the defined types were placed in this encounter.   Patient Instructions / Medication Changes & Studies & Tests Ordered   Patient Instructions  Medication Instructions:   Your physician recommends that you continue on your current medications as directed. Please refer to the Current Medication list given to you today.  *If you need a refill on your cardiac medications before your next appointment, please call your pharmacy*   Lab Work:  None ordered  Testing/Procedures:  None ordered   Follow-Up: At [facility], you and your health needs are our priority.  As part of our continuing mission to provide you with exceptional heart care, we have created designated Provider Care Teams.  These Care Teams include your primary Cardiologist (physician) and Advanced Practice Providers (APPs -  Physician Assistants and Nurse Practitioners) who all work together to provide you with the care you need, when you need it.  We recommend signing up for the patient portal called "MyChart".  Sign up information is provided on this After Visit Summary.  MyChart is  used to connect with patients for Virtual Visits (Telemedicine).  Patients are able to view lab/test results, encounter notes, upcoming appointments, etc.  Non-urgent messages can be sent to your provider as well.   To learn more about what you  can do with MyChart, go to [email or web address].    Your next appointment:    You have been referred to Dr. Caryl Comes with EP for syncope / sinus pause / SVT  Important Information About Sugar            Glenetta Hew, M.D., M.S. Interventional Cardiologist   Pager # [phone] Phone # [phone] [address] [place], [place] [address]   Thank you for choosing [facility] [place]!!

## 2022-01-01 NOTE — Patient Instructions (Signed)
Medication Instructions:   Your physician recommends that you continue on your current medications as directed. Please refer to the Current Medication list given to you today.  *If you need a refill on your cardiac medications before your next appointment, please call your pharmacy*   Lab Work:  None ordered  Testing/Procedures:  None ordered   Follow-Up: At [facility], you and your health needs are our priority.  As part of our continuing mission to provide you with exceptional heart care, we have created designated Provider Care Teams.  These Care Teams include your primary Cardiologist (physician) and Advanced Practice Providers (APPs -  Physician Assistants and Nurse Practitioners) who all work together to provide you with the care you need, when you need it.  We recommend signing up for the patient portal called "MyChart".  Sign up information is provided on this After Visit Summary.  MyChart is used to connect with patients for Virtual Visits (Telemedicine).  Patients are able to view lab/test results, encounter notes, upcoming appointments, etc.  Non-urgent messages can be sent to your provider as well.   To learn more about what you can do with MyChart, go to [email or web address].    Your next appointment:    You have been referred to Dr. Graciela Husbands with EP for syncope / sinus pause / SVT  Important Information About Sugar

## 2022-01-02 ENCOUNTER — Encounter: Payer: Self-pay | Admitting: Cardiology

## 2022-01-02 DIAGNOSIS — I455 Other specified heart block: Secondary | ICD-10-CM | POA: Insufficient documentation

## 2022-01-02 DIAGNOSIS — I471 Supraventricular tachycardia: Secondary | ICD-10-CM | POA: Insufficient documentation

## 2022-01-02 NOTE — Assessment & Plan Note (Signed)
3.3-second pause-in of itself this probably did not cause syncope, however if he is prone to having such a pause, could have potentially had a longer pause which could have caused syncope.  Thankfully no further episodes noted.  But if we were only able to get less than 3 days out of a total of 28 intended days. See syncope section for discussion re: referral to electrophysiology for further evaluation and management.

## 2022-01-02 NOTE — Assessment & Plan Note (Signed)
5 episodes of "SVT "/PAT runs with the longest and fastest being 18 beats.  Not likely to be the etiology for syncope unless he were to have prolonged episodes which again we cannot determine simply based on less than 3 days of Zio patch.   EP referral to determine if there is another potential way of monitoring, or if he would even be a consideration for treatments given sinus bradycardia and pauses as concomitant comorbidities.Marland Kitchen

## 2022-01-02 NOTE — Assessment & Plan Note (Signed)
Thankfully, no further syncopal episodes. The patient is unfortunately an unreliable historian.  Very difficult understand what is happening.  Somewhat frustrating that these patients are brought to clinic visits without someone accompany them to potentially provide some symptoms of information as to how the patient has been doing.  Zio patch was somewhat concerning for 3.3-second pause and short PAT runs.  This short PAT runs would not be enough to cause him to have syncope, but the pause if longer would definitely be a concern. Unfortunately he only wore the monitor for less than 3 days and I do not know what happened to the second monitor.  I do not think wearable monitors are a great option for him since he clearly did not tolerate wearing it.  My plan is to refer him to Dr. Graciela Husbands from EP to evaluate.  Given this gentlemen's advanced age and dementia I am not certain he meets criteria for PPM placement normal I confident that he would be a great candidate for PPM placement however we are not able to fully evaluate simply based on the short less than 3 days of Zio patch.  Potentially could consider loop recorder because of recurrent syncope and suggestion of possible prolonged pauses.  As I am not the performing physician for either of these procedures, I will refer to EP to better assess. Thankfully, he has not had any further episodes of syncope.  I would continue to hold any rate slowing agents.  No AV nodal agents.  Monitoring use of medications that would prolong QT.  With no active angina or heart failure symptoms I do not think he needs to continue to follow-up with general cardiology.

## 2022-01-02 NOTE — Assessment & Plan Note (Signed)
Not necessarily bradycardic here today.  He is certainly not on any rate control agents as it stands.  Does not have QT prolongation on EKG. More concerning finding on the monitor was a 3.3-second pause.  Plan to refer to electrophysiology for further evaluation.

## 2022-02-05 ENCOUNTER — Ambulatory Visit (INDEPENDENT_AMBULATORY_CARE_PROVIDER_SITE_OTHER): Payer: Medicare Other | Admitting: Internal Medicine

## 2022-02-05 ENCOUNTER — Encounter: Payer: Self-pay | Admitting: Internal Medicine

## 2022-02-05 VITALS — BP 163/74 | HR 57 | Ht 67.0 in | Wt 177.1 lb

## 2022-02-05 DIAGNOSIS — I471 Supraventricular tachycardia: Secondary | ICD-10-CM

## 2022-02-05 DIAGNOSIS — I455 Other specified heart block: Secondary | ICD-10-CM | POA: Diagnosis not present

## 2022-02-05 DIAGNOSIS — R55 Syncope and collapse: Secondary | ICD-10-CM | POA: Diagnosis not present

## 2022-02-05 DIAGNOSIS — R001 Bradycardia, unspecified: Secondary | ICD-10-CM

## 2022-02-05 NOTE — Patient Instructions (Signed)
Medication Instructions:  - Your physician has recommended you make the following change in your medication:   1) CHANGE lisinopril 5 mg: - take 1 tablet by mouth once daily AT BEDTIME  *If you need a refill on your cardiac medications before your next appointment, please call your pharmacy*   Lab Work: - none ordered  If you have labs (blood work) drawn today and your tests are completely normal, you will receive your results only by: MyChart Message (if you have MyChart) OR A paper copy in the mail If you have any lab test that is abnormal or we need to change your treatment, we will call you to review the results.   Testing/Procedures: - none ordered   Follow-Up: At [facility], you and your health needs are our priority.  As part of our continuing mission to provide you with exceptional heart care, we have created designated Provider Care Teams.  These Care Teams include your primary Cardiologist (physician) and Advanced Practice Providers (APPs -  Physician Assistants and Nurse Practitioners) who all work together to provide you with the care you need, when you need it.  We recommend signing up for the patient portal called "MyChart".  Sign up information is provided on this After Visit Summary.  MyChart is used to connect with patients for Virtual Visits (Telemedicine).  Patients are able to view lab/test results, encounter notes, upcoming appointments, etc.  Non-urgent messages can be sent to your provider as well.   To learn more about what you can do with MyChart, go to [email or web address].    Your next appointment:   6 month(s)  The format for your next appointment:   In Person  Provider:   Sherryl Manges, MD    Other Instructions  1) Please obtain and wear an abdominal binder during your waking hours  Important Information About Sugar

## 2022-02-05 NOTE — Progress Notes (Signed)
ELECTROPHYSIOLOGY CONSULT NOTE  Patient ID: Stanley Nielsen, MRN: [id number], DOB/AGE: 1940/10/01 81 y.o. Admit date: (Not on file) Date of Consult: 02/05/2022  Primary Physician: Florentina Jenny, MD Primary Cardiologist: [facility] Stanley Nielsen is a 81 y.o. male who is being seen today for the evaluation of syncope/falls at the request of [facility].   Chief Complaint: syncope/fall   HPI Brennin Nielsen is a 81 y.o. male seen for syncope/falls   He has severe dementia and is unable to give a history.  The chart describes prior stroke depression.  Hospitalization 4/23 reports "fall from seated position with associated head trauma status post syncopal episode " ER 10/01/21-"unresponsive. Pupils pinpoint. Bradycardic. "EMS note was reviewed.  The patient was sitting in a chair talking and then abruptly stopped.  EMS was called at 18: 32: The patient remained unresponsive until 18: 53.  His heart rate was slow at 49, treated with Narcan and atropine with increasing levels of alertness.  Blood sugar was normal. EMS note 11/04/2021-patient has been wandering the halls during the night.  Has been sitting in a chair and then when the staff looked around he was on the floor; he was alert on arrival of EMS. ER note 11/04/2021 "fall today.no LOC per report."  He is here with the activity director.  She takes him on outings frequently.  She notes that he often will stumble some after he gets up from seating and that this typically gets better over a number of minutes.  He knows his way around [place].  As noted below, his other memory short and long-term is significantly impaired   Echocardiogram demonstrated normal LV function but left ventricular hypertrophy  Event recorder was obtained data set limited.  Nocturnal pausing.  DATE TEST EF   10/18 Echo   65 % LVH mod 18/58mm  4/23 Echo   60-65 % LVH 16 mm             Date Cr K Hgb  5/23 1.2 4.0 12.3         Event Recorder personnally  reviewed :  Past Medical History:  Diagnosis Date   CKD (chronic kidney disease), stage III (HCC)    Dementia (HCC)    Depression    Gout    HOH (hard of hearing)    Hypertension    Sleep apnea    Vertigo        Surgical History:  Past Surgical History:  Procedure Laterality Date   CATARACT EXTRACTION W/PHACO Left 04/30/2015   Procedure: CATARACT EXTRACTION PHACO AND INTRAOCULAR LENS PLACEMENT (IOC);  Surgeon: Galen Manila, MD;  Location: [facility] ORS;  Service: Ophthalmology;  Laterality: Left;  [place] 00:47 AP% 23.5 CDE 11.16 fluid pack lot # [id number]   COLONOSCOPY       Current Meds  Medication Sig   acetaminophen (TYLENOL) 500 MG tablet Take 500 mg by mouth every 6 (six) hours as needed. prn   cephALEXin (KEFLEX) 500 MG capsule Take 1 capsule (500 mg total) by mouth 2 (two) times daily.   divalproex (DEPAKOTE) 250 MG DR tablet Take 1 tablet (250 mg total) by mouth every 12 (twelve) hours.   hydrALAZINE (APRESOLINE) 10 MG tablet Take 10 mg by mouth 2 (two) times daily as needed.   lisinopril (ZESTRIL) 5 MG tablet Take 1 tablet (5 mg) by mouth once daily AT BEDTIME   Multiple Vitamin (DAILY VITE PO) Take by mouth daily.   OLANZapine (ZYPREXA) 5 MG tablet Take 1  tablet (5 mg total) by mouth daily as needed (agitation).   oxymetazoline (AFRIN) 0.05 % nasal spray Place 1 spray into both nostrils 2 (two) times daily as needed for congestion.   sennosides-docusate sodium (SENOKOT-S) 8.6-50 MG tablet Take 2 tablets by mouth daily.   tamsulosin (FLOMAX) 0.4 MG CAPS capsule Take 0.4 mg by mouth daily.   traZODone (DESYREL) 50 MG tablet Take 50 mg by mouth at bedtime.       Allergies:  Allergies  Allergen Reactions   Penicillins Hives    Has patient had a PCN reaction causing immediate rash, facial/tongue/throat swelling, SOB or lightheadedness with hypotension: No Has patient had a PCN reaction causing severe rash involving mucus membranes or skin necrosis: No Has patient had a  PCN reaction that required hospitalization: No Has patient had a PCN reaction occurring within the last 10 years: No If all of the above answers are "NO", then may proceed with Cephalosporin use.   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     All other systems reviewed and negative.    Physical Exam:  Blood pressure (!) 163/74, pulse (!) 57, height 5\' 7"  (1.702 m), weight 177 lb 2 oz (80.3 kg), SpO2 96 %. General: Well developed, well nourished male in no acute distress. Head: Normocephalic, atraumatic, sclera non-icteric, no xanthomas, nares are without discharge. EENT: normal Lymph Nodes:  non Back: without scoliosis/kyphosis, no CVA tendersness Neck: Negative for carotid bruits. JVD not elevated. Lungs: Clear bilaterally to auscultation without wheezes, rales,  or rhonchi. Breathing is unlabored. Heart: RRR with S1 S2. No murmur , rubs, or gallops appreciated. Abdomen: Soft, non-tender, non-distended with normoactive bowel sounds. No hepatomegaly. No rebound/guarding. No obvious abdominal masses. Msk:  Strength and tone appear normal for age. Extremities: No clubbing or cyanosis. No + edema.  Distal pedal pulses are 2+ and equal bilaterally. Skin: Warm and Dry Neuro: Alert and oriented X 1. CN III-XII intact Grossly normal sensory and motor function . Psych:  Responds to questions appropriately with a normal affect. The patient is uncertain if he has children initially uncertain of their  names.     Labs: Cardiac Enzymes No results for input(s): "CKTOTAL", "CKMB", "TROPONINI" in the last 72 hours. CBC Lab Results  Component Value Date   WBC 5.6 11/08/2021   HGB 12.3 (L) 11/08/2021   HCT 39.2 11/08/2021   MCV 90.3 11/08/2021   PLT 182 11/08/2021   PROTIME: No results for input(s): "LABPROT", "INR" in the last 72 hours. Chemistry No results for input(s): "NA", "K", "CL", "CO2", "BUN", "CREATININE", "CALCIUM", "PROT", "BILITOT", "ALKPHOS", "ALT", "AST", "GLUCOSE" in the last 168 hours.  Invalid input(s): "LABALBU" Lipids Lab Results  Component Value Date   CHOL 169 06/08/2021   HDL 34 (L) 06/08/2021   LDLCALC 113 (H) 06/08/2021   TRIG 112 06/08/2021   BNP No results found for: "PROBNP" Thyroid Function Tests: No results for input(s): "TSH", "T4TOTAL", "T3FREE", "THYROIDAB" in the last 72 hours.  Invalid input(s): "FREET3"         EKG: Sinus at 57 Interval 16/08/46 PACs Nonspecific ST-T changes Borderline QT prolongation  7/23  sinus with narrow QRS and nonspecific stt wave changes  Assessment and Plan:  Syncope/falls  LVH hypertrophy mod-severe  QT prolongation-borderline  Bradycardia--variable-chronic  Hypertension with orthostatic hypotension without heart rate response suggestive of autonomic  failure  Nocturnal pausing  Dementia-severe   First, is not clear what is happening with these events.  On some he has had profound protracted unresponsiveness, this is inconsistent with syncope.  His pinpoint pupils also suggest another mechanism. Other episodes have been described as falls without loss of consciousness.  The most objective information that may be helpful I think is his orthostatic hypotension unassociated with a change in heart rate which suggests incipient autonomic failure.  Consistent with this is the observation by the activity director that stumbling after getting up from a chair happens not infrequently and then gradually improves.  He complains of a dry mouth, but his ability even to describe current situations is not probably very credible; he denies dry eyes.  Many of these patients will have constipation he is unable to give such a history.  His QT prolongation is is present but is borderline; however, protracted unresponsiveness is inconsistent with torsade de pointes.  Interestingly, he does make mention of an episode where he passed out at the beach but he also reports that he recently returned from [place].  He has significant hypertension; however, given his orthostatic hypotension I am reluctant to be more aggressive with his antihypertensive therapies although we will change his lisinopril from a.m. to p.m. to try to protect him from supine hypertension while he sleeps to the degree possible.  I have suggested that we try an abdominal binder to try to offset the orthostatic hypotension.  Interestingly, he has had some quite low blood pressures recorded as well consistent with the aforementioned observations.  As has been noted below     He has left ventricular hypertrophy that exceeds what is usually seen with hypertension.  But as can be seen above he has had significant systolic hypertension.  His daughters live out of the country but if they are involved in  his care, would recommend that they be looked at to exclude hypertrophic cardiomyopathy; in this regard notably his ECG is benign  Sherryl Manges

## 2022-03-05 ENCOUNTER — Ambulatory Visit: Payer: Medicare Other | Admitting: Podiatry

## 2022-04-03 ENCOUNTER — Encounter: Payer: Self-pay | Admitting: Emergency Medicine

## 2022-04-03 ENCOUNTER — Emergency Department: Payer: No Typology Code available for payment source

## 2022-04-03 ENCOUNTER — Other Ambulatory Visit: Payer: Self-pay

## 2022-04-03 ENCOUNTER — Emergency Department
Admission: EM | Admit: 2022-04-03 | Discharge: 2022-04-03 | Disposition: A | Discharge status: Skilled Nursing Facility | Payer: No Typology Code available for payment source | Attending: Student in an Organized Health Care Education/Training Program | Admitting: Student in an Organized Health Care Education/Training Program

## 2022-04-03 DIAGNOSIS — Z043 Encounter for examination and observation following other accident: Secondary | ICD-10-CM | POA: Diagnosis present

## 2022-04-03 DIAGNOSIS — R519 Headache, unspecified: Secondary | ICD-10-CM | POA: Diagnosis not present

## 2022-04-03 DIAGNOSIS — W19XXXA Unspecified fall, initial encounter: Secondary | ICD-10-CM | POA: Insufficient documentation

## 2022-04-03 NOTE — ED Notes (Signed)
[facility] called spoke with Maudie Mercury.  Report given.

## 2022-04-03 NOTE — ED Provider Triage Note (Signed)
Emergency Medicine Provider Triage Evaluation Note  Stanley Nielsen , a 81 y.o. male  was evaluated in triage.  Pt complains of unwitnessed fall, loc per [facility], has been ambulatory since fall.  Review of Systems  Positive:  Negative:   Physical Exam  Ht 5\' 7"  (1.702 m)   Wt 80.3 kg   BMI 27.73 kg/m  Gen:   Awake, no distress   Resp:  Normal effort  MSK:   Moves extremities without difficulty  Other:    Medical Decision Making  Medically screening exam initiated at 7:21 AM.  Appropriate orders placed.  Stanley Nielsen was informed that the remainder of the evaluation will be completed by another provider, this initial triage assessment does not replace that evaluation, and the importance of remaining in the ED until their evaluation is complete.  Ct head and c spine, if negative will d/c from triage if able to bw   Versie Starks, PA-C 04/03/22 [phone]

## 2022-04-03 NOTE — ED Triage Notes (Signed)
Patient arrived by EMS from [facility]. Staff reports LOC. Unwitnessed fall to ground while walking. EMS reports upon arrival patient was alert and ambulatory with NAD noted. HX dementia  EMS vitals: 119 blood sugar 803O systolic 12YQ 825% RA

## 2022-04-03 NOTE — ED Notes (Signed)
Stood patient and ambulated with assist.  Tolerated well.

## 2022-04-03 NOTE — Discharge Instructions (Signed)
Ct head and cspine are negative for any acute abnormality

## 2022-04-03 NOTE — ED Notes (Signed)
Called [facility] for transport to [facility]

## 2022-04-03 NOTE — ED Provider Notes (Signed)
   [facility] Provider Note    Event Date/Time   First MD Initiated Contact with Patient 04/03/22 [phone]     (approximate)   History   No chief complaint on file.   HPI  Stanley Nielsen is a 81 y.o. male presents emergency department after an unwitnessed fall at [facility].  Concerns for hitting his head.  Patient denies any other areas of pain.  He is able to ambulate.      Physical Exam   Triage Vital Signs: ED Triage Vitals  Enc Vitals Group     BP 04/03/22 0722 129/75     Pulse Rate 04/03/22 0722 (!) 55     Resp 04/03/22 0722 16     Temp 04/03/22 0722 98.3 F (36.8 C)     Temp Source 04/03/22 0722 Oral     SpO2 04/03/22 0722 100 %     Weight 04/03/22 0718 177 lb 0.5 oz (80.3 kg)     Height 04/03/22 0718 5\' 7"  (1.702 m)     Head Circumference --      Peak Flow --      Pain Score --      Pain Loc --      Pain Edu? --      Excl. in [place]? --     Most recent vital signs: Vitals:   04/03/22 0722  BP: 129/75  Pulse: (!) 55  Resp: 16  Temp: 98.3 F (36.8 C)  SpO2: 100%     General: Awake, no distress.   CV:  Good peripheral perfusion. regular rate and  rhythm Resp:  Normal effort. Lungs CTA Abd:  No distention.   Other:  Patient is able to stand and walk without difficulty   ED Results / Procedures / Treatments   Labs (all labs ordered are listed, but only abnormal results are displayed) Labs Reviewed - No data to display   EKG     RADIOLOGY CT of the head and C-spine    PROCEDURES:   Procedures   MEDICATIONS ORDERED IN ED: Medications - No data to display   IMPRESSION / MDM / [place] / ED COURSE  I reviewed the triage vital signs and the nursing notes.                              Differential diagnosis includes, but is not limited to, subdural, skull fracture, C-spine fracture  Patient's presentation is most consistent with acute presentation with potential threat to life or bodily  function.   CT of the head and cervical spine independently reviewed and interpreted by me as being negative.  Did explain findings to the patient.  He will be sent back to [facility].  He is in stable condition.  We did have him ambulate in triage and he was able to stand with assistance and walk across the floor.      FINAL CLINICAL IMPRESSION(S) / ED DIAGNOSES   Final diagnoses:  Fall, initial encounter     Rx / DC Orders   ED Discharge Orders     None        Note:  This document was prepared using Dragon voice recognition software and may include unintentional dictation errors.    Versie Starks, PA-C 04/03/22 [phone]    Merlyn Lot, MD 04/03/22 [phone]

## 2022-05-26 ENCOUNTER — Emergency Department: Payer: No Typology Code available for payment source

## 2022-05-26 ENCOUNTER — Emergency Department
Admission: EM | Admit: 2022-05-26 | Discharge: 2022-05-26 | Disposition: A | Discharge status: Skilled Nursing Facility | Payer: No Typology Code available for payment source | Attending: Emergency Medicine | Admitting: Emergency Medicine

## 2022-05-26 ENCOUNTER — Other Ambulatory Visit: Payer: Self-pay

## 2022-05-26 ENCOUNTER — Encounter: Payer: Self-pay | Admitting: Emergency Medicine

## 2022-05-26 DIAGNOSIS — F0390 Unspecified dementia without behavioral disturbance: Secondary | ICD-10-CM | POA: Insufficient documentation

## 2022-05-26 DIAGNOSIS — R079 Chest pain, unspecified: Secondary | ICD-10-CM | POA: Diagnosis present

## 2022-05-26 DIAGNOSIS — R0789 Other chest pain: Secondary | ICD-10-CM | POA: Diagnosis not present

## 2022-05-26 LAB — URINALYSIS, ROUTINE W REFLEX MICROSCOPIC
Bilirubin Urine: NEGATIVE
Glucose, UA: NEGATIVE mg/dL
Hgb urine dipstick: NEGATIVE
Ketones, ur: 20 mg/dL — AB
Leukocytes,Ua: NEGATIVE
Nitrite: NEGATIVE
Protein, ur: NEGATIVE mg/dL
Specific Gravity, Urine: 1.015 (ref 1.005–1.030)
pH: 5 (ref 5.0–8.0)

## 2022-05-26 LAB — BASIC METABOLIC PANEL
Anion gap: 7 (ref 5–15)
BUN: 26 mg/dL — ABNORMAL HIGH (ref 8–23)
CO2: 27 mmol/L (ref 22–32)
Calcium: 8.6 mg/dL — ABNORMAL LOW (ref 8.9–10.3)
Chloride: 108 mmol/L (ref 98–111)
Creatinine, Ser: 1.07 mg/dL (ref 0.61–1.24)
GFR, Estimated: >60 mL/min (ref >60)
Glucose, Bld: 111 mg/dL — ABNORMAL HIGH (ref 70–99)
Potassium: 3.6 mmol/L (ref 3.5–5.1)
Sodium: 142 mmol/L (ref 135–145)

## 2022-05-26 LAB — CBC
HCT: 39.1 % (ref 39.0–52.0)
Hemoglobin: 12.8 g/dL — ABNORMAL LOW (ref 13.0–17.0)
MCH: 29.5 pg (ref 26.0–34.0)
MCHC: 32.7 g/dL (ref 30.0–36.0)
MCV: 90.1 fL (ref 80.0–100.0)
Platelets: 134 10*3/uL — ABNORMAL LOW (ref 150–400)
RBC: 4.34 MIL/uL (ref 4.22–5.81)
RDW: 14.5 % (ref 11.5–15.5)
WBC: 4.2 10*3/uL (ref 4.0–10.5)
nRBC: 0 % (ref 0.0–0.2)

## 2022-05-26 LAB — HEPATIC FUNCTION PANEL
ALT: 17 U/L (ref 0–44)
AST: 43 U/L — ABNORMAL HIGH (ref 15–41)
Albumin: 3.2 g/dL — ABNORMAL LOW (ref 3.5–5.0)
Alkaline Phosphatase: 63 U/L (ref 38–126)
Bilirubin, Direct: 0.2 mg/dL (ref 0.0–0.2)
Indirect Bilirubin: 0.6 mg/dL (ref 0.3–0.9)
Total Bilirubin: 0.8 mg/dL (ref 0.3–1.2)
Total Protein: 6.9 g/dL (ref 6.5–8.1)

## 2022-05-26 LAB — TROPONIN I (HIGH SENSITIVITY)
Troponin I (High Sensitivity): 14 ng/L (ref <18)
Troponin I (High Sensitivity): 12 ng/L (ref <18)

## 2022-05-26 LAB — LIPASE, BLOOD: Lipase: 35 U/L (ref 11–51)

## 2022-05-26 NOTE — ED Triage Notes (Signed)
Per EMS pt coming from [facility]- hx of dementia. Fire was on scene first and states pt was standing against wall and slid down. Did not hit head. No LOC. Then started c/o stomach and chest pain. Given 324 aspirin.

## 2022-05-26 NOTE — ED Notes (Signed)
Report given to nurse Joyce Gross of [facility] at this time. No questions comments or concerns after report was given.

## 2022-05-26 NOTE — ED Notes (Signed)
This nurse went to pt's room due pt newly arrived to this pt's section. Pt denies any CP and no s/s of distress.

## 2022-05-26 NOTE — ED Provider Triage Note (Signed)
Emergency Medicine Provider Triage Evaluation Note  Stanley Nielsen , a 81 y.o. male  was evaluated in triage.  Pt presents via EMS from [facility].  Patient was noted to be standing leaning against the wall when he slid to the floor and was complaining of chest pain.  Patient has severe dementia.  Is not complaining of anything at this time.  Review of Systems  Positive:  Negative:   Physical Exam  BP (!) 118/56   Pulse (!) 50   Temp 97.7 F (36.5 C) (Oral)   Resp 18   SpO2 100%  Gen:   Awake, no distress   Resp:  Normal effort  MSK:   Moves extremities without difficulty  Other:    Medical Decision Making  Medically screening exam initiated at 8:26 AM.  Appropriate orders placed.  Stanley Nielsen was informed that the remainder of the evaluation will be completed by another provider, this initial triage assessment does not replace that evaluation, and the importance of remaining in the ED until their evaluation is complete.     Faythe Ghee, PA-C 05/26/22 [phone]

## 2022-05-26 NOTE — ED Notes (Signed)
Waiting on [facility] for ride back to [facility].

## 2022-05-26 NOTE — ED Notes (Signed)
Patient transferred to [facility] via EMS at this time

## 2022-05-26 NOTE — ED Notes (Signed)
Pt was given a urinal and was notified about needing a urine sample. Pt verbal stated he understood and will try.

## 2022-05-26 NOTE — ED Notes (Signed)
Pt is d/c at this time. Waiting for a ambulance ride to [facility]. Pt was moved to a hallway bed due to waiting for a ride so pt is not d/c to the

## 2022-05-26 NOTE — ED Notes (Signed)
Pt ripped out his IV and all monitor stickers off and was wondering in the halls. Pt told a CT Tech 'I need to talk to someone in management' when this nurse was walking out another pt's room. Pt was guided back into his room and placed back into bed. Pt was cooperative and is now in bed.

## 2022-05-26 NOTE — ED Notes (Signed)
Waiting on ride.

## 2022-05-26 NOTE — ED Provider Notes (Signed)
[facility] Provider Note    Event Date/Time   First MD Initiated Contact with Patient 05/26/22 0945     (approximate)   History   Chest Pain   HPI  Stanley Nielsen is a 81 y.o. male who comes in from [facility].  Patient has a history of dementia.  Patient was standing against a wall and slid down did not hit his head no LOC.  There was concern for chest pain.  Patient was given 324 of aspirin.  Patient is alert and oriented x 2.  He states that he is not sure why he is here.  I will call his facility just to ensure this is the story.  Discussed the staff at [facility] house.  The person who witnessed the event is adamant that he did not fall down onto the ground did not hit his head He was walking alongside the wall and slid down and complained of some chest pain, pain in side but not sure what side or his abdomen. This was witnessed- did not fall and hit his head. AO x2.  His baseline is dementia.  I asked if that he does not know what happened is very normal for him.  Acting normal self this morning.  Stanley Nielsen-[phone] helps make medical decisions given his daughter lives out of the country.   Physical Exam   Triage Vital Signs: ED Triage Vitals  Enc Vitals Group     BP 05/26/22 0825 (!) 118/56     Pulse Rate 05/26/22 0824 (!) 50     Resp 05/26/22 0824 18     Temp 05/26/22 0825 97.7 F (36.5 C)     Temp Source 05/26/22 0825 Oral     SpO2 05/26/22 0821 100 %     Weight --      Height --      Head Circumference --      Peak Flow --      Pain Score --      Pain Loc --      Pain Edu? --      Excl. in [place]? --     Most recent vital signs: Vitals:   05/26/22 0824 05/26/22 0825  BP:  (!) 118/56  Pulse: (!) 50   Resp: 18   Temp:  97.7 F (36.5 C)  SpO2:       General: Awake, no distress.  CV:  Good peripheral perfusion.  Resp:  Normal effort.  Abd:  No distention.  Other:  CN nerves II to XII appear intact.  Equal  strength in arms and legs.  Able to lift both legs up off the bed.  Abdomen appears soft nontender.  No hematoma noted on the head.   ED Results / Procedures / Treatments   Labs (all labs ordered are listed, but only abnormal results are displayed) Labs Reviewed  BASIC METABOLIC PANEL - Abnormal; Notable for the following components:      Result Value   Glucose, Bld 111 (*)    BUN 26 (*)    Calcium 8.6 (*)    All other components within normal limits  CBC - Abnormal; Notable for the following components:   Hemoglobin 12.8 (*)    Platelets 134 (*)    All other components within normal limits  HEPATIC FUNCTION PANEL  LIPASE, BLOOD  URINALYSIS, ROUTINE W REFLEX MICROSCOPIC  TROPONIN I (HIGH SENSITIVITY)     EKG  My interpretation of EKG:  Sinus bradycardia  rate of 54 without any ST elevation or T wave inversion possible PVC the versus PAC difficult to tell we will try to get a repeat.  Grossly normal intervals  Repeat EKG is sinus rate of 52 without any ST elevation or T wave inversion.  Occasional PAC  RADIOLOGY I have reviewed the xray personally and interpreted and clear lungs no pneumonia   PROCEDURES:  Critical Care performed: No  .1-3 Lead EKG Interpretation  Performed by: Concha Se, MD Authorized by: Concha Se, MD     Interpretation: abnormal     ECG rate:  50   ECG rate assessment: bradycardic     Rhythm: sinus bradycardia     Ectopy: none     Conduction: normal      MEDICATIONS ORDERED IN ED: Medications - No data to display   IMPRESSION / MDM / ASSESSMENT AND PLAN / ED COURSE  I reviewed the triage vital signs and the nursing notes.   Patient's presentation is most consistent with acute presentation with potential threat to life or bodily function.   Patient comes in with an episode of near fall but there was some question of possible chest discomfort.  Patient is slightly bradycardic but sinus in nature and patient has had sinus heart  rates previously especially back in August 2023 when he was seen by cardiology.  BMP shows stable creatinine.  CBC shows hemoglobin around baseline.  Initial troponin is negative.  Repeat troponin is negative.  Liver test are negative.  AST slightly elevated.  12:05 PM re-evaluated pt- abdomen soft and non tender.   Repeat troponin is negative.  Patient been up and ambulatory with walker.  I have again called the facility and they again expressed that patient was just sent here in order to make sure that there was not any heart problems given he had reported some chest pain.  On repeat evaluation patient continues denies any chest pain or shortness of breath and remains asymptomatic.  He is tolerated p.o. and he is comfortable with discharge home.  I did call Lindaann Pascal to update him.  Difficulty updating POA given that she lives in [place].  The patient is on the cardiac monitor to evaluate for evidence of arrhythmia and/or significant heart rate changes.      FINAL CLINICAL IMPRESSION(S) / ED DIAGNOSES   Final diagnoses:  Atypical chest pain     Rx / DC Orders   ED Discharge Orders     None        Note:  This document was prepared using Dragon voice recognition software and may include unintentional dictation errors.   Concha Se, MD 05/26/22 [phone]

## 2022-05-26 NOTE — Discharge Instructions (Signed)
His workup was reassuring without any signs of any abdominal pain or chest pain.  His cardiac markers were negative for heart attack no signs of UTI.  He was able to get up and walk around with a walker therefore patient was discharged back to facility.

## 2022-07-29 ENCOUNTER — Other Ambulatory Visit: Payer: Self-pay

## 2022-07-29 ENCOUNTER — Emergency Department
Admission: EM | Admit: 2022-07-29 | Discharge: 2022-07-29 | Disposition: A | Discharge status: Home / Self Care | Payer: No Typology Code available for payment source | Attending: Emergency Medicine | Admitting: Emergency Medicine

## 2022-07-29 ENCOUNTER — Emergency Department: Payer: No Typology Code available for payment source

## 2022-07-29 DIAGNOSIS — E1122 Type 2 diabetes mellitus with diabetic chronic kidney disease: Secondary | ICD-10-CM | POA: Diagnosis not present

## 2022-07-29 DIAGNOSIS — U071 COVID-19: Secondary | ICD-10-CM | POA: Diagnosis not present

## 2022-07-29 DIAGNOSIS — R531 Weakness: Secondary | ICD-10-CM | POA: Diagnosis present

## 2022-07-29 DIAGNOSIS — F0390 Unspecified dementia without behavioral disturbance: Secondary | ICD-10-CM | POA: Insufficient documentation

## 2022-07-29 DIAGNOSIS — I129 Hypertensive chronic kidney disease with stage 1 through stage 4 chronic kidney disease, or unspecified chronic kidney disease: Secondary | ICD-10-CM | POA: Insufficient documentation

## 2022-07-29 DIAGNOSIS — N189 Chronic kidney disease, unspecified: Secondary | ICD-10-CM | POA: Insufficient documentation

## 2022-07-29 LAB — RESP PANEL BY RT-PCR (RSV, FLU A&B, COVID)  RVPGX2
Influenza A by PCR: NEGATIVE
Influenza B by PCR: NEGATIVE
Resp Syncytial Virus by PCR: NEGATIVE
SARS Coronavirus 2 by RT PCR: POSITIVE — AB

## 2022-07-29 LAB — URINALYSIS, ROUTINE W REFLEX MICROSCOPIC
Bacteria, UA: NONE SEEN
Bilirubin Urine: NEGATIVE
Glucose, UA: NEGATIVE mg/dL
Hgb urine dipstick: NEGATIVE
Ketones, ur: 5 mg/dL — AB
Nitrite: NEGATIVE
Protein, ur: 30 mg/dL — AB
Specific Gravity, Urine: 1.018 (ref 1.005–1.030)
pH: 6 (ref 5.0–8.0)

## 2022-07-29 LAB — COMPREHENSIVE METABOLIC PANEL
ALT: 16 U/L (ref 0–44)
AST: 31 U/L (ref 15–41)
Albumin: 3.4 g/dL — ABNORMAL LOW (ref 3.5–5.0)
Alkaline Phosphatase: 65 U/L (ref 38–126)
Anion gap: 8 (ref 5–15)
BUN: 20 mg/dL (ref 8–23)
CO2: 26 mmol/L (ref 22–32)
Calcium: 8.5 mg/dL — ABNORMAL LOW (ref 8.9–10.3)
Chloride: 103 mmol/L (ref 98–111)
Creatinine, Ser: 1.17 mg/dL (ref 0.61–1.24)
GFR, Estimated: >60 mL/min (ref >60)
Glucose, Bld: 108 mg/dL — ABNORMAL HIGH (ref 70–99)
Potassium: 4 mmol/L (ref 3.5–5.1)
Sodium: 137 mmol/L (ref 135–145)
Total Bilirubin: 0.8 mg/dL (ref 0.3–1.2)
Total Protein: 7.1 g/dL (ref 6.5–8.1)

## 2022-07-29 LAB — CBC
HCT: 38.5 % — ABNORMAL LOW (ref 39.0–52.0)
Hemoglobin: 12.6 g/dL — ABNORMAL LOW (ref 13.0–17.0)
MCH: 30.4 pg (ref 26.0–34.0)
MCHC: 32.7 g/dL (ref 30.0–36.0)
MCV: 92.8 fL (ref 80.0–100.0)
Platelets: 122 10*3/uL — ABNORMAL LOW (ref 150–400)
RBC: 4.15 MIL/uL — ABNORMAL LOW (ref 4.22–5.81)
RDW: 14.2 % (ref 11.5–15.5)
WBC: 4.5 10*3/uL (ref 4.0–10.5)
nRBC: 0 % (ref 0.0–0.2)

## 2022-07-29 MED ORDER — LORAZEPAM 2 MG/ML IJ SOLN
1.0000 mg | Freq: Once | INTRAMUSCULAR | Status: AC
  Administered 2022-07-29: 1 mg via INTRAMUSCULAR
  Filled 2022-07-29: qty 1

## 2022-07-29 MED ORDER — ACETAMINOPHEN 325 MG RE SUPP
650.0000 mg | Freq: Once | RECTAL | Status: AC
  Administered 2022-07-29: 650 mg via RECTAL

## 2022-07-29 MED ORDER — NIRMATRELVIR/RITONAVIR (PAXLOVID)TABLET: 3.0000 | ORAL_TABLET | Freq: Two times a day (BID) | ORAL | 0 refills | Status: AC

## 2022-07-29 MED ORDER — ACETAMINOPHEN 500 MG PO TABS
1000.0000 mg | ORAL_TABLET | Freq: Once | ORAL | Status: DC
  Filled 2022-07-29: qty 2

## 2022-07-29 MED ORDER — ACETAMINOPHEN 325 MG RE SUPP
RECTAL | Status: AC
  Filled 2022-07-29: qty 2

## 2022-07-29 MED ORDER — LACTATED RINGERS IV BOLUS: 1000.0000 mL | Freq: Once | INTRAVENOUS | Status: DC

## 2022-07-29 NOTE — ED Triage Notes (Signed)
Arrives from [facility].  Family concerned for increased tremors, increased weakness, and lethargy.  VS wnl.  Awake, and alert to baseline per report.

## 2022-07-29 NOTE — ED Provider Notes (Signed)
[facility] Provider Note    Event Date/Time   First MD Initiated Contact with Patient 07/29/22 1720     (approximate)   History   Chief Complaint Weakness   HPI  Stanley Nielsen is a 82 y.o. male with past medical history of hypertension, stroke, diabetes, CKD, and dementia who presents to the ED for weakness.  History is limited due to patient's advanced dementia and majority of history is obtained from EMS.  Patient reportedly resides at [facility] care unit and family reported concern that he has had increased tremors, weakness, and lethargy recently.  Per EMS, he is at his baseline mental status.  Patient currently denies any complaints, states the only thing bothering him is "you people."  He specifically denies any cough, chest pain, or shortness of breath.     Physical Exam   Triage Vital Signs: ED Triage Vitals  Enc Vitals Group     BP 07/29/22 1544 (!) 150/66     Pulse Rate 07/29/22 1544 69     Resp 07/29/22 1544 16     Temp 07/29/22 1544 99.8 F (37.7 C)     Temp Source 07/29/22 1544 Oral     SpO2 07/29/22 1544 98 %     Weight 07/29/22 1547 175 lb (79.4 kg)     Height 07/29/22 1547 6' (1.829 m)     Head Circumference --      Peak Flow --      Pain Score --      Pain Loc --      Pain Edu? --      Excl. in [place]? --     Most recent vital signs: Vitals:   07/29/22 1735 07/29/22 1900  BP: (!) 174/75   Pulse: 81 96  Resp: 20 18  Temp:    SpO2: 100% 98%    Constitutional: Alert and oriented to person and place, but not time or situation. Eyes: Conjunctivae are normal. Head: Atraumatic. Nose: No congestion/rhinnorhea. Mouth/Throat: Mucous membranes are moist.  Cardiovascular: Normal rate, regular rhythm. Grossly normal heart sounds.  2+ radial pulses bilaterally. Respiratory: Normal respiratory effort.  No retractions. Lungs CTAB. Gastrointestinal: Soft and nontender. No distention. Musculoskeletal: No lower  extremity tenderness nor edema.  Neurologic:  Normal speech and language. No gross focal neurologic deficits are appreciated.    ED Results / Procedures / Treatments   Labs (all labs ordered are listed, but only abnormal results are displayed) Labs Reviewed  RESP PANEL BY RT-PCR (RSV, FLU A&B, COVID)  RVPGX2 - Abnormal; Notable for the following components:      Result Value   SARS Coronavirus 2 by RT PCR POSITIVE (*)    All other components within normal limits  CBC - Abnormal; Notable for the following components:   RBC 4.15 (*)    Hemoglobin 12.6 (*)    HCT 38.5 (*)    Platelets 122 (*)    All other components within normal limits  COMPREHENSIVE METABOLIC PANEL - Abnormal; Notable for the following components:   Glucose, Bld 108 (*)    Calcium 8.5 (*)    Albumin 3.4 (*)    All other components within normal limits  URINALYSIS, ROUTINE W REFLEX MICROSCOPIC - Abnormal; Notable for the following components:   Color, Urine YELLOW (*)    APPearance HAZY (*)    Ketones, ur 5 (*)    Protein, ur 30 (*)    Leukocytes,Ua SMALL (*)    All  other components within normal limits  URINE CULTURE     EKG  ED ECG REPORT I, Blake Divine, the attending physician, personally viewed and interpreted this ECG.   Date: 07/29/2022  EKG Time: 15:50  Rate: 67  Rhythm: normal sinus rhythm  Axis: Normal  Intervals:none  ST&T Change: None  RADIOLOGY Chest x-ray reviewed and interpreted by me with no infiltrate, edema, or effusion.  PROCEDURES:  Critical Care performed: No  Procedures   MEDICATIONS ORDERED IN ED: Medications  acetaminophen (TYLENOL) suppository 650 mg (650 mg Rectal Given 07/29/22 1738)     IMPRESSION / MDM / ASSESSMENT AND PLAN / ED COURSE  I reviewed the triage vital signs and the nursing notes.                              82 y.o. male with past medical history of hypertension, diabetes, stroke, CKD, and dementia who presents to the ED due to concern for  increased weakness, tremor, and lethargy.  Patient's presentation is most consistent with acute presentation with potential threat to life or bodily function.  Differential diagnosis includes, but is not limited to, intracranial process, stroke, electrolyte abnormality, AKI, pneumonia, UTI, anemia, viral illness.  Patient nontoxic-appearing and in no acute distress, vital signs are unremarkable.  He appears to be oriented to his baseline mental status with no focal neurologic deficits on exam.  Labs are reassuring with no significant anemia, leukocytosis, stridor abnormality, or AKI.  Patient did test positive for COVID-19, which is likely the source of his symptoms.  He is not in any respiratory distress and maintaining oxygen saturations at 98% on room air.  We will check chest x-ray and urinalysis to ensure no bacterial infection, but if further testing is unremarkable patient be appropriate for discharge back to his nursing facility.  I attempted to contact family for an update, however they did not answer the phone.  Chest x-ray is unremarkable, urinalysis shows some WBCs but otherwise no findings concerning for UTI.  Suspect patient's weakness is secondary to COVID-19, but we will send urine for culture.  He is appropriate for discharge back to nursing facility and we will start him on Paxlovid.  I have attempted to reach family multiple times via phone but have been unsuccessful.      FINAL CLINICAL IMPRESSION(S) / ED DIAGNOSES   Final diagnoses:  Generalized weakness  COVID-19     Rx / DC Orders   ED Discharge Orders          Ordered    nirmatrelvir/ritonavir (PAXLOVID) 20 x 150 MG & 10 x 100MG  TABS  2 times daily        07/29/22 1935             Note:  This document was prepared using Dragon voice recognition software and may include unintentional dictation errors.   Blake Divine, MD 07/29/22 [phone]

## 2022-08-01 LAB — URINE CULTURE: Culture: 10000 — AB

## 2022-08-02 NOTE — Progress Notes (Signed)
ED Antimicrobial Stewardship Positive Culture Follow Up   Stanley Nielsen is an 82 y.o. male who presented to [facility] on 07/29/2022 with a chief complaint of  Chief Complaint  Patient presents with   Weakness    Recent Results (from the past 720 hour(s))  Resp panel by RT-PCR (RSV, Flu A&B, Covid) Anterior Nasal Swab     Status: Abnormal   Collection Time: 07/29/22  3:49 PM   Specimen: Anterior Nasal Swab  Result Value Ref Range Status   SARS Coronavirus 2 by RT PCR POSITIVE (A) NEGATIVE Final    Comment: (NOTE) SARS-CoV-2 target nucleic acids are DETECTED.  The SARS-CoV-2 RNA is generally detectable in upper respiratory specimens during the acute phase of infection. Positive results are indicative of the presence of the identified virus, but do not rule out bacterial infection or co-infection with other pathogens not detected by the test. Clinical correlation with patient history and other diagnostic information is necessary to determine patient infection status. The expected result is Negative.  Fact Sheet for Patients: EntrepreneurPulse.com.au  Fact Sheet for Healthcare Providers: IncredibleEmployment.be  This test is not yet approved or cleared by the [place] FDA and  has been authorized for detection and/or diagnosis of SARS-CoV-2 by FDA under an Emergency Use Authorization (EUA).  This EUA will remain in effect (meaning this test can be used) for the duration of  the COVID-19 declaration under Section 564(b)(1) of the A ct, 21 U.S.C. section 360bbb-3(b)(1), unless the authorization is terminated or revoked sooner.     Influenza A by PCR NEGATIVE NEGATIVE Final   Influenza B by PCR NEGATIVE NEGATIVE Final    Comment: (NOTE) The Xpert Xpress SARS-CoV-2/FLU/RSV plus assay is intended as an aid in the diagnosis of influenza from Nasopharyngeal swab specimens and should not be used as a sole basis for treatment. Nasal  washings and aspirates are unacceptable for Xpert Xpress SARS-CoV-2/FLU/RSV testing.  Fact Sheet for Patients: EntrepreneurPulse.com.au  Fact Sheet for Healthcare Providers: IncredibleEmployment.be  This test is not yet approved or cleared by the [place] FDA and has been authorized for detection and/or diagnosis of SARS-CoV-2 by FDA under an Emergency Use Authorization (EUA). This EUA will remain in effect (meaning this test can be used) for the duration of the COVID-19 declaration under Section 564(b)(1) of the Act, 21 U.S.C. section 360bbb-3(b)(1), unless the authorization is terminated or revoked.     Resp Syncytial Virus by PCR NEGATIVE NEGATIVE Final    Comment: (NOTE) Fact Sheet for Patients: EntrepreneurPulse.com.au  Fact Sheet for Healthcare Providers: IncredibleEmployment.be  This test is not yet approved or cleared by the [place] FDA and has been authorized for detection and/or diagnosis of SARS-CoV-2 by FDA under an Emergency Use Authorization (EUA). This EUA will remain in effect (meaning this test can be used) for the duration of the COVID-19 declaration under Section 564(b)(1) of the Act, 21 U.S.C. section 360bbb-3(b)(1), unless the authorization is terminated or revoked.  Performed at [facility], [address], [place], [place] [address]   Urine Culture (for pregnant, neutropenic or urologic patients or patients with an indwelling urinary catheter)     Status: Abnormal   Collection Time: 07/29/22  6:44 PM   Specimen: Urine, Clean Catch  Result Value Ref Range Status   Specimen Description URINE, CLEAN CATCH  Final   Special Requests NONE  Final   Culture 10,000 COLONIES/mL PROTEUS MIRABILIS (A)  Final   Report Status 08/01/2022 FINAL  Final  Organism ID, Bacteria PROTEUS MIRABILIS (A)  Final      Susceptibility   Proteus mirabilis - MIC*    AMPICILLIN <=2  SENSITIVE Sensitive     CEFEPIME <=0.12 SENSITIVE Sensitive     CEFTRIAXONE <=0.25 SENSITIVE Sensitive     CIPROFLOXACIN <=0.25 SENSITIVE Sensitive     GENTAMICIN <=1 SENSITIVE Sensitive     IMIPENEM 2 SENSITIVE Sensitive     NITROFURANTOIN 128 RESISTANT Resistant     TRIMETH/SULFA <=20 SENSITIVE Sensitive     AMPICILLIN/SULBACTAM <=2 SENSITIVE Sensitive     PIP/TAZO Value in next row Sensitive      <=4 SENSITIVEPerformed at [facility] [address], [place], [place] [address]    * 10,000 COLONIES/mL PROTEUS MIRABILIS    [x]$  Patient discharged originally without antimicrobial agent  New antibiotic prescription: None  82 yo M with PMH dementia presented with CC weakness and found to be COVID(+), believed to be responsible for presenting symptoms. Mental status was at baseline while here and no urinary symptoms and patient discharged back to facility without antibiotics. Ucx results today growing 10,000 CFU Proteus mirabilis. Given lack of urinary symptoms and presented likely due to COVID, pursuing treatment of UTI not appropriate.  ED Provider: Carrie Mew  Will M. Ouida Sills, PharmD PGY-1 Pharmacy Resident 08/02/2022 2:45 PM

## 2022-08-27 ENCOUNTER — Ambulatory Visit: Payer: Medicare Other | Admitting: Internal Medicine

## 2022-09-03 ENCOUNTER — Encounter: Payer: Self-pay | Admitting: Internal Medicine

## 2022-09-03 ENCOUNTER — Ambulatory Visit: Payer: Medicare Other | Attending: Internal Medicine | Admitting: Internal Medicine

## 2022-09-03 DIAGNOSIS — I951 Orthostatic hypotension: Secondary | ICD-10-CM | POA: Insufficient documentation

## 2022-09-03 DIAGNOSIS — I517 Cardiomegaly: Secondary | ICD-10-CM | POA: Insufficient documentation

## 2022-10-12 ENCOUNTER — Other Ambulatory Visit: Payer: Self-pay

## 2022-10-12 ENCOUNTER — Emergency Department: Payer: No Typology Code available for payment source

## 2022-10-12 ENCOUNTER — Inpatient Hospital Stay: Payer: No Typology Code available for payment source

## 2022-10-12 ENCOUNTER — Inpatient Hospital Stay
Admission: EM | Admit: 2022-10-12 | Discharge: 2022-10-16 | DRG: 281 | Disposition: A | Discharge status: Nursing Home (Includes ICF) | Payer: No Typology Code available for payment source | Source: Skilled Nursing Facility | Attending: Internal Medicine | Admitting: Internal Medicine

## 2022-10-12 DIAGNOSIS — F02C11 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, severe, with agitation: Secondary | ICD-10-CM | POA: Diagnosis present

## 2022-10-12 DIAGNOSIS — I129 Hypertensive chronic kidney disease with stage 1 through stage 4 chronic kidney disease, or unspecified chronic kidney disease: Secondary | ICD-10-CM | POA: Diagnosis present

## 2022-10-12 DIAGNOSIS — E119 Type 2 diabetes mellitus without complications: Secondary | ICD-10-CM

## 2022-10-12 DIAGNOSIS — I700 Atherosclerosis of aorta: Secondary | ICD-10-CM | POA: Diagnosis present

## 2022-10-12 DIAGNOSIS — Z9842 Cataract extraction status, left eye: Secondary | ICD-10-CM | POA: Diagnosis not present

## 2022-10-12 DIAGNOSIS — F03918 Unspecified dementia, unspecified severity, with other behavioral disturbance: Secondary | ICD-10-CM | POA: Diagnosis not present

## 2022-10-12 DIAGNOSIS — J189 Pneumonia, unspecified organism: Secondary | ICD-10-CM

## 2022-10-12 DIAGNOSIS — N400 Enlarged prostate without lower urinary tract symptoms: Secondary | ICD-10-CM | POA: Diagnosis present

## 2022-10-12 DIAGNOSIS — M79672 Pain in left foot: Secondary | ICD-10-CM | POA: Diagnosis present

## 2022-10-12 DIAGNOSIS — R9431 Abnormal electrocardiogram [ECG] [EKG]: Secondary | ICD-10-CM | POA: Diagnosis present

## 2022-10-12 DIAGNOSIS — E785 Hyperlipidemia, unspecified: Secondary | ICD-10-CM | POA: Diagnosis present

## 2022-10-12 DIAGNOSIS — L03115 Cellulitis of right lower limb: Secondary | ICD-10-CM

## 2022-10-12 DIAGNOSIS — H9190 Unspecified hearing loss, unspecified ear: Secondary | ICD-10-CM | POA: Diagnosis present

## 2022-10-12 DIAGNOSIS — Z79899 Other long term (current) drug therapy: Secondary | ICD-10-CM | POA: Diagnosis not present

## 2022-10-12 DIAGNOSIS — G40909 Epilepsy, unspecified, not intractable, without status epilepticus: Secondary | ICD-10-CM | POA: Diagnosis present

## 2022-10-12 DIAGNOSIS — N182 Chronic kidney disease, stage 2 (mild): Secondary | ICD-10-CM | POA: Diagnosis present

## 2022-10-12 DIAGNOSIS — J690 Pneumonitis due to inhalation of food and vomit: Secondary | ICD-10-CM

## 2022-10-12 DIAGNOSIS — Z961 Presence of intraocular lens: Secondary | ICD-10-CM | POA: Diagnosis present

## 2022-10-12 DIAGNOSIS — L989 Disorder of the skin and subcutaneous tissue, unspecified: Secondary | ICD-10-CM

## 2022-10-12 DIAGNOSIS — Z880 Allergy status to penicillin: Secondary | ICD-10-CM

## 2022-10-12 DIAGNOSIS — G4733 Obstructive sleep apnea (adult) (pediatric): Secondary | ICD-10-CM | POA: Diagnosis present

## 2022-10-12 DIAGNOSIS — R7989 Other specified abnormal findings of blood chemistry: Secondary | ICD-10-CM | POA: Diagnosis not present

## 2022-10-12 DIAGNOSIS — M109 Gout, unspecified: Secondary | ICD-10-CM | POA: Diagnosis present

## 2022-10-12 DIAGNOSIS — E1122 Type 2 diabetes mellitus with diabetic chronic kidney disease: Secondary | ICD-10-CM | POA: Diagnosis present

## 2022-10-12 DIAGNOSIS — M25511 Pain in right shoulder: Secondary | ICD-10-CM | POA: Diagnosis present

## 2022-10-12 DIAGNOSIS — F32A Depression, unspecified: Secondary | ICD-10-CM | POA: Diagnosis present

## 2022-10-12 DIAGNOSIS — I10 Essential (primary) hypertension: Secondary | ICD-10-CM

## 2022-10-12 DIAGNOSIS — F02C 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, severe, without behavioral disturbance, psychotic disturbance, mood disturbance, and anxiety: Secondary | ICD-10-CM | POA: Diagnosis not present

## 2022-10-12 DIAGNOSIS — Z882 Allergy status to sulfonamides status: Secondary | ICD-10-CM | POA: Diagnosis not present

## 2022-10-12 DIAGNOSIS — I214 Non-ST elevation (NSTEMI) myocardial infarction: Secondary | ICD-10-CM | POA: Diagnosis not present

## 2022-10-12 DIAGNOSIS — N183 Chronic kidney disease, stage 3 unspecified: Secondary | ICD-10-CM

## 2022-10-12 DIAGNOSIS — F02C3 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, severe, with mood disturbance: Secondary | ICD-10-CM | POA: Diagnosis present

## 2022-10-12 DIAGNOSIS — F02C18 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, severe, with other behavioral disturbance: Secondary | ICD-10-CM | POA: Diagnosis present

## 2022-10-12 DIAGNOSIS — R079 Chest pain, unspecified: Secondary | ICD-10-CM

## 2022-10-12 DIAGNOSIS — Z811 Family history of alcohol abuse and dependence: Secondary | ICD-10-CM

## 2022-10-12 DIAGNOSIS — Z79890 Hormone replacement therapy (postmenopausal): Secondary | ICD-10-CM | POA: Diagnosis not present

## 2022-10-12 DIAGNOSIS — I471 Supraventricular tachycardia, unspecified: Secondary | ICD-10-CM | POA: Diagnosis present

## 2022-10-12 LAB — CBC
HCT: 41.2 % (ref 39.0–52.0)
Hemoglobin: 13.6 g/dL (ref 13.0–17.0)
MCH: 30.3 pg (ref 26.0–34.0)
MCHC: 33 g/dL (ref 30.0–36.0)
MCV: 91.8 fL (ref 80.0–100.0)
Platelets: 157 10*3/uL (ref 150–400)
RBC: 4.49 MIL/uL (ref 4.22–5.81)
RDW: 13.7 % (ref 11.5–15.5)
WBC: 9.7 10*3/uL (ref 4.0–10.5)
nRBC: 0 % (ref 0.0–0.2)

## 2022-10-12 LAB — BASIC METABOLIC PANEL
Anion gap: 11 (ref 5–15)
BUN: 21 mg/dL (ref 8–23)
CO2: 23 mmol/L (ref 22–32)
Calcium: 8.8 mg/dL — ABNORMAL LOW (ref 8.9–10.3)
Chloride: 103 mmol/L (ref 98–111)
Creatinine, Ser: 1.24 mg/dL (ref 0.61–1.24)
GFR, Estimated: 58 mL/min — ABNORMAL LOW (ref >60)
Glucose, Bld: 135 mg/dL — ABNORMAL HIGH (ref 70–99)
Potassium: 3.7 mmol/L (ref 3.5–5.1)
Sodium: 137 mmol/L (ref 135–145)

## 2022-10-12 LAB — LACTIC ACID, PLASMA: Lactic Acid, Venous: 3.4 mmol/L (ref 0.5–1.9)

## 2022-10-12 LAB — PROCALCITONIN: Procalcitonin: 0.13 ng/mL

## 2022-10-12 LAB — TROPONIN I (HIGH SENSITIVITY)
Troponin I (High Sensitivity): 692 ng/L (ref <18)
Troponin I (High Sensitivity): 905 ng/L (ref <18)
Troponin I (High Sensitivity): 721 ng/L (ref <18)

## 2022-10-12 MED ORDER — MORPHINE SULFATE (PF) 2 MG/ML IV SOLN: 2.0000 mg | INTRAVENOUS | Status: DC | PRN

## 2022-10-12 MED ORDER — HEPARIN (PORCINE) 25000 UT/250ML-% IV SOLN
1250.0000 [IU]/h | INTRAVENOUS | Status: AC
  Administered 2022-10-12: 950 [IU]/h via INTRAVENOUS
  Administered 2022-10-13: 1250 [IU]/h via INTRAVENOUS
  Filled 2022-10-12 (×3): qty 250

## 2022-10-12 MED ORDER — METOCLOPRAMIDE HCL 5 MG/ML IJ SOLN: 5.0000 mg | Freq: Four times a day (QID) | INTRAMUSCULAR | Status: DC | PRN

## 2022-10-12 MED ORDER — ASPIRIN 300 MG RE SUPP: 300.0000 mg | RECTAL | Status: AC

## 2022-10-12 MED ORDER — ATORVASTATIN CALCIUM 80 MG PO TABS
80.0000 mg | ORAL_TABLET | Freq: Every day | ORAL | Status: DC
  Administered 2022-10-12 – 2022-10-16 (×5): 80 mg via ORAL
  Filled 2022-10-12: qty 1
  Filled 2022-10-12 (×2): qty 4
  Filled 2022-10-12 (×2): qty 1

## 2022-10-12 MED ORDER — GUAIFENESIN ER 600 MG PO TB12
600.0000 mg | ORAL_TABLET | Freq: Two times a day (BID) | ORAL | Status: DC
  Administered 2022-10-13 – 2022-10-16 (×7): 600 mg via ORAL
  Filled 2022-10-12 (×7): qty 1

## 2022-10-12 MED ORDER — ASPIRIN 81 MG PO CHEW
324.0000 mg | CHEWABLE_TABLET | Freq: Once | ORAL | Status: AC
  Administered 2022-10-12: 324 mg via ORAL
  Filled 2022-10-12: qty 4

## 2022-10-12 MED ORDER — ASPIRIN 81 MG PO TBEC
81.0000 mg | DELAYED_RELEASE_TABLET | Freq: Every day | ORAL | Status: DC
  Administered 2022-10-13 – 2022-10-16 (×4): 81 mg via ORAL
  Filled 2022-10-12 (×4): qty 1

## 2022-10-12 MED ORDER — ASPIRIN 81 MG PO CHEW: 324.0000 mg | CHEWABLE_TABLET | ORAL | Status: AC

## 2022-10-12 MED ORDER — IOHEXOL 350 MG/ML SOLN
75.0000 mL | Freq: Once | INTRAVENOUS | Status: AC | PRN
  Administered 2022-10-12: 75 mL via INTRAVENOUS

## 2022-10-12 MED ORDER — NITROGLYCERIN 0.4 MG SL SUBL: 0.4000 mg | SUBLINGUAL_TABLET | SUBLINGUAL | Status: DC | PRN

## 2022-10-12 MED ORDER — TRAZODONE HCL 50 MG PO TABS
25.0000 mg | ORAL_TABLET | Freq: Every evening | ORAL | Status: DC | PRN
  Administered 2022-10-12 – 2022-10-15 (×4): 25 mg via ORAL
  Filled 2022-10-12 (×4): qty 1

## 2022-10-12 MED ORDER — DOXYCYCLINE HYCLATE 100 MG PO TABS
100.0000 mg | ORAL_TABLET | Freq: Two times a day (BID) | ORAL | Status: DC
  Administered 2022-10-13 – 2022-10-16 (×7): 100 mg via ORAL
  Filled 2022-10-12 (×7): qty 1

## 2022-10-12 MED ORDER — CLINDAMYCIN HCL 150 MG PO CAPS
300.0000 mg | ORAL_CAPSULE | Freq: Once | ORAL | Status: AC
  Administered 2022-10-12: 300 mg via ORAL
  Filled 2022-10-12: qty 2

## 2022-10-12 MED ORDER — LORAZEPAM 2 MG/ML IJ SOLN
1.0000 mg | INTRAMUSCULAR | Status: DC | PRN
  Administered 2022-10-12: 1 mg via INTRAVENOUS
  Filled 2022-10-12: qty 1

## 2022-10-12 MED ORDER — SODIUM CHLORIDE 0.9 % IV SOLN: INTRAVENOUS | Status: DC

## 2022-10-12 MED ORDER — SODIUM CHLORIDE 0.9 % IV SOLN
2.0000 g | INTRAVENOUS | Status: DC
  Administered 2022-10-12: 2 g via INTRAVENOUS
  Filled 2022-10-12: qty 20

## 2022-10-12 MED ORDER — ACETAMINOPHEN 325 MG PO TABS: 650.0000 mg | ORAL_TABLET | ORAL | Status: DC | PRN

## 2022-10-12 MED ORDER — ZIPRASIDONE MESYLATE 20 MG IM SOLR: 10.0000 mg | Freq: Four times a day (QID) | INTRAMUSCULAR | Status: DC | PRN

## 2022-10-12 MED ORDER — HEPARIN BOLUS VIA INFUSION
4000.0000 [IU] | Freq: Once | INTRAVENOUS | Status: AC
  Administered 2022-10-12: 4000 [IU] via INTRAVENOUS
  Filled 2022-10-12: qty 4000

## 2022-10-12 MED ORDER — ONDANSETRON HCL 4 MG/2ML IJ SOLN: 4.0000 mg | Freq: Four times a day (QID) | INTRAMUSCULAR | Status: DC | PRN

## 2022-10-12 MED ORDER — ALPRAZOLAM 0.5 MG PO TABS: 0.2500 mg | ORAL_TABLET | Freq: Two times a day (BID) | ORAL | Status: DC | PRN

## 2022-10-12 MED ORDER — MAGNESIUM HYDROXIDE 400 MG/5ML PO SUSP: 30.0000 mL | Freq: Every day | ORAL | Status: DC | PRN

## 2022-10-12 NOTE — Assessment & Plan Note (Signed)
-   We will continue his antihypertensives. 

## 2022-10-12 NOTE — Assessment & Plan Note (Signed)
-   The patient will be placed on supplement coverage with NovoLog 

## 2022-10-12 NOTE — ED Notes (Signed)
Pt resting at this time.

## 2022-10-12 NOTE — ED Triage Notes (Signed)
Pt BIB [facility] from [facility]. Per EMS, brought in for leg pain and possible UTI. Pt reports he came in for "heart ache" but is unable to explain further. Unsure of patient's baseline mental status. Hx dementia.

## 2022-10-12 NOTE — ED Provider Notes (Signed)
[facility] Provider Note    Event Date/Time   First MD Initiated Contact with Patient 10/12/22 1711     (approximate)   History   Leg Pain and Chest Pain  EM caveat: Dementia  HPI  Stanley Nielsen is a 82 y.o. male hypertension, stroke, diabetes, CKD, and dementia     Patient presents for concerns of report from his nursing facility of possible leg pain and a possible UTI but also concerned that he had a "heart ache" earlier today.  Patient does not recall having any chest pain although he thinks maybe he had some very unclear.  Patient not certain why he is here.  Identifies that we might be at a restaurant.  He does have a known history of dementia.  At the present time he denies having any concerns or chest pain.  Evidently though earlier today he had a complaint of chest pain.  Is also noticed to be a very small area of slight redness or irritation on his right anterior shin   Physical Exam   Triage Vital Signs: ED Triage Vitals  Enc Vitals Group     BP 10/12/22 1602 132/69     Pulse Rate 10/12/22 1602 66     Resp 10/12/22 1602 16     Temp 10/12/22 1602 98.1 F (36.7 C)     Temp Source 10/12/22 1602 Oral     SpO2 10/12/22 1602 100 %     Weight 10/12/22 1603 175 lb (79.4 kg)     Height 10/12/22 1603 6' (1.829 m)     Head Circumference --      Peak Flow --      Pain Score --      Pain Loc --      Pain Edu? --      Excl. in [place]? --     Most recent vital signs: Vitals:   10/12/22 1602  BP: 132/69  Pulse: 66  Resp: 16  Temp: 98.1 F (36.7 C)  SpO2: 100%     General: Awake, no distress.  CV:  Good peripheral perfusion.  Normal tones and rate Resp:  Normal effort.  Lungs bilaterally Abd:  No distention.  Soft nontender nondistended Other:  Moderate bilateral lower extremity peripheral edema slight pitting in nature.  Left leg appears atraumatic right leg appears atraumatic except there is a very small area about the size of a  quarter of very slight erythema perhaps some mild skin irritation but no clearly demarcated edges, is not warm to touch, there is no associated other lesion or injury.  No venous cords or congestion.   ED Results / Procedures / Treatments   Labs (all labs ordered are listed, but only abnormal results are displayed) Labs Reviewed  BASIC METABOLIC PANEL - Abnormal; Notable for the following components:      Result Value   Glucose, Bld 135 (*)    Calcium 8.8 (*)    GFR, Estimated 58 (*)    All other components within normal limits  TROPONIN I (HIGH SENSITIVITY) - Abnormal; Notable for the following components:   Troponin I (High Sensitivity) 692 (*)    All other components within normal limits  CULTURE, BLOOD (ROUTINE X 2)  CULTURE, BLOOD (ROUTINE X 2)  CBC  PROCALCITONIN  LACTIC ACID, PLASMA  PROTIME-INR  APTT  TROPONIN I (HIGH SENSITIVITY)     EKG  Interpreted by me at 1735 heart rate 65 QRS 100 QTc 510 Normal sinus  rhythm, baseline artifact as patient has slight tremor.  No obvious ischemia though this mild ST segment depression noted in V4 and V5.   RADIOLOGY CT Angio Chest PE W and/or Wo Contrast  Result Date: 10/12/2022 CLINICAL DATA:  Pulmonary embolus suspected with high probability. Leg pain and difficulty ambulating. EXAM: CT ANGIOGRAPHY CHEST WITH CONTRAST TECHNIQUE: Multidetector CT imaging of the chest was performed using the standard protocol during bolus administration of intravenous contrast. Multiplanar CT image reconstructions and MIPs were obtained to evaluate the vascular anatomy. RADIATION DOSE REDUCTION: This exam was performed according to the departmental dose-optimization program which includes automated exposure control, adjustment of the mA and/or kV according to patient size and/or use of iterative reconstruction technique. CONTRAST:  75mL OMNIPAQUE IOHEXOL 350 MG/ML SOLN COMPARISON:  Chest radiograph 10/12/2022 FINDINGS: Cardiovascular: There is good  opacification of the central and segmental pulmonary arteries. No focal filling defects. No evidence of significant pulmonary embolus. Normal heart size. No pericardial effusions. Normal caliber thoracic aorta. Calcification of the aorta and coronary arteries. Mediastinum/Nodes: Esophagus is decompressed. Thyroid gland is unremarkable. No significant lymphadenopathy. Lungs/Pleura: Peribronchial infiltration in the left lower lung and left lingula suggesting pneumonia or possibly aspiration. No pleural effusions. No pneumothorax. Upper Abdomen: Increased density in the gallbladder may represent vicarious excretion of contrast material, sludge, or tiny stones/milk of calcium. No wall thickening or inflammatory stranding identified. Musculoskeletal: Degenerative changes in the spine. Review of the MIP images confirms the above findings. IMPRESSION: 1. No evidence of significant pulmonary embolus. 2. Peribronchial infiltrates in the left lower lung and lingula likely representing pneumonia or possibly aspiration. 3. Increased density in the gallbladder suggesting vicarious excretion, sludge, or tiny stones/milk of calcium. Electronically Signed   By: Burman Nieves M.D.   On: 10/12/2022 18:15   DG Chest 2 View  Result Date: 10/12/2022 CLINICAL DATA:  Chest pain. EXAM: CHEST - 2 VIEW COMPARISON:  07/29/2022. FINDINGS: Clear lungs. Normal heart size and mediastinal contours. No pleural effusion or pneumothorax. Visualized bones and upper abdomen are unremarkable. IMPRESSION: No evidence of acute cardiopulmonary disease. Electronically Signed   By: Orvan Falconer M.D.   On: 10/12/2022 16:44       PROCEDURES:  Critical Care performed: No  Procedures   MEDICATIONS ORDERED IN ED: Medications  clindamycin (CLEOCIN) capsule 300 mg (has no administration in time range)  iohexol (OMNIPAQUE) 350 MG/ML injection 75 mL (75 mLs Intravenous Contrast Given 10/12/22 1750)  aspirin chewable tablet 324 mg (324 mg  Oral Given 10/12/22 1855)     IMPRESSION / MDM / ASSESSMENT AND PLAN / ED COURSE  I reviewed the triage vital signs and the nursing notes.                              Differential diagnosis includes, but is not limited to, ACS, NSTEMI, PE, volume overload, etc.  Broad differential given the patient's dementia and complaint of "heart ache" discomfort.  Additionally a small area of same over the right shin but no evidence of trauma no pain or discomfort with movement.  Mild bilateral lower extremity edema.  No evidence of PE.  Further workup, monitoring of this to the hospitalist service at this time I do not see clear evidence of infectious causation.  CT imaging of chest demonstrates possible pneumonia of a potential aspiration pattern.  Penicillin allergy documented.  Will initiate clindamycin.  No clear evidence to suggest sepsis at this time  Patient's  presentation is most consistent with acute complicated illness / injury requiring diagnostic workup.   Consulted with our cardiology team Dr. Eden Emms, he recommends 48 hours of heparin therapy, echocardiogram and cardiology consult to follow   The patient is on the cardiac monitor to evaluate for evidence of arrhythmia and/or significant heart rate changes.   All labs most notable for troponin elevated to approximately 700  Clinical Course as of 10/12/22 1934  Mon Oct 12, 2022  1918 Patient remains pain-free.  Paged Dr. Eden Emms twice for consult, no return page.  [MQ]    Clinical Course User Index [MQ] Sharyn Creamer, MD     FINAL CLINICAL IMPRESSION(S) / ED DIAGNOSES   Final diagnoses:  Elevated troponin  Aspiration pneumonia, unspecified aspiration pneumonia type, unspecified laterality, unspecified part of lung  Skin lesion of right leg     Rx / DC Orders   ED Discharge Orders     None        Note:  This document was prepared using Dragon voice recognition software and may include unintentional dictation  errors.   Sharyn Creamer, MD 10/12/22 [phone]

## 2022-10-12 NOTE — Assessment & Plan Note (Addendum)
-   We will continue Prozac. 

## 2022-10-12 NOTE — Assessment & Plan Note (Addendum)
-   The patient was admitted to a progressive unit bed. - We will continue IV heparin drip. - He will be placed on high-dose statin with Lipitor as well as aspirin, as needed sublingual nitroglycerin and IV morphine sulfate for pain. - Fasting lipids will be obtained. - We will follow serial troponins. - 2D echo and cardiology consult will be obtained. - I notified Dr. Eden Emms about the patient.

## 2022-10-12 NOTE — Assessment & Plan Note (Addendum)
-   We will continue his Namenda. - We will continue his Risperdal and place him on as needed IV Ativan. - We will add as needed IM Geodon.

## 2022-10-12 NOTE — ED Triage Notes (Signed)
First nurse note:Patient arrived by EMS from [facility]. Staff reports patient c/o leg pain and unable to ambulate. Staff concerned for UTI  EMS vitals: 147/76b/p 98.7oral 99% RA 74P

## 2022-10-12 NOTE — Assessment & Plan Note (Addendum)
-   He will be placed on IV Rocephin and doxycycline. - This is concerning for aspiration pneumonia. - We will follow blood cultures.

## 2022-10-12 NOTE — Consult Note (Signed)
ANTICOAGULATION CONSULT NOTE - Initial Consult  Pharmacy Consult for Heparin  Indication: chest pain/ACS  Allergies  Allergen Reactions   Penicillins Hives    Has patient had a PCN reaction causing immediate rash, facial/tongue/throat swelling, SOB or lightheadedness with hypotension: No Has patient had a PCN reaction causing severe rash involving mucus membranes or skin necrosis: No Has patient had a PCN reaction that required hospitalization: No Has patient had a PCN reaction occurring within the last 10 years: No If all of the above answers are "NO", then may proceed with Cephalosporin use.   Sulfa Antibiotics Nausea And Vomiting    Patient Measurements: Height: 6' (182.9 cm) Weight: 79.4 kg (175 lb) IBW/kg (Calculated) : 77.6 Heparin Dosing Weight: 79.4 kg  Vital Signs: Temp: 98.1 F (36.7 C) (04/22 1602) Temp Source: Oral (04/22 1602) BP: 132/69 (04/22 1602) Pulse Rate: 66 (04/22 1602)  Labs: Recent Labs    10/12/22 1606  HGB 13.6  HCT 41.2  PLT 157  CREATININE 1.24  TROPONINIHS 692*    Estimated Creatinine Clearance: 51.3 mL/min (by C-G formula based on SCr of 1.24 mg/dL).   Medical History: Past Medical History:  Diagnosis Date   CKD (chronic kidney disease), stage III    Dementia    Depression    Gout    HOH (hard of hearing)    Hypertension    Sleep apnea    Vertigo     Medications:  (Not in a hospital admission)  Scheduled:   aspirin  324 mg Oral NOW   Or   aspirin  300 mg Rectal NOW   [START ON 10/13/2022] aspirin EC  81 mg Oral Daily   atorvastatin  80 mg Oral Daily   clindamycin  300 mg Oral Once   Infusions:   sodium chloride     PRN: acetaminophen, ALPRAZolam, magnesium hydroxide, metoCLOPramide (REGLAN) injection, nitroGLYCERIN, ondansetron (ZOFRAN) IV, traZODone Anti-infectives (From admission, onward)    Start     Dose/Rate Route Frequency Ordered Stop   10/12/22 1945  clindamycin (CLEOCIN) capsule 300 mg        300 mg Oral   Once 10/12/22 1932         Assessment: 82 y.o. male with a history of hypertension, stroke, diabetes, CKD, and dementia presents to the ED with leg pain and "heart ache". Trop 692. EKG: No obvious ischemia though this mild ST segment depression noted in V4 and V5. Stroke was back in 2018. No DOAC PTA. No PE. CBC stable.   Goal of Therapy:  Heparin level 0.3-0.7 units/ml Monitor platelets by anticoagulation protocol: Yes   Plan:  Give 4000 units bolus x 1 Start heparin infusion at 950 units/hr Check anti-Xa level in 8 hours and daily while on heparin Continue to monitor H&H and platelets  Ronnald Ramp, PharmD, BCPS 10/12/2022,7:36 PM

## 2022-10-12 NOTE — ED Notes (Signed)
Pt sleeping  iv fluids infusing.  Pt has urinary incontinence.

## 2022-10-12 NOTE — H&P (Signed)
[place]   PATIENT NAME: Stanley Nielsen    MR#:  [id number]  DATE OF BIRTH:  1940/11/01  DATE OF ADMISSION:  10/12/2022  PRIMARY CARE PHYSICIAN: Florentina Jenny, MD   Patient is coming from: SNF  REQUESTING/REFERRING PHYSICIAN: Sharyn Creamer, MD  CHIEF COMPLAINT:   Chief Complaint  Patient presents with   Leg Pain   Chest Pain    HISTORY OF PRESENT ILLNESS:  Stanley Nielsen is a 82 y.o. Caucasian male with medical history significant for dementia with behavioral disturbance, depression, stage III chronic kidney disease, gout, hypertension, and OSA, who presented to the emergency room with acute onset of right leg erythema and pain as well as possible UTI in addition to a "heart ache" that occurred earlier today.  The patient admitted to midsternal chest pain felt as pressure but could not grade its severity.  He denies any associated nausea or vomiting or diaphoresis.  Denies any cough or wheezing or dyspnea.  No abdominal pain or melena or bright red being per rectum.  No headache or dizziness.  No dysuria, oliguria or hematuria or flank pain.  The patient is a fairly poor historian due to his dementia.  He became agitated in the ER and was combative  ED Course: When he came to the ER vital signs showed a BP of 132/69 and later 135/71 with otherwise normal vital signs.  Labs revealed a blood glucose of 135 with calcium of 8.8.  High sensitive troponin was 692 and later 905.  Lactic acid was 3.4.  Procalcitonin was 0.13 CBC was within normal. Blood cultures were drawn. EKG as reviewed by me : EKG showed sinus rhythm with a rate of 65 with short PR interval and prolonged QT interval with QTc 511 MS. Imaging: Two-view chest x-ray showed no acute cardiopulmonary disease.  Chest CTA revealed no evidence for PE.  It showed however peribronchial infiltrates in the left lower lung and lingula likely representing pneumonia or possibly aspiration.  It showed increased density in the  gallbladder suggesting the carious excretion, sludge or tiny stones.  The patient was given IV heparin bolus and drip as well as aspirin.  Dr. Eden Emms was notified and is aware about the patient.  The patient will be admitted to a progressive unit bed for further evaluation and management. PAST MEDICAL HISTORY:   Past Medical History:  Diagnosis Date   CKD (chronic kidney disease), stage III    Dementia    Depression    Gout    HOH (hard of hearing)    Hypertension    Sleep apnea    Vertigo     PAST SURGICAL HISTORY:   Past Surgical History:  Procedure Laterality Date   CATARACT EXTRACTION W/PHACO Left 04/30/2015   Procedure: CATARACT EXTRACTION PHACO AND INTRAOCULAR LENS PLACEMENT (IOC);  Surgeon: Galen Manila, MD;  Location: [facility] ORS;  Service: Ophthalmology;  Laterality: Left;  [place] 00:47 AP% 23.5 CDE 11.16 fluid pack lot # [id number]   COLONOSCOPY      SOCIAL HISTORY:   Social History   Tobacco Use   Smoking status: Never   Smokeless tobacco: Never  Substance Use Topics   Alcohol use: Yes    Comment: OCC    FAMILY HISTORY:   Family History  Problem Relation Age of Onset   Dementia Mother    Alcoholism Father     DRUG ALLERGIES:   Allergies  Allergen Reactions   Penicillins Hives    Has patient  had a PCN reaction causing immediate rash, facial/tongue/throat swelling, SOB or lightheadedness with hypotension: No Has patient had a PCN reaction causing severe rash involving mucus membranes or skin necrosis: No Has patient had a PCN reaction that required hospitalization: No Has patient had a PCN reaction occurring within the last 10 years: No If all of the above answers are "NO", then may proceed with Cephalosporin use.   Sulfa Antibiotics Nausea And Vomiting    REVIEW OF SYSTEMS:   ROS As per history of present illness. All pertinent systems were reviewed above. Constitutional, HEENT, cardiovascular, respiratory, GI, GU, musculoskeletal, neuro,  psychiatric, endocrine, integumentary and hematologic systems were reviewed and are otherwise negative/unremarkable except for positive findings mentioned above in the HPI.   MEDICATIONS AT HOME:   Prior to Admission medications   Medication Sig Start Date End Date Taking? Authorizing Provider  cyanocobalamin (VITAMIN B12) 1000 MCG tablet Take 1,000 mcg by mouth daily.   Yes [provider]  divalproex (DEPAKOTE SPRINKLE) 125 MG capsule Take 250 mg by mouth 3 (three) times daily.   Yes [provider]  FLUoxetine (PROZAC) 10 MG capsule Take 10 mg by mouth daily. (Take with 20mg  capsule to equal 30mg  total)   Yes [provider]  FLUoxetine (PROZAC) 20 MG capsule Take 20 mg by mouth daily. (Take with 10mg  capsule to equal 30mg  total)   Yes [provider]  lisinopril (ZESTRIL) 5 MG tablet Take 5 mg by mouth at bedtime.   Yes [provider]  melatonin 5 MG TABS Take 5 mg by mouth at bedtime.   Yes [provider]  memantine (NAMENDA) 5 MG tablet Take 5 mg by mouth at bedtime.   Yes [provider]  Multiple Vitamin (DAILY VITE PO) Take 1 tablet by mouth daily.   Yes [provider]  omeprazole (PRILOSEC) 20 MG capsule Take 20 mg by mouth in the morning.   Yes [provider]  oxymetazoline (AFRIN) 0.05 % nasal spray Place 2 sprays into both nostrils daily as needed (nosebleed).   Yes [provider]  ramelteon (ROZEREM) 8 MG tablet Take 8 mg by mouth at bedtime.   Yes [provider]  risperiDONE (RISPERDAL) 0.5 MG tablet Take 0.5 mg by mouth 2 (two) times daily.   Yes [provider]  sennosides-docusate sodium (SENOKOT-S) 8.6-50 MG tablet Take 2 tablets by mouth at bedtime as needed for constipation.   Yes [provider]  tamsulosin (FLOMAX) 0.4 MG CAPS capsule Take 0.4 mg by mouth at bedtime.   Yes [provider]  acetaminophen (TYLENOL) 500 MG tablet Take 500 mg by  mouth 3 (three) times daily as needed for mild pain. prn    [provider]      VITAL SIGNS:  Blood pressure (!) 168/142, pulse (!) 106, temperature 98.1 F (36.7 C), temperature source Oral, resp. rate 16, height 6' (1.829 m), weight 79.4 kg, SpO2 96 %.  PHYSICAL EXAMINATION:  Physical Exam  GENERAL:  82 y.o.-year-old Caucasian male patient lying in the bed with no acute distress.  EYES: Pupils equal, round, reactive to light and accommodation. No scleral icterus. Extraocular muscles intact.  HEENT: Head atraumatic, normocephalic. Oropharynx and nasopharynx clear.  NECK:  Supple, no jugular venous distention. No thyroid enlargement, no tenderness.  LUNGS: Slightly diminished left basal and midlung zone breath sounds with associated crackles.  No use of accessory muscles of respiration.  CARDIOVASCULAR: Regular rate and rhythm, S1, S2 normal. No murmurs, rubs,  or gallops.  ABDOMEN: Soft, nondistended, nontender. Bowel sounds present. No organomegaly or mass.  EXTREMITIES: Bilateral lower extremity trace pitting edema with no clubbing or cyanosis.  Right leg is much more swollen than the left. NEUROLOGIC: Cranial nerves II through XII are intact. Muscle strength 5/5 in all extremities. Sensation intact. Gait not checked.  PSYCHIATRIC: The patient is alert and oriented x 3.  Normal affect and good eye contact. SKIN: Right anterior leg erythema with induration, warmth and tenderness.  LABORATORY PANEL:   CBC Recent Labs  Lab 10/12/22 1606  WBC 9.7  HGB 13.6  HCT 41.2  PLT 157   ------------------------------------------------------------------------------------------------------------------  Chemistries  Recent Labs  Lab 10/12/22 1606  NA 137  K 3.7  CL 103  CO2 23  GLUCOSE 135*  BUN 21  CREATININE 1.24  CALCIUM 8.8*   ------------------------------------------------------------------------------------------------------------------  Cardiac Enzymes No  results for input(s): "TROPONINI" in the last 168 hours. ------------------------------------------------------------------------------------------------------------------  RADIOLOGY:  CT Angio Chest PE W and/or Wo Contrast  Result Date: 10/12/2022 CLINICAL DATA:  Pulmonary embolus suspected with high probability. Leg pain and difficulty ambulating. EXAM: CT ANGIOGRAPHY CHEST WITH CONTRAST TECHNIQUE: Multidetector CT imaging of the chest was performed using the standard protocol during bolus administration of intravenous contrast. Multiplanar CT image reconstructions and MIPs were obtained to evaluate the vascular anatomy. RADIATION DOSE REDUCTION: This exam was performed according to the departmental dose-optimization program which includes automated exposure control, adjustment of the mA and/or kV according to patient size and/or use of iterative reconstruction technique. CONTRAST:  75mL OMNIPAQUE IOHEXOL 350 MG/ML SOLN COMPARISON:  Chest radiograph 10/12/2022 FINDINGS: Cardiovascular: There is good opacification of the central and segmental pulmonary arteries. No focal filling defects. No evidence of significant pulmonary embolus. Normal heart size. No pericardial effusions. Normal caliber thoracic aorta. Calcification of the aorta and coronary arteries. Mediastinum/Nodes: Esophagus is decompressed. Thyroid gland is unremarkable. No significant lymphadenopathy. Lungs/Pleura: Peribronchial infiltration in the left lower lung and left lingula suggesting pneumonia or possibly aspiration. No pleural effusions. No pneumothorax. Upper Abdomen: Increased density in the gallbladder may represent vicarious excretion of contrast material, sludge, or tiny stones/milk of calcium. No wall thickening or inflammatory stranding identified. Musculoskeletal: Degenerative changes in the spine. Review of the MIP images confirms the above findings. IMPRESSION: 1. No evidence of significant pulmonary embolus. 2. Peribronchial  infiltrates in the left lower lung and lingula likely representing pneumonia or possibly aspiration. 3. Increased density in the gallbladder suggesting vicarious excretion, sludge, or tiny stones/milk of calcium. Electronically Signed   By: Burman Nieves M.D.   On: 10/12/2022 18:15   DG Chest 2 View  Result Date: 10/12/2022 CLINICAL DATA:  Chest pain. EXAM: CHEST - 2 VIEW COMPARISON:  07/29/2022. FINDINGS: Clear lungs. Normal heart size and mediastinal contours. No pleural effusion or pneumothorax. Visualized bones and upper abdomen are unremarkable. IMPRESSION: No evidence of acute cardiopulmonary disease. Electronically Signed   By: Orvan Falconer M.D.   On: 10/12/2022 16:44      IMPRESSION AND PLAN:  Assessment and Plan: * NSTEMI (non-ST elevated myocardial infarction) - The patient was admitted to a progressive unit bed. - We will continue IV heparin drip. - He will be placed on high-dose statin with Lipitor as well as aspirin, as needed sublingual nitroglycerin and IV morphine sulfate for pain. - Fasting lipids will be obtained. - We will follow serial troponins. - 2D echo and cardiology consult will be obtained. - I notified Dr. Eden Emms about the patient.  Cellulitis of right  anterior lower leg - This should be covered with IV Rocephin. - This has enlarged in the ER with widening erythema. - Warm compresses will be applied. - Will obtain right lower extremity venous Doppler to rule out DVT.  Community acquired pneumonia of left lower lobe of lung - He will be placed on IV Rocephin and doxycycline. - This is concerning for aspiration pneumonia. - We will follow blood cultures.  Type 2 diabetes mellitus without complications - The patient will be placed on supplement coverage with NovoLog.  Dementia with behavioral disturbance - We will continue his Namenda. - We will continue his Risperdal and place him on as needed IV Ativan. - We will add as needed IM  Geodon.  Essential hypertension - We will continue his antihypertensives.  Depression - We will continue Prozac.  Seizure disorder - We will continue his Depakote.Marland Kitchen  BPH (benign prostatic hyperplasia) - We will continue his Flomax.   DVT prophylaxis: IV heparin. Advanced Care Planning:  Code Status: full code. Family Communication:  The plan of care was discussed in details with the patient (and family). I answered all questions. The patient agreed to proceed with the above mentioned plan. Further management will depend upon hospital course. Disposition Plan: Back to previous home environment Consults called: [facility] cardiology consult All the records are reviewed and case discussed with ED provider.  Status is: Inpatient   At the time of the admission, it appears that the appropriate admission status for this patient is inpatient.  This is judged to be reasonable and necessary in order to provide the required intensity of service to ensure the patient's safety given the presenting symptoms, physical exam findings and initial radiographic and laboratory data in the context of comorbid conditions.  The patient requires inpatient status due to high intensity of service, high risk of further deterioration and high frequency of surveillance required.  I certify that at the time of admission, it is my clinical judgment that the patient will require inpatient hospital care extending more than 2 midnights.                            Dispo: The patient is from: SNF              Anticipated d/c is to: SNF              Patient currently is not medically stable to d/c.              Difficult to place patient: No  Hannah Beat M.D on 10/12/2022 at 9:21 PM  [facility]   From 7 PM-7 AM, contact night-coverage [email or web address]  CC: Primary care physician; Florentina Jenny, MD

## 2022-10-12 NOTE — Assessment & Plan Note (Signed)
-   We will continue his Depakote. 

## 2022-10-12 NOTE — Assessment & Plan Note (Signed)
-   We will continue his Flomax. 

## 2022-10-12 NOTE — Assessment & Plan Note (Signed)
-   This should be covered with IV Rocephin. - This has enlarged in the ER with widening erythema. - Warm compresses will be applied. - Will obtain right lower extremity venous Doppler to rule out DVT.
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
## 2022-10-12 NOTE — ED Notes (Signed)
U/s in with pt 

## 2022-10-13 ENCOUNTER — Inpatient Hospital Stay
Admit: 2022-10-13 | Discharge: 2022-10-13 | Disposition: A | Discharge status: Home / Self Care | Payer: No Typology Code available for payment source | Attending: Family Medicine

## 2022-10-13 ENCOUNTER — Encounter: Payer: Self-pay | Admitting: Family Medicine

## 2022-10-13 ENCOUNTER — Inpatient Hospital Stay (HOSPITAL_COMMUNITY)
Admit: 2022-10-13 | Discharge: 2022-10-13 | Disposition: A | Discharge status: Home / Self Care | Payer: No Typology Code available for payment source | Attending: Family Medicine | Admitting: Family Medicine

## 2022-10-13 DIAGNOSIS — I214 Non-ST elevation (NSTEMI) myocardial infarction: Secondary | ICD-10-CM | POA: Diagnosis not present

## 2022-10-13 LAB — BASIC METABOLIC PANEL
Anion gap: 6 (ref 5–15)
BUN: 19 mg/dL (ref 8–23)
CO2: 26 mmol/L (ref 22–32)
Calcium: 8 mg/dL — ABNORMAL LOW (ref 8.9–10.3)
Chloride: 104 mmol/L (ref 98–111)
Creatinine, Ser: 0.9 mg/dL (ref 0.61–1.24)
GFR, Estimated: >60 mL/min (ref >60)
Glucose, Bld: 70 mg/dL (ref 70–99)
Potassium: 4 mmol/L (ref 3.5–5.1)
Sodium: 136 mmol/L (ref 135–145)

## 2022-10-13 LAB — ECHOCARDIOGRAM COMPLETE: Height: 72 in

## 2022-10-13 LAB — CBC
HCT: 37.6 % — ABNORMAL LOW (ref 39.0–52.0)
Hemoglobin: 12.3 g/dL — ABNORMAL LOW (ref 13.0–17.0)
MCH: 30.1 pg (ref 26.0–34.0)
MCHC: 32.7 g/dL (ref 30.0–36.0)
MCV: 91.9 fL (ref 80.0–100.0)
Platelets: 122 10*3/uL — ABNORMAL LOW (ref 150–400)
RBC: 4.09 MIL/uL — ABNORMAL LOW (ref 4.22–5.81)
RDW: 13.8 % (ref 11.5–15.5)
WBC: 6.8 10*3/uL (ref 4.0–10.5)
nRBC: 0 % (ref 0.0–0.2)

## 2022-10-13 LAB — LIPID PANEL
Cholesterol: 136 mg/dL (ref 0–200)
HDL: 56 mg/dL (ref >40)
LDL Cholesterol: 75 mg/dL (ref 0–99)
Total CHOL/HDL Ratio: 2.4 RATIO
Triglycerides: 27 mg/dL (ref <150)
VLDL: 5 mg/dL (ref 0–40)

## 2022-10-13 LAB — HEPARIN LEVEL (UNFRACTIONATED)
Heparin Unfractionated: 0.18 IU/mL — ABNORMAL LOW (ref 0.30–0.70)
Heparin Unfractionated: 0.56 IU/mL (ref 0.30–0.70)

## 2022-10-13 LAB — PROTIME-INR
INR: 1.4 — ABNORMAL HIGH (ref 0.8–1.2)
Prothrombin Time: 16.7 seconds — ABNORMAL HIGH (ref 11.4–15.2)

## 2022-10-13 LAB — TROPONIN I (HIGH SENSITIVITY): Troponin I (High Sensitivity): 804 ng/L (ref <18)

## 2022-10-13 LAB — APTT: aPTT: 102 seconds — ABNORMAL HIGH (ref 24–36)

## 2022-10-13 MED ORDER — FLUOXETINE HCL 20 MG PO CAPS
30.0000 mg | ORAL_CAPSULE | Freq: Every day | ORAL | Status: DC
  Administered 2022-10-14 – 2022-10-16 (×3): 30 mg via ORAL
  Filled 2022-10-13 (×3): qty 1

## 2022-10-13 MED ORDER — TAMSULOSIN HCL 0.4 MG PO CAPS
0.4000 mg | ORAL_CAPSULE | Freq: Every day | ORAL | Status: DC
  Administered 2022-10-13 – 2022-10-15 (×3): 0.4 mg via ORAL
  Filled 2022-10-13 (×3): qty 1

## 2022-10-13 MED ORDER — LISINOPRIL 5 MG PO TABS
5.0000 mg | ORAL_TABLET | Freq: Every day | ORAL | Status: DC
  Administered 2022-10-13 – 2022-10-15 (×3): 5 mg via ORAL
  Filled 2022-10-13 (×3): qty 1

## 2022-10-13 MED ORDER — PANTOPRAZOLE SODIUM 40 MG PO TBEC
40.0000 mg | DELAYED_RELEASE_TABLET | Freq: Every day | ORAL | Status: DC
  Administered 2022-10-14 – 2022-10-16 (×3): 40 mg via ORAL
  Filled 2022-10-13 (×3): qty 1

## 2022-10-13 MED ORDER — MEMANTINE HCL 10 MG PO TABS
5.0000 mg | ORAL_TABLET | Freq: Every day | ORAL | Status: DC
  Administered 2022-10-13 – 2022-10-15 (×3): 5 mg via ORAL
  Filled 2022-10-13 (×3): qty 1

## 2022-10-13 MED ORDER — RISPERIDONE 0.5 MG PO TABS
0.5000 mg | ORAL_TABLET | Freq: Two times a day (BID) | ORAL | Status: DC
  Administered 2022-10-13 – 2022-10-16 (×6): 0.5 mg via ORAL
  Filled 2022-10-13 (×6): qty 1

## 2022-10-13 MED ORDER — HEPARIN BOLUS VIA INFUSION
2350.0000 [IU] | Freq: Once | INTRAVENOUS | Status: AC
  Administered 2022-10-13: 2350 [IU] via INTRAVENOUS
  Filled 2022-10-13: qty 2350

## 2022-10-13 MED ORDER — ZIPRASIDONE MESYLATE 20 MG IM SOLR: 10.0000 mg | Freq: Two times a day (BID) | INTRAMUSCULAR | Status: DC | PRN

## 2022-10-13 MED ORDER — VITAMIN B-12 1000 MCG PO TABS
1000.0000 ug | ORAL_TABLET | Freq: Every day | ORAL | Status: DC
  Administered 2022-10-14 – 2022-10-16 (×3): 1000 ug via ORAL
  Filled 2022-10-13 (×3): qty 1

## 2022-10-13 MED ORDER — FLUOXETINE HCL 20 MG PO CAPS: 20.0000 mg | ORAL_CAPSULE | Freq: Every day | ORAL | Status: DC

## 2022-10-13 MED ORDER — DIVALPROEX SODIUM 125 MG PO CSDR
250.0000 mg | DELAYED_RELEASE_CAPSULE | Freq: Three times a day (TID) | ORAL | Status: DC
  Administered 2022-10-13 – 2022-10-16 (×9): 250 mg via ORAL
  Filled 2022-10-13 (×11): qty 2
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
## 2022-10-13 NOTE — Consult Note (Signed)
ANTICOAGULATION CONSULT NOTE - Initial Consult  Pharmacy Consult for Heparin  Indication: chest pain/ACS  Allergies  Allergen Reactions   Penicillins Hives    Has patient had a PCN reaction causing immediate rash, facial/tongue/throat swelling, SOB or lightheadedness with hypotension: No Has patient had a PCN reaction causing severe rash involving mucus membranes or skin necrosis: No Has patient had a PCN reaction that required hospitalization: No Has patient had a PCN reaction occurring within the last 10 years: No If all of the above answers are "NO", then may proceed with Cephalosporin use.   Sulfa Antibiotics Nausea And Vomiting    Patient Measurements: Height: 6' (182.9 cm) Weight: 79.4 kg (175 lb) IBW/kg (Calculated) : 77.6 Heparin Dosing Weight: 79.4 kg  Vital Signs: Temp: 98.5 F (36.9 C) (04/23 1126) Temp Source: Oral (04/23 1126) BP: 154/71 (04/23 1600) Pulse Rate: 81 (04/23 1451)  Labs: Recent Labs    10/12/22 1606 10/12/22 1906 10/12/22 2245 10/13/22 0608 10/13/22 0639 10/13/22 1544  HGB 13.6  --   --  12.3*  --   --   HCT 41.2  --   --  37.6*  --   --   PLT 157  --   --  122*  --   --   APTT  --   --   --   --  102*  --   LABPROT  --   --   --   --  16.7*  --   INR  --   --   --   --  1.4*  --   HEPARINUNFRC  --   --   --   --  0.18* 0.56  CREATININE 1.24  --   --  0.90  --   --   TROPONINIHS 692* 905* 721*  --   --   --      Estimated Creatinine Clearance: 70.7 mL/min (by C-G formula based on SCr of 0.9 mg/dL).   Medical History: Past Medical History:  Diagnosis Date   CKD (chronic kidney disease), stage III    Dementia    Depression    Gout    HOH (hard of hearing)    Hypertension    Sleep apnea    Vertigo     Medications:  (Not in a hospital admission) Scheduled:   aspirin  324 mg Oral NOW   Or   aspirin  300 mg Rectal NOW   aspirin EC  81 mg Oral Daily   atorvastatin  80 mg Oral Daily   [START ON 10/14/2022] cyanocobalamin   1,000 mcg Oral Daily   divalproex  250 mg Oral TID   doxycycline  100 mg Oral Q12H   [START ON 10/14/2022] FLUoxetine  30 mg Oral Daily   guaiFENesin  600 mg Oral BID   lisinopril  5 mg Oral QHS   memantine  5 mg Oral QHS   [START ON 10/14/2022] pantoprazole  40 mg Oral Daily   risperiDONE  0.5 mg Oral BID   tamsulosin  0.4 mg Oral QHS   Infusions:   heparin 1,250 Units/hr (10/13/22 1229)   PRN: acetaminophen, LORazepam, magnesium hydroxide, metoCLOPramide (REGLAN) injection, nitroGLYCERIN, ondansetron (ZOFRAN) IV, traZODone, ziprasidone Anti-infectives (From admission, onward)    Start     Dose/Rate Route Frequency Ordered Stop   10/12/22 2200  doxycycline (VIBRA-TABS) tablet 100 mg        100 mg Oral Every 12 hours 10/12/22 1949     10/12/22 2000  cefTRIAXone (  ROCEPHIN) 2 g in sodium chloride 0.9 % 100 mL IVPB  Status:  Discontinued        2 g 200 mL/hr over 30 Minutes Intravenous Every 24 hours 10/12/22 1949 10/13/22 1513   10/12/22 1945  clindamycin (CLEOCIN) capsule 300 mg        300 mg Oral  Once 10/12/22 1932 10/12/22 1953       Assessment: 82 y.o. male with a history of hypertension, stroke, diabetes, CKD, and dementia presents to the ED with leg pain and "heart ache". Trop 692. EKG: No obvious ischemia though this mild ST segment depression noted in V4 and V5. Stroke was back in 2018. No DOAC PTA. No PE. CBC stable.   Goal of Therapy:  Heparin level 0.3-0.7 units/ml Monitor platelets by anticoagulation protocol: Yes   Plan:  4/23:  HL @ 0639 = 0.18, SUBtherapeutic 4/23:  HL @ 1544 = 0.56, therapeutic x 1   - Will continue heparin drip rate to 1250 units/hr.  - Will recheck HL 8 hrs after rate change  Manfred Shirts, PharmD 10/13/2022,4:26 PM

## 2022-10-13 NOTE — Hospital Course (Signed)
804  692  [phone]  [phone] 

## 2022-10-13 NOTE — Progress Notes (Signed)
[facility]  - [place] at [facility]   PATIENT NAME: Stanley Nielsen    MR#:  [id number]  DATE OF BIRTH:  09/17/1940  SUBJECTIVE:  patient has dementia in a very poor historian. Spoke with cousin [name] stuck and he told me patient has been having bilateral foot pain. He is at [employer]. Unable to ambulate much due to pain and used wheelchair last few days. No fever. Patient hemodynamically stable. No respiratory distress or cough. Lower extremity edema mild. No erythema noted. Patient was combative earlier per staff. No cough or shortness of breath   VITALS:  Blood pressure (!) 164/69, pulse 81, temperature 98.5 F (36.9 C), temperature source Oral, resp. rate 12, height 6' (1.829 m), weight 79.4 kg, SpO2 100 %.  PHYSICAL EXAMINATION:   GENERAL:  82 y.o.-year-old patient with no acute distress.  LUNGS: Normal breath sounds bilaterally, no wheezing CARDIOVASCULAR: S1, S2 normal. No murmur   ABDOMEN: Soft, nontender, nondistended. Bowel sounds present.  EXTREMITIES: + edema b/l.  NEUROLOGIC: nonfocal  patient is alert, dementia at baseline   LABORATORY PANEL:  CBC Recent Labs  Lab 10/13/22 0608  WBC 6.8  HGB 12.3*  HCT 37.6*  PLT 122*    Chemistries  Recent Labs  Lab 10/13/22 0608  NA 136  K 4.0  CL 104  CO2 26  GLUCOSE 70  BUN 19  CREATININE 0.90  CALCIUM 8.0*   Cardiac Enzymes No results for input(s): "TROPONINI" in the last 168 hours. RADIOLOGY:  US Venous Img Lower Unilateral Right (DVT)  Result Date: 10/13/2022 CLINICAL DATA:  Right lower extremity swelling EXAM: RIGHT LOWER EXTREMITY VENOUS DOPPLER ULTRASOUND TECHNIQUE: Gray-scale sonography with compression, as well as color and duplex ultrasound, were performed to evaluate the deep venous system(s) from the level of the common femoral vein through the popliteal and proximal calf veins. COMPARISON:  None Available. FINDINGS: VENOUS Normal compressibility of the common femoral,  superficial femoral, and popliteal veins, as well as the visualized calf veins. Visualized portions of profunda femoral vein and great saphenous vein unremarkable. No filling defects to suggest DVT on grayscale or color Doppler imaging. Doppler waveforms show normal direction of venous flow, normal respiratory plasticity and response to augmentation. Limited views of the contralateral common femoral vein are unremarkable. OTHER Right popliteal fossa cyst measuring 3.2 x 2.9 x 0.9 cm. Limitations: none IMPRESSION: No evidence of right lower extremity DVT. Right Baker's cyst. Electronically Signed   By: Charlett Nose M.D.   On: 10/13/2022 00:23   CT Angio Chest PE W and/or Wo Contrast  Result Date: 10/12/2022 CLINICAL DATA:  Pulmonary embolus suspected with high probability. Leg pain and difficulty ambulating. EXAM: CT ANGIOGRAPHY CHEST WITH CONTRAST TECHNIQUE: Multidetector CT imaging of the chest was performed using the standard protocol during bolus administration of intravenous contrast. Multiplanar CT image reconstructions and MIPs were obtained to evaluate the vascular anatomy. RADIATION DOSE REDUCTION: This exam was performed according to the departmental dose-optimization program which includes automated exposure control, adjustment of the mA and/or kV according to patient size and/or use of iterative reconstruction technique. CONTRAST:  75mL OMNIPAQUE IOHEXOL 350 MG/ML SOLN COMPARISON:  Chest radiograph 10/12/2022 FINDINGS: Cardiovascular: There is good opacification of the central and segmental pulmonary arteries. No focal filling defects. No evidence of significant pulmonary embolus. Normal heart size. No pericardial effusions. Normal caliber thoracic aorta. Calcification of the aorta and coronary arteries. Mediastinum/Nodes: Esophagus is decompressed. Thyroid gland is unremarkable. No significant lymphadenopathy. Lungs/Pleura: Peribronchial infiltration in the  left lower lung and left lingula suggesting  pneumonia or possibly aspiration. No pleural effusions. No pneumothorax. Upper Abdomen: Increased density in the gallbladder may represent vicarious excretion of contrast material, sludge, or tiny stones/milk of calcium. No wall thickening or inflammatory stranding identified. Musculoskeletal: Degenerative changes in the spine. Review of the MIP images confirms the above findings. IMPRESSION: 1. No evidence of significant pulmonary embolus. 2. Peribronchial infiltrates in the left lower lung and lingula likely representing pneumonia or possibly aspiration. 3. Increased density in the gallbladder suggesting vicarious excretion, sludge, or tiny stones/milk of calcium. Electronically Signed   By: Burman Nieves M.D.   On: 10/12/2022 18:15   DG Chest 2 View  Result Date: 10/12/2022 CLINICAL DATA:  Chest pain. EXAM: CHEST - 2 VIEW COMPARISON:  07/29/2022. FINDINGS: Clear lungs. Normal heart size and mediastinal contours. No pleural effusion or pneumothorax. Visualized bones and upper abdomen are unremarkable. IMPRESSION: No evidence of acute cardiopulmonary disease. Electronically Signed   By: Orvan Falconer M.D.   On: 10/12/2022 16:44    Assessment and Plan Stanley Nielsen is a 82 y.o. Caucasian male with medical history significant for dementia with behavioral disturbance, depression, stage III chronic kidney disease, gout, hypertension, and OSA, who presented to the emergency room with acute onset of right leg erythema and pain as well as possible UTI in addition to a "heart ache" that occurred earlier today.   NSTEMI (non-ST elevated myocardial infarction) - continue IV heparin drip. [facility] cardiology consulted --asa, statins - troponin trending down - 2D echo --pending --pt poor historian  Cellulitis of right anterior lower leg--appears resolved - change to po doxycycline --wbc normal --US venous US negative   Abnormal CT chest -- clinically does not appear to be pneumonia  -- white  count normal. No cough. Pro calcitonin .13. -- Will continue doxycycline for five days. Empiric -- consider speech eval if send symptoms of aspiration suspected  type 2 diabetes mellitus without complications - on supplement coverage with NovoLog.   Dementia with behavioral disturbance Depression - cont psych meds    Essential hypertension - lisinopril   BPH (benign prostatic hyperplasia) - continue his Flomax.   Family communication :Sela Hilding (cousin) Consults :[facility] cardiology CODE STATUS: FULL DVT Prophylaxis :heparin gtt Level of care: Progressive Status is: Inpatient Remains inpatient appropriate because: for NSTEMI     TOTAL TIME TAKING CARE OF THIS PATIENT: 35 minutes.  >50% time spent on counselling and coordination of care  Note: This dictation was prepared with Dragon dictation along with smaller phrase technology. Any transcriptional errors that result from this process are unintentional.  Enedina Finner M.D    [facility]   CC: Primary care physician; Florentina Jenny, MD

## 2022-10-13 NOTE — Consult Note (Signed)
ANTICOAGULATION CONSULT NOTE - Initial Consult  Pharmacy Consult for Heparin  Indication: chest pain/ACS  Allergies  Allergen Reactions   Penicillins Hives    Has patient had a PCN reaction causing immediate rash, facial/tongue/throat swelling, SOB or lightheadedness with hypotension: No Has patient had a PCN reaction causing severe rash involving mucus membranes or skin necrosis: No Has patient had a PCN reaction that required hospitalization: No Has patient had a PCN reaction occurring within the last 10 years: No If all of the above answers are "NO", then may proceed with Cephalosporin use.   Sulfa Antibiotics Nausea And Vomiting    Patient Measurements: Height: 6' (182.9 cm) Weight: 79.4 kg (175 lb) IBW/kg (Calculated) : 77.6 Heparin Dosing Weight: 79.4 kg  Vital Signs: Temp: 98.2 F (36.8 C) (04/22 2338) Temp Source: Axillary (04/22 2338) BP: 157/98 (04/23 0400) Pulse Rate: 69 (04/23 0630)  Labs: Recent Labs    10/12/22 1606 10/12/22 1906 10/12/22 2245 10/13/22 0608 10/13/22 0639  HGB 13.6  --   --  12.3*  --   HCT 41.2  --   --  37.6*  --   PLT 157  --   --  122*  --   APTT  --   --   --   --  102*  LABPROT  --   --   --   --  16.7*  INR  --   --   --   --  1.4*  HEPARINUNFRC  --   --   --   --  0.18*  CREATININE 1.24  --   --  0.90  --   TROPONINIHS 692* 905* 721*  --   --      Estimated Creatinine Clearance: 70.7 mL/min (by C-G formula based on SCr of 0.9 mg/dL).   Medical History: Past Medical History:  Diagnosis Date   CKD (chronic kidney disease), stage III    Dementia    Depression    Gout    HOH (hard of hearing)    Hypertension    Sleep apnea    Vertigo     Medications:  (Not in a hospital admission) Scheduled:   aspirin  324 mg Oral NOW   Or   aspirin  300 mg Rectal NOW   aspirin EC  81 mg Oral Daily   atorvastatin  80 mg Oral Daily   doxycycline  100 mg Oral Q12H   guaiFENesin  600 mg Oral BID   heparin  2,350 Units  Intravenous Once   Infusions:   sodium chloride 100 mL/hr at 10/12/22 2043   cefTRIAXone (ROCEPHIN)  IV Stopped (10/12/22 2114)   heparin 950 Units/hr (10/12/22 2042)   PRN: acetaminophen, ALPRAZolam, LORazepam, magnesium hydroxide, metoCLOPramide (REGLAN) injection, morphine injection, nitroGLYCERIN, ondansetron (ZOFRAN) IV, traZODone, ziprasidone Anti-infectives (From admission, onward)    Start     Dose/Rate Route Frequency Ordered Stop   10/12/22 2200  doxycycline (VIBRA-TABS) tablet 100 mg        100 mg Oral Every 12 hours 10/12/22 1949     10/12/22 2000  cefTRIAXone (ROCEPHIN) 2 g in sodium chloride 0.9 % 100 mL IVPB        2 g 200 mL/hr over 30 Minutes Intravenous Every 24 hours 10/12/22 1949 10/17/22 1959   10/12/22 1945  clindamycin (CLEOCIN) capsule 300 mg        300 mg Oral  Once 10/12/22 1932 10/12/22 1953       Assessment: 82 y.o. male with a  history of hypertension, stroke, diabetes, CKD, and dementia presents to the ED with leg pain and "heart ache". Trop 692. EKG: No obvious ischemia though this mild ST segment depression noted in V4 and V5. Stroke was back in 2018. No DOAC PTA. No PE. CBC stable.   Goal of Therapy:  Heparin level 0.3-0.7 units/ml Monitor platelets by anticoagulation protocol: Yes   Plan:  4/23:  HL @ 0639 = 0.18, SUBtherapeutic  - Will order heparin 2350 units IV X 1 bolus and increase drip rate to 1250 units/hr.  - Will recheck HL 8 hrs after rate change  Kein Carlberg D, PharmD 10/13/2022,7:10 AM

## 2022-10-13 NOTE — Consult Note (Signed)
Cardiology Consult    Patient ID: Stanley Nielsen MRN: [id number], DOB/AGE: 82/06/1940   Admit date: 10/12/2022 Date of Consult: 10/13/2022  Primary Physician: Florentina Jenny, MD Primary Cardiologist: Bryan Lemma, MD Requesting Provider: Kathie Rhodes. Stanley Katz, MD  Patient Profile    Stanley Nielsen is a 82 y.o. male with a history of hypertension, stage III chronic kidney disease, dementia, depression, and sleep apnea, who is being seen today for the evaluation of chest pain and elevated troponin at the request of Dr. Allena Nielsen.  Past Medical History   Past Medical History:  Diagnosis Date   CKD (chronic kidney disease), stage III    Dementia    Depression    Gout    HOH (hard of hearing)    Hypertension    Sleep apnea    Vertigo     Past Surgical History:  Procedure Laterality Date   CATARACT EXTRACTION W/PHACO Left 04/30/2015   Procedure: CATARACT EXTRACTION PHACO AND INTRAOCULAR LENS PLACEMENT (IOC);  Surgeon: Galen Manila, MD;  Location: [facility] ORS;  Service: Ophthalmology;  Laterality: Left;  [place] 00:47 AP% 23.5 CDE 11.16 fluid pack lot # [id number]   COLONOSCOPY       Allergies  Allergies  Allergen Reactions   Penicillins Hives    Has patient had a PCN reaction causing immediate rash, facial/tongue/throat swelling, SOB or lightheadedness with hypotension: No Has patient had a PCN reaction causing severe rash involving mucus membranes or skin necrosis: No Has patient had a PCN reaction that required hospitalization: No Has patient had a PCN reaction occurring within the last 10 years: No If all of the above answers are "NO", then may proceed with Cephalosporin use.   Sulfa Antibiotics Nausea And Vomiting    History of Present Illness    82 year old male with above past medical history including hypertension, stage III chronic kidney disease, dementia, depression, and sleep apnea.  He has had prior hospitalizations secondary to loss of consciousness and concerns  related to syncope in December 2022 and twice in April 2023.  He was noted to have prolonged QT in the setting of galantamine and Seroquel therapy in April 2023 prompting discontinuation.  Echocardiogram at that time showed an EF of 60 to 65% with moderate LVH and grade 1 diastolic dysfunction.  Aortic sclerosis was present without stenosis.  He subsequently followed up with Dr. Herbie Baltimore in May 2023.  Event monitoring was undertaken and showed one 3.3-second pause and brief runs of SVT.  He was referred to electrophysiology and saw Dr. Graciela Husbands in August 2023.  Was felt that profound protracted unresponsiveness was inconsistent with syncope were explained by findings on monitoring with QT prolongation.  He was felt to have orthostatic hypotension in the setting of resting hypertension.  In the setting of significant dementia, patient stays at [facility] house.  He was unable to provide details related to current admission.  Per notes, he presented to the emergency department on the afternoon of April 22 via EMS secondary to complaints of leg pain and staff concern for UTI.  He was noted to have right lower leg erythema and also complained of midsternal chest pain.  ECG was unremarkable.  Lab work showed a troponin of 804 with subsequent relatively flat trend of 692, 905, 721.  He was placed on heparin.  Other lab work notable for lactic acid of 3.4, procalcitonin of 0.13.  CBC was normal.  CTA of the chest was negative for PE.  Peribronchial infiltrates in the left lower lung  and lingula were felt to represent pneumonia and possibly aspiration.  Right lower extremity venous Doppler showed a right popliteal fossa cyst but no DVT.  He was admitted by the medicine team and placed on antibiotic therapy.  Upon our interview, he is disoriented to time and place and unable to provide any history.  Inpatient Medications     aspirin  324 mg Oral NOW   Or   aspirin  300 mg Rectal NOW   aspirin EC  81 mg Oral Daily    atorvastatin  80 mg Oral Daily   [START ON 10/14/2022] cyanocobalamin  1,000 mcg Oral Daily   divalproex  250 mg Oral TID   doxycycline  100 mg Oral Q12H   [START ON 10/14/2022] FLUoxetine  30 mg Oral Daily   guaiFENesin  600 mg Oral BID   lisinopril  5 mg Oral QHS   memantine  5 mg Oral QHS   [START ON 10/14/2022] pantoprazole  40 mg Oral Daily   risperiDONE  0.5 mg Oral BID   tamsulosin  0.4 mg Oral QHS    Family History-obtained from prior medical records as dementia prevents patient from providing this history    Family History  Problem Relation Age of Onset   Dementia Mother    Alcoholism Father    Social History -obtained from prior medical records as dementia prevents patient from providing this history    Social History   Socioeconomic History   Marital status: Divorced    Spouse name: Not on file   Number of children: Not on file   Years of education: Not on file   Highest education level: Not on file  Occupational History   Not on file  Tobacco Use   Smoking status: Never   Smokeless tobacco: Never  Vaping Use   Vaping Use: Never used  Substance and Sexual Activity   Alcohol use: Yes    Comment: OCC   Drug use: No   Sexual activity: Not Currently  Other Topics Concern   Not on file  Social History Narrative   He Is a Photographer at [employer].*       Emergency contact: Karna Dupes (daughter)   *[place], [place]; telephone not listed      Next of kin: Davonne Jarnigan (son): Telephone [phone]   Social Determinants of Health   Financial Resource Strain: Not on file  Food Insecurity: No Food Insecurity (10/13/2022)   Hunger Vital Sign    Worried About Running Out of Food in the Last Year: Never true    Ran Out of Food in the Last Year: Never true  Transportation Needs: No Transportation Needs (10/13/2022)   PRAPARE - Administrator, Civil Service (Medical): No    Lack of Transportation (Non-Medical): No  Physical Activity: Not on file   Stress: Not on file  Social Connections: Not on file  Intimate Partner Violence: Not At Risk (10/13/2022)   Humiliation, Afraid, Rape, and Kick questionnaire    Fear of Current or Ex-Partner: No    Emotionally Abused: No    Physically Abused: No    Sexually Abused: No     Review of Systems     Patient currently has no complaints and is unable to provide a reliable history in the setting of disorientation.  Physical Exam    Blood pressure (!) 154/71, pulse 81, temperature 98.5 F (36.9 C), temperature source Oral, resp. rate 14, height 6' (1.829 m), weight 79.4 kg, SpO2 100 %.  General: Pleasant, NAD Psych: Normal affect. Neuro: Disoriented to time and place.  Moves all extremities spontaneously. HEENT: Normal  Neck: Supple without bruits or JVD. Lungs:  Resp regular and unlabored, diminished breath sounds bilaterally-poor effort. Heart: RRR 3/6 systolic ejection murmur at the upper sternal borders. Abdomen: Soft, non-tender, non-distended, BS + x 4.  Extremities: No clubbing, cyanosis.  Trace right lower extremity edema with mild right lower leg erythema.  DP/PT2+, Radials 2+ and equal bilaterally.  Labs    Cardiac Enzymes Recent Labs  Lab 10/12/22 0608 10/12/22 1606 10/12/22 1906 10/12/22 2245  TROPONINIHS 804* 692* 905* 721*     BNP    Component Value Date/Time   BNP 77.2 10/01/2021 1906   Lab Results  Component Value Date   WBC 6.8 10/13/2022   HGB 12.3 (L) 10/13/2022   HCT 37.6 (L) 10/13/2022   MCV 91.9 10/13/2022   PLT 122 (L) 10/13/2022    Recent Labs  Lab 10/13/22 0608  NA 136  K 4.0  CL 104  CO2 26  BUN 19  CREATININE 0.90  CALCIUM 8.0*  GLUCOSE 70   Lab Results  Component Value Date   CHOL 136 10/13/2022   HDL 56 10/13/2022   LDLCALC 75 10/13/2022   TRIG 27 10/13/2022      Radiology Studies    US Venous Img Lower Unilateral Right (DVT)  Result Date: 10/13/2022 CLINICAL DATA:  Right lower extremity swelling EXAM: RIGHT LOWER  EXTREMITY VENOUS DOPPLER ULTRASOUND TECHNIQUE: Gray-scale sonography with compression, as well as color and duplex ultrasound, were performed to evaluate the deep venous system(s) from the level of the common femoral vein through the popliteal and proximal calf veins. COMPARISON:  None Available. FINDINGS: VENOUS Normal compressibility of the common femoral, superficial femoral, and popliteal veins, as well as the visualized calf veins. Visualized portions of profunda femoral vein and great saphenous vein unremarkable. No filling defects to suggest DVT on grayscale or color Doppler imaging. Doppler waveforms show normal direction of venous flow, normal respiratory plasticity and response to augmentation. Limited views of the contralateral common femoral vein are unremarkable. OTHER Right popliteal fossa cyst measuring 3.2 x 2.9 x 0.9 cm. Limitations: none IMPRESSION: No evidence of right lower extremity DVT. Right Baker's cyst. Electronically Signed   By: Charlett Nose M.D.   On: 10/13/2022 00:23   CT Angio Chest PE W and/or Wo Contrast  Result Date: 10/12/2022 CLINICAL DATA:  Pulmonary embolus suspected with high probability. Leg pain and difficulty ambulating. EXAM: CT ANGIOGRAPHY CHEST WITH CONTRAST TECHNIQUE: Multidetector CT imaging of the chest was performed using the standard protocol during bolus administration of intravenous contrast. Multiplanar CT image reconstructions and MIPs were obtained to evaluate the vascular anatomy. RADIATION DOSE REDUCTION: This exam was performed according to the departmental dose-optimization program which includes automated exposure control, adjustment of the mA and/or kV according to patient size and/or use of iterative reconstruction technique. CONTRAST:  75mL OMNIPAQUE IOHEXOL 350 MG/ML SOLN COMPARISON:  Chest radiograph 10/12/2022 FINDINGS: Cardiovascular: There is good opacification of the central and segmental pulmonary arteries. No focal filling defects. No evidence  of significant pulmonary embolus. Normal heart size. No pericardial effusions. Normal caliber thoracic aorta. Calcification of the aorta and coronary arteries. Mediastinum/Nodes: Esophagus is decompressed. Thyroid gland is unremarkable. No significant lymphadenopathy. Lungs/Pleura: Peribronchial infiltration in the left lower lung and left lingula suggesting pneumonia or possibly aspiration. No pleural effusions. No pneumothorax. Upper Abdomen: Increased density in the gallbladder may represent vicarious excretion of contrast  material, sludge, or tiny stones/milk of calcium. No wall thickening or inflammatory stranding identified. Musculoskeletal: Degenerative changes in the spine. Review of the MIP images confirms the above findings. IMPRESSION: 1. No evidence of significant pulmonary embolus. 2. Peribronchial infiltrates in the left lower lung and lingula likely representing pneumonia or possibly aspiration. 3. Increased density in the gallbladder suggesting vicarious excretion, sludge, or tiny stones/milk of calcium. Electronically Signed   By: Burman Nieves M.D.   On: 10/12/2022 18:15   DG Chest 2 View  Result Date: 10/12/2022 CLINICAL DATA:  Chest pain. EXAM: CHEST - 2 VIEW COMPARISON:  07/29/2022. FINDINGS: Clear lungs. Normal heart size and mediastinal contours. No pleural effusion or pneumothorax. Visualized bones and upper abdomen are unremarkable. IMPRESSION: No evidence of acute cardiopulmonary disease. Electronically Signed   By: Orvan Falconer M.D.   On: 10/12/2022 16:44    ECG & Cardiac Imaging    Regular sinus rhythm, 65, nonspecific ST and T changes- personally reviewed.  Assessment & Plan    1.  Non-STEMI: Patient without prior history of coronary artery disease with normal LV function by echo in 2023.  Presented from skilled nursing facility in the setting of concern related to right lower extremity erythema and possible UTI.  He apparently complained of chest pain and was noted to  have elevated troponin of [phone].  ECG without acute ST or T changes.  Upon our interview, patient is disoriented to time and place and unable to provide any meaningful history as to his symptoms or prompted for presentation.  In this setting, he is a poor candidate for any invasive procedures.  Awaiting 2D echocardiogram to evaluate LV function.  Plan for conservative management with heparin x 48 hours, aspirin, statin, and ACE inhibitor therapy.  Beta-blocker previously avoided in the setting of 3.3-second pause noted on outpatient monitoring in 2023 with prior questionable history of syncope.  2.  Right lower extremity cellulitis: Mild erythema to the right lower leg.  Doxycycline started by medicine team.  3.  Stage III chronic kidney disease: Creatinine is normal at 0.90.  He remains on lisinopril therapy.  4.  Hyperlipidemia: LDL of 75.  Continue statin.  5.  Dementia: Lives at skilled nursing facility.  6.  Questionable history of syncope: Previously seen by electrophysiology.  Symptoms and prolonged duration of unresponsiveness not felt to be consistent with syncope/arrhythmia.  Risk Assessment/Risk Scores:     TIMI Risk Score for Unstable Angina or Non-ST Elevation MI:   The patient's TIMI risk score is 4, which indicates a 20% risk of all cause mortality, new or recurrent myocardial infarction or need for urgent revascularization in the next 14 days.      Signed, Nicolasa Ducking, NP 10/13/2022, 6:43 PM  For questions or updates, please contact   Please consult [email or web address] for contact info under Cardiology/STEMI.

## 2022-10-13 NOTE — ED Notes (Signed)
Troponin of 804 is from 10/13/22 0608, not 10/12/22 0608.

## 2022-10-13 NOTE — ED Notes (Signed)
Report received, this RN now assuming care.  

## 2022-10-14 ENCOUNTER — Inpatient Hospital Stay: Payer: No Typology Code available for payment source

## 2022-10-14 DIAGNOSIS — R7989 Other specified abnormal findings of blood chemistry: Secondary | ICD-10-CM

## 2022-10-14 DIAGNOSIS — I214 Non-ST elevation (NSTEMI) myocardial infarction: Secondary | ICD-10-CM | POA: Diagnosis not present

## 2022-10-14 DIAGNOSIS — E119 Type 2 diabetes mellitus without complications: Secondary | ICD-10-CM | POA: Diagnosis not present

## 2022-10-14 DIAGNOSIS — F03918 Unspecified dementia, unspecified severity, with other behavioral disturbance: Secondary | ICD-10-CM | POA: Diagnosis not present

## 2022-10-14 LAB — HEPARIN LEVEL (UNFRACTIONATED): Heparin Unfractionated: 0.41 IU/mL (ref 0.30–0.70)

## 2022-10-14 LAB — ECHOCARDIOGRAM COMPLETE
Area-P 1/2: 3.34 cm2
P 1/2 time: 353 msec
S' Lateral: 2.7 cm
Weight: 2800 oz

## 2022-10-14 LAB — CULTURE, BLOOD (ROUTINE X 2)
Culture: NO GROWTH
Special Requests: ADEQUATE

## 2022-10-14 LAB — GLUCOSE, CAPILLARY
Glucose-Capillary: 92 mg/dL (ref 70–99)
Glucose-Capillary: 93 mg/dL (ref 70–99)

## 2022-10-14 MED ORDER — CLOPIDOGREL BISULFATE 75 MG PO TABS
75.0000 mg | ORAL_TABLET | Freq: Every day | ORAL | Status: DC
  Administered 2022-10-15 – 2022-10-16 (×2): 75 mg via ORAL
  Filled 2022-10-14 (×2): qty 1

## 2022-10-14 MED ORDER — INSULIN ASPART 100 UNIT/ML IJ SOLN: 0.0000 [IU] | Freq: Three times a day (TID) | INTRAMUSCULAR | Status: DC

## 2022-10-14 NOTE — Progress Notes (Signed)
PROGRESS NOTE    Stanley Nielsen  ZOX:[id number] DOB: Jun 02, 1941 DOA: 10/12/2022 PCP: Florentina Jenny, MD   Brief Narrative:   82 y.o. Caucasian male with medical history significant for dementia with behavioral disturbance, depression, stage III chronic kidney disease, gout, hypertension, and OSA, who presented to the emergency room with acute onset of right leg erythema and pain as well as possible UTI in addition to a "heart ache" that occurred earlier today.  Patient was diagnosed with NSTEMI started on IV heparin.  There was also concerns of possible pneumonia and right lower extremity cellulitis therefore on doxycycline.     Assessment & Plan:  Principal Problem:   NSTEMI (non-ST elevated myocardial infarction) Active Problems:   Cellulitis of right anterior lower leg   Community acquired pneumonia of left lower lobe of lung   Dementia with behavioral disturbance   Type 2 diabetes mellitus without complications   Essential hypertension   Depression   BPH (benign prostatic hyperplasia)   Seizure disorder     NSTEMI (non-ST elevated myocardial infarction) [facility] cardiology following.  Currently on heparin drip.  Continue aspirin, statin - Echo shows preserved EF with grade 1 DD LDL 75 Check a1c   Cellulitis of right anterior lower leg--appears resolved Lower extremity Dopplers negative for DVT.  Currently on empiric doxycycline, complete total 5-day course   Abnormal CT chest CTA showing left lower lobe opacity possible aspiration/infiltrate.  No obvious infection   type 2 diabetes mellitus without complications -Sliding scale, now ordered.    Dementia with behavioral disturbance Depression - cont psych meds    Essential hypertension - lisinopril.  IV as needed   BPH (benign prostatic hyperplasia) - continue his Flomax.       DVT prophylaxis: Heparin drip Code Status: Full code Family Communication:   Status is: Inpatient Discharge back to ALF once  cleared by cardiology       Diet Orders (From admission, onward)     Start     Ordered   10/13/22 1518  Diet Heart Room service appropriate? Yes; Fluid consistency: Thin  Diet effective now       Question Answer Comment  Room service appropriate? Yes   Fluid consistency: Thin      10/13/22 1517            Subjective: Pleasantly confused, no complaints.   Examination:  General exam: Appears calm and comfortable  Respiratory system: Clear to auscultation. Respiratory effort normal. Cardiovascular system: S1 & S2 heard, RRR. No JVD, murmurs, rubs, gallops or clicks. No pedal edema. Gastrointestinal system: Abdomen is nondistended, soft and nontender. No organomegaly or masses felt. Normal bowel sounds heard. Central nervous system: Alert and oriented to name only. No focal neurological deficits. Extremities: Symmetric 5 x 5 power. Skin: No rashes, lesions or ulcers Psychiatry: Judgement and insight appear poor  Objective: Vitals:   10/13/22 2100 10/14/22 0005 10/14/22 0351 10/14/22 0822  BP: (!) 166/90 (!) 168/90 (!) 163/77 (!) 156/71  Pulse: 73  70 68  Resp: Temp: 98.8 F (37.1 C) 98.1 F (36.7 C) 98.2 F (36.8 C) 97.9 F (36.6 C)  TempSrc: Oral  Oral   SpO2: 100% 100%  100%  Weight:      Height:        Intake/Output Summary (Last 24 hours) at 10/14/2022 0904 Last data filed at 10/14/2022 0100 Gross per 24 hour  Intake 1863.33 ml  Output 700 ml  Net 1163.33 ml   Ceasar Mons  Weights   10/12/22 1603  Weight: 79.4 kg    Scheduled Meds:  aspirin EC  81 mg Oral Daily   atorvastatin  80 mg Oral Daily   cyanocobalamin  1,000 mcg Oral Daily   divalproex  250 mg Oral TID   doxycycline  100 mg Oral Q12H   FLUoxetine  30 mg Oral Daily   guaiFENesin  600 mg Oral BID   lisinopril  5 mg Oral QHS   memantine  5 mg Oral QHS   pantoprazole  40 mg Oral Daily   risperiDONE  0.5 mg Oral BID   tamsulosin  0.4 mg Oral QHS   Continuous Infusions:  heparin  1,250 Units/hr (10/13/22 1229)    Nutritional status     Body mass index is 23.73 kg/m.  Data Reviewed:   CBC: Recent Labs  Lab 10/12/22 1606 10/13/22 0608  WBC 9.7 6.8  HGB 13.6 12.3*  HCT 41.2 37.6*  MCV 91.8 91.9  PLT 157 122*   Basic Metabolic Panel: Recent Labs  Lab 10/12/22 1606 10/13/22 0608  NA 137 136  K 3.7 4.0  CL 103 104  CO2 23 26  GLUCOSE 135* 70  BUN 21 19  CREATININE 1.24 0.90  CALCIUM 8.8* 8.0*   GFR: Estimated Creatinine Clearance: 70.7 mL/min (by C-G formula based on SCr of 0.9 mg/dL). Liver Function Tests: No results for input(s): "AST", "ALT", "ALKPHOS", "BILITOT", "PROT", "ALBUMIN" in the last 168 hours. No results for input(s): "LIPASE", "AMYLASE" in the last 168 hours. No results for input(s): "AMMONIA" in the last 168 hours. Coagulation Profile: Recent Labs  Lab 10/13/22 0639  INR 1.4*   Cardiac Enzymes: No results for input(s): "CKTOTAL", "CKMB", "CKMBINDEX", "TROPONINI" in the last 168 hours. BNP (last 3 results) No results for input(s): "PROBNP" in the last 8760 hours. HbA1C: No results for input(s): "HGBA1C" in the last 72 hours. CBG: No results for input(s): "GLUCAP" in the last 168 hours. Lipid Profile: Recent Labs    10/13/22 0608  CHOL 136  HDL 56  LDLCALC 75  TRIG 27  CHOLHDL 2.4   Thyroid Function Tests: No results for input(s): "TSH", "T4TOTAL", "FREET4", "T3FREE", "THYROIDAB" in the last 72 hours. Anemia Panel: No results for input(s): "VITAMINB12", "FOLATE", "FERRITIN", "TIBC", "IRON", "RETICCTPCT" in the last 72 hours. Sepsis Labs: Recent Labs  Lab 10/12/22 1834 10/12/22 1906  PROCALCITON 0.13  --   LATICACIDVEN  --  3.4*    Recent Results (from the past 240 hour(s))  Blood culture (routine x 2)     Status: None (Preliminary result)   Collection Time: 10/12/22  7:06 PM   Specimen: BLOOD  Result Value Ref Range Status   Specimen Description BLOOD BLOOD RIGHT FOREARM  Final   Special Requests    Final    BOTTLES DRAWN AEROBIC AND ANAEROBIC Blood Culture adequate volume   Culture   Final    NO GROWTH 2 DAYS Performed at [facility] [address], [place], [place] [address]    Report Status PENDING  Incomplete  Blood culture (routine x 2)     Status: None (Preliminary result)   Collection Time: 10/12/22  7:06 PM   Specimen: BLOOD  Result Value Ref Range Status   Specimen Description BLOOD LEFT ANTECUBITAL  Final   Special Requests   Final    BOTTLES DRAWN AEROBIC AND ANAEROBIC Blood Culture adequate volume   Culture   Final    NO GROWTH 2 DAYS Performed at [facility],  [address], [place], [place] [address]    Report Status PENDING  Incomplete         Radiology Studies: ECHOCARDIOGRAM COMPLETE  Result Date: 10/14/2022    ECHOCARDIOGRAM REPORT   Patient Name:   Stanley Nielsen Date of Exam: 10/13/2022 Medical Rec #:  [id number]             Height:       72.0 in Accession #:    [id number]            Weight:       175.0 lb Date of Birth:  18-Jan-1941            BSA:          2.013 m Patient Age:    81 years              BP:           168/142 mmHg Patient Gender: M                     HR:           77 bpm. Exam Location:  [facility] Procedure: 2D Echo, Cardiac Doppler and Color Doppler Indications:     I21.4 NSTEMI  History:         Patient has prior history of Echocardiogram examinations, most                  recent 09/21/2021. Risk Factors:Hypertension and Sleep Apnea.  Sonographer:     Daphine Deutscher RDCS Referring Phys:  [id number] Vernetta Honey MANSY Diagnosing Phys: Julien Nordmann MD IMPRESSIONS  1. Left ventricular ejection fraction, by estimation, is 55 to 60%. The left ventricle has normal function. The left ventricle has no regional wall motion abnormalities. There is mild left ventricular hypertrophy. Left ventricular diastolic parameters are consistent with Grade I diastolic dysfunction (impaired relaxation).  2. Right ventricular systolic function is  normal. The right ventricular size is normal. Tricuspid regurgitation signal is inadequate for assessing PA pressure.  3. The mitral valve is normal in structure. Mild mitral valve regurgitation. No evidence of mitral stenosis.  4. The aortic valve is normal in structure. Aortic valve regurgitation is mild. Aortic valve sclerosis/calcification is present, without any evidence of aortic stenosis.  5. The inferior vena cava is normal in size with greater than 50% respiratory variability, suggesting right atrial pressure of 3 mmHg. FINDINGS  Left Ventricle: Left ventricular ejection fraction, by estimation, is 55 to 60%. The left ventricle has normal function. The left ventricle has no regional wall motion abnormalities. The left ventricular internal cavity size was normal in size. There is  mild left ventricular hypertrophy. Left ventricular diastolic parameters are consistent with Grade I diastolic dysfunction (impaired relaxation). Right Ventricle: The right ventricular size is normal. No increase in right ventricular wall thickness. Right ventricular systolic function is normal. Tricuspid regurgitation signal is inadequate for assessing PA pressure. Left Atrium: Left atrial size was normal in size. Right Atrium: Right atrial size was normal in size. Pericardium: There is no evidence of pericardial effusion. Mitral Valve: The mitral valve is normal in structure. There is mild calcification of the mitral valve leaflet(s). Mild mitral valve regurgitation. No evidence of mitral valve stenosis. Tricuspid Valve: The tricuspid valve is normal in structure. Tricuspid valve regurgitation is not demonstrated. No evidence of tricuspid stenosis. Aortic Valve: The aortic valve is normal in structure. Aortic valve regurgitation is mild. Aortic regurgitation PHT measures 353 msec.  Aortic valve sclerosis/calcification is present, without any evidence of aortic stenosis. Pulmonic Valve: The pulmonic valve was normal in structure.  Pulmonic valve regurgitation is not visualized. No evidence of pulmonic stenosis. Aorta: The aortic root is normal in size and structure. Venous: The inferior vena cava is normal in size with greater than 50% respiratory variability, suggesting right atrial pressure of 3 mmHg. IAS/Shunts: No atrial level shunt detected by color flow Doppler.  LEFT VENTRICLE PLAX 2D LVIDd:         4.10 cm   Diastology LVIDs:         2.70 cm   LV e' medial:    5.33 cm/s LV PW:         1.30 cm   LV E/e' medial:  12.5 LV IVS:        1.20 cm   LV e' lateral:   8.70 cm/s LVOT diam:     2.20 cm   LV E/e' lateral: 7.7 LV SV:         62 LV SV Index:   31 LVOT Area:     3.80 cm  RIGHT VENTRICLE RV Basal diam:  3.30 cm RV S prime:     13.75 cm/s TAPSE (M-mode): 2.3 cm LEFT ATRIUM             Index        RIGHT ATRIUM           Index LA diam:        4.40 cm 2.19 cm/m   RA Area:     13.70 cm LA Vol (A2C):   37.5 ml 18.63 ml/m  RA Volume:   36.00 ml  17.88 ml/m LA Vol (A4C):   51.7 ml 25.68 ml/m LA Biplane Vol: 44.6 ml 22.16 ml/m  AORTIC VALVE LVOT Vmax:   87.30 cm/s LVOT Vmean:  58.333 cm/s LVOT VTI:    0.164 m AI PHT:      353 msec  AORTA Ao Root diam: 3.90 cm MITRAL VALVE MV Area (PHT): 3.34 cm     SHUNTS MV Decel Time: 227 msec     Systemic VTI:  0.16 m MV E velocity: 66.85 cm/s   Systemic Diam: 2.20 cm MV A velocity: 103.00 cm/s MV E/A ratio:  0.65 Julien Nordmann MD Electronically signed by Julien Nordmann MD Signature Date/Time: 10/14/2022/7:53:40 AM    Final    US Venous Img Lower Unilateral Right (DVT)  Result Date: 10/13/2022 CLINICAL DATA:  Right lower extremity swelling EXAM: RIGHT LOWER EXTREMITY VENOUS DOPPLER ULTRASOUND TECHNIQUE: Gray-scale sonography with compression, as well as color and duplex ultrasound, were performed to evaluate the deep venous system(s) from the level of the common femoral vein through the popliteal and proximal calf veins. COMPARISON:  None Available. FINDINGS: VENOUS Normal compressibility of the  common femoral, superficial femoral, and popliteal veins, as well as the visualized calf veins. Visualized portions of profunda femoral vein and great saphenous vein unremarkable. No filling defects to suggest DVT on grayscale or color Doppler imaging. Doppler waveforms show normal direction of venous flow, normal respiratory plasticity and response to augmentation. Limited views of the contralateral common femoral vein are unremarkable. OTHER Right popliteal fossa cyst measuring 3.2 x 2.9 x 0.9 cm. Limitations: none IMPRESSION: No evidence of right lower extremity DVT. Right Baker's cyst. Electronically Signed   By: Charlett Nose M.D.   On: 10/13/2022 00:23   CT Angio Chest PE W and/or Wo Contrast  Result Date: 10/12/2022 CLINICAL DATA:  Pulmonary embolus suspected with high probability. Leg pain and difficulty ambulating. EXAM: CT ANGIOGRAPHY CHEST WITH CONTRAST TECHNIQUE: Multidetector CT imaging of the chest was performed using the standard protocol during bolus administration of intravenous contrast. Multiplanar CT image reconstructions and MIPs were obtained to evaluate the vascular anatomy. RADIATION DOSE REDUCTION: This exam was performed according to the departmental dose-optimization program which includes automated exposure control, adjustment of the mA and/or kV according to patient size and/or use of iterative reconstruction technique. CONTRAST:  75mL OMNIPAQUE IOHEXOL 350 MG/ML SOLN COMPARISON:  Chest radiograph 10/12/2022 FINDINGS: Cardiovascular: There is good opacification of the central and segmental pulmonary arteries. No focal filling defects. No evidence of significant pulmonary embolus. Normal heart size. No pericardial effusions. Normal caliber thoracic aorta. Calcification of the aorta and coronary arteries. Mediastinum/Nodes: Esophagus is decompressed. Thyroid gland is unremarkable. No significant lymphadenopathy. Lungs/Pleura: Peribronchial infiltration in the left lower lung and left  lingula suggesting pneumonia or possibly aspiration. No pleural effusions. No pneumothorax. Upper Abdomen: Increased density in the gallbladder may represent vicarious excretion of contrast material, sludge, or tiny stones/milk of calcium. No wall thickening or inflammatory stranding identified. Musculoskeletal: Degenerative changes in the spine. Review of the MIP images confirms the above findings. IMPRESSION: 1. No evidence of significant pulmonary embolus. 2. Peribronchial infiltrates in the left lower lung and lingula likely representing pneumonia or possibly aspiration. 3. Increased density in the gallbladder suggesting vicarious excretion, sludge, or tiny stones/milk of calcium. Electronically Signed   By: Burman Nieves M.D.   On: 10/12/2022 18:15   DG Chest 2 View  Result Date: 10/12/2022 CLINICAL DATA:  Chest pain. EXAM: CHEST - 2 VIEW COMPARISON:  07/29/2022. FINDINGS: Clear lungs. Normal heart size and mediastinal contours. No pleural effusion or pneumothorax. Visualized bones and upper abdomen are unremarkable. IMPRESSION: No evidence of acute cardiopulmonary disease. Electronically Signed   By: Orvan Falconer M.D.   On: 10/12/2022 16:44           LOS: 2 days   Time spent= 35 mins    Aubrii Sharpless Joline Maxcy, MD [facility]  If 7PM-7AM, please contact night-coverage  10/14/2022, 9:04 AM

## 2022-10-14 NOTE — Consult Note (Signed)
ANTICOAGULATION CONSULT NOTE - Initial Consult  Pharmacy Consult for Heparin  Indication: chest pain/ACS  Allergies  Allergen Reactions   Penicillins Hives    Has patient had a PCN reaction causing immediate rash, facial/tongue/throat swelling, SOB or lightheadedness with hypotension: No Has patient had a PCN reaction causing severe rash involving mucus membranes or skin necrosis: No Has patient had a PCN reaction that required hospitalization: No Has patient had a PCN reaction occurring within the last 10 years: No If all of the above answers are "NO", then may proceed with Cephalosporin use.   Sulfa Antibiotics Nausea And Vomiting    Patient Measurements: Height: 6' (182.9 cm) Weight: 79.4 kg (175 lb) IBW/kg (Calculated) : 77.6 Heparin Dosing Weight: 79.4 kg  Vital Signs: Temp: 98.1 F (36.7 C) (04/24 0005) Temp Source: Oral (04/23 2100) BP: 168/90 (04/24 0005) Pulse Rate: 73 (04/23 2100)  Labs: Recent Labs    10/12/22 1606 10/12/22 1906 10/12/22 2245 10/13/22 0608 10/13/22 0639 10/13/22 1544 10/14/22 0009  HGB 13.6  --   --  12.3*  --   --   --   HCT 41.2  --   --  37.6*  --   --   --   PLT 157  --   --  122*  --   --   --   APTT  --   --   --   --  102*  --   --   LABPROT  --   --   --   --  16.7*  --   --   INR  --   --   --   --  1.4*  --   --   HEPARINUNFRC  --   --   --   --  0.18* 0.56 0.41  CREATININE 1.24  --   --  0.90  --   --   --   TROPONINIHS 692* 905* 721*  --   --   --   --      Estimated Creatinine Clearance: 70.7 mL/min (by C-G formula based on SCr of 0.9 mg/dL).   Medical History: Past Medical History:  Diagnosis Date   CKD (chronic kidney disease), stage III    Dementia    Depression    Gout    HOH (hard of hearing)    Hypertension    Sleep apnea    Vertigo     Medications:  Medications Prior to Admission  Medication Sig Dispense Refill Last Dose   cyanocobalamin (VITAMIN B12) 1000 MCG tablet Take 1,000 mcg by mouth daily.    10/12/2022 at 0530   divalproex (DEPAKOTE SPRINKLE) 125 MG capsule Take 250 mg by mouth 3 (three) times daily.   10/12/2022 at 1515   FLUoxetine (PROZAC) 10 MG capsule Take 10 mg by mouth daily. (Take with  capsule to equal  total)   10/12/2022 at 0530   FLUoxetine (PROZAC) 20 MG capsule Take 20 mg by mouth daily. (Take with  capsule to equal  total)   10/12/2022 at 0530   lisinopril (ZESTRIL) 5 MG tablet Take 5 mg by mouth at bedtime.   10/11/2022 at 1930   melatonin 5 MG TABS Take 5 mg by mouth at bedtime.   10/11/2022 at 1730   memantine (NAMENDA) 5 MG tablet Take 5 mg by mouth at bedtime.   10/11/2022 at 1730   Multiple Vitamin (DAILY VITE PO) Take 1 tablet by mouth daily.   10/12/2022 at 0530  omeprazole (PRILOSEC) 20 MG capsule Take 20 mg by mouth in the morning.   10/12/2022 at 0530   oxymetazoline (AFRIN) 0.05 % nasal spray Place 2 sprays into both nostrils daily as needed (nosebleed).   Past Week at PRN   ramelteon (ROZEREM) 8 MG tablet Take 8 mg by mouth at bedtime.   10/11/2022 at 1730   risperiDONE (RISPERDAL) 0.5 MG tablet Take 0.5 mg by mouth 2 (two) times daily.   10/12/2022 at 0530   sennosides-docusate sodium (SENOKOT-S) 8.6-50 MG tablet Take 2 tablets by mouth at bedtime as needed for constipation.   Unknown at PRN   tamsulosin (FLOMAX) 0.4 MG CAPS capsule Take 0.4 mg by mouth at bedtime.   10/11/2022 at 1930   acetaminophen (TYLENOL) 500 MG tablet Take 500 mg by mouth 3 (three) times daily as needed for mild pain. prn   Unknown at PRN   Scheduled:   aspirin EC  81 mg Oral Daily   atorvastatin  80 mg Oral Daily   cyanocobalamin  1,000 mcg Oral Daily   divalproex  250 mg Oral TID   doxycycline  100 mg Oral Q12H   FLUoxetine  30 mg Oral Daily   guaiFENesin  600 mg Oral BID   lisinopril  5 mg Oral QHS   memantine  5 mg Oral QHS   pantoprazole  40 mg Oral Daily   risperiDONE  0.5 mg Oral BID   tamsulosin  0.4 mg Oral QHS   Infusions:   heparin 1,250 Units/hr  (10/13/22 1229)   PRN: acetaminophen, LORazepam, magnesium hydroxide, metoCLOPramide (REGLAN) injection, nitroGLYCERIN, ondansetron (ZOFRAN) IV, traZODone, ziprasidone Anti-infectives (From admission, onward)    Start     Dose/Rate Route Frequency Ordered Stop   10/12/22 2200  doxycycline (VIBRA-TABS) tablet 100 mg        100 mg Oral Every 12 hours 10/12/22 1949     10/12/22 2000  cefTRIAXone (ROCEPHIN) 2 g in sodium chloride 0.9 % 100 mL IVPB  Status:  Discontinued        2 g 200 mL/hr over 30 Minutes Intravenous Every 24 hours 10/12/22 1949 10/13/22 1513   10/12/22 1945  clindamycin (CLEOCIN) capsule 300 mg        300 mg Oral  Once 10/12/22 1932 10/12/22 1953       Assessment: 82 y.o. male with a history of hypertension, stroke, diabetes, CKD, and dementia presents to the ED with leg pain and "heart ache". Trop 692. EKG: No obvious ischemia though this mild ST segment depression noted in V4 and V5. Stroke was back in 2018. No DOAC PTA. No PE. CBC stable.   Goal of Therapy:  Heparin level 0.3-0.7 units/ml Monitor platelets by anticoagulation protocol: Yes   Plan:  4/24:  HL @ 0009 = 0.41, therapeutic X 2 - Will continue heparin drip rate at 1250 units/hr. - Will recheck HL on 4/25 with AM labs.   Ginamarie Banfield D, PharmD 10/14/2022,12:41 AM

## 2022-10-14 NOTE — Progress Notes (Signed)
Cardiology Progress Note   Patient Name: Stanley Nielsen Date of Encounter: 10/14/2022  Primary Cardiologist: Bryan Lemma, MD  Subjective   Patient complains of right shoulder pain, which seems to be chronic.  No chest pain or dyspnea.  Disoriented to time and place.  Inpatient Medications    Scheduled Meds:  aspirin EC  81 mg Oral Daily   atorvastatin  80 mg Oral Daily   [START ON 10/15/2022] clopidogrel  75 mg Oral Daily   cyanocobalamin  1,000 mcg Oral Daily   divalproex  250 mg Oral TID   doxycycline  100 mg Oral Q12H   FLUoxetine  30 mg Oral Daily   guaiFENesin  600 mg Oral BID   insulin aspart  0-9 Units Subcutaneous TID WC   lisinopril  5 mg Oral QHS   memantine  5 mg Oral QHS   pantoprazole  40 mg Oral Daily   risperiDONE  0.5 mg Oral BID   tamsulosin  0.4 mg Oral QHS   Continuous Infusions:  heparin 1,250 Units/hr (10/13/22 1229)   PRN Meds: acetaminophen, LORazepam, magnesium hydroxide, metoCLOPramide (REGLAN) injection, nitroGLYCERIN, ondansetron (ZOFRAN) IV, traZODone, ziprasidone   Vital Signs    Vitals:   10/14/22 0005 10/14/22 0351 10/14/22 0822 10/14/22 1123  BP: (!) 168/90 (!) 163/77 (!) 156/71 111/79  Pulse:  70 68 66  Resp: 18 18 16 16   Temp: 98.1 F (36.7 C) 98.2 F (36.8 C) 97.9 F (36.6 C) 97.8 F (36.6 C)  TempSrc:  Oral    SpO2: 100%  100% 100%  Weight:      Height:        Intake/Output Summary (Last 24 hours) at 10/14/2022 1324 Last data filed at 10/14/2022 0950 Gross per 24 hour  Intake 1863.33 ml  Output 700 ml  Net 1163.33 ml   Filed Weights   10/12/22 1603  Weight: 79.4 kg    Physical Exam   GEN: Well nourished, well developed, in no acute distress.  HEENT: Grossly normal.  Neck: Supple, no JVD, carotid bruits, or masses. Cardiac: RRR, 3/6 systolic ejection murmur at the upper sternal borders.  No rubs or gallops.  No clubbing, cyanosis, trace right lower leg edema.  Radials 2+, DP/PT 2+ and equal bilaterally.   Respiratory:  Respirations regular and unlabored, clear to auscultation bilaterally. GI: Soft, nontender, nondistended, BS + x 4. MS: no deformity or atrophy. Skin: warm and dry, no rash. Neuro:  Strength and sensation are intact. Psych: Normal affect.  Disoriented to time and place.  Labs    Chemistry Recent Labs  Lab 10/12/22 1606 10/13/22 0608  NA 137 136  K 3.7 4.0  CL 103 104  CO2 23 26  GLUCOSE 135* 70  BUN 21 19  CREATININE 1.24 0.90  CALCIUM 8.8* 8.0*  GFRNONAA 58* >60  ANIONGAP 11 6     Hematology Recent Labs  Lab 10/12/22 1606 10/13/22 0608  WBC 9.7 6.8  RBC 4.49 4.09*  HGB 13.6 12.3*  HCT 41.2 37.6*  MCV 91.8 91.9  MCH 30.3 30.1  MCHC 33.0 32.7  RDW 13.7 13.8  PLT 157 122*    Cardiac Enzymes  Recent Labs  Lab 10/12/22 0608 10/12/22 1606 10/12/22 1906 10/12/22 2245  TROPONINIHS 804* 692* 905* 721*      BNP    Component Value Date/Time   BNP 77.2 10/01/2021 1906    Lipids  Lab Results  Component Value Date   CHOL 136 10/13/2022   HDL 56  10/13/2022   LDLCALC 75 10/13/2022   TRIG 27 10/13/2022   CHOLHDL 2.4 10/13/2022    HbA1c  Lab Results  Component Value Date   HGBA1C 5.9 (H) 09/21/2021    Radiology    US Venous Img Lower Unilateral Right (DVT)  Result Date: 10/13/2022 CLINICAL DATA:  Right lower extremity swelling EXAM: RIGHT LOWER EXTREMITY VENOUS DOPPLER ULTRASOUND TECHNIQUE: Gray-scale sonography with compression, as well as color and duplex ultrasound, were performed to evaluate the deep venous system(s) from the level of the common femoral vein through the popliteal and proximal calf veins. COMPARISON:  None Available. FINDINGS: VENOUS Normal compressibility of the common femoral, superficial femoral, and popliteal veins, as well as the visualized calf veins. Visualized portions of profunda femoral vein and great saphenous vein unremarkable. No filling defects to suggest DVT on grayscale or color Doppler imaging. Doppler  waveforms show normal direction of venous flow, normal respiratory plasticity and response to augmentation. Limited views of the contralateral common femoral vein are unremarkable. OTHER Right popliteal fossa cyst measuring 3.2 x 2.9 x 0.9 cm. Limitations: none IMPRESSION: No evidence of right lower extremity DVT. Right Baker's cyst. Electronically Signed   By: Charlett Nose M.D.   On: 10/13/2022 00:23   CT Angio Chest PE W and/or Wo Contrast  Result Date: 10/12/2022 CLINICAL DATA:  Pulmonary embolus suspected with high probability. Leg pain and difficulty ambulating. EXAM: CT ANGIOGRAPHY CHEST WITH CONTRAST TECHNIQUE: Multidetector CT imaging of the chest was performed using the standard protocol during bolus administration of intravenous contrast. Multiplanar CT image reconstructions and MIPs were obtained to evaluate the vascular anatomy. RADIATION DOSE REDUCTION: This exam was performed according to the departmental dose-optimization program which includes automated exposure control, adjustment of the mA and/or kV according to patient size and/or use of iterative reconstruction technique. CONTRAST:  75mL OMNIPAQUE IOHEXOL 350 MG/ML SOLN COMPARISON:  Chest radiograph 10/12/2022 FINDINGS: Cardiovascular: There is good opacification of the central and segmental pulmonary arteries. No focal filling defects. No evidence of significant pulmonary embolus. Normal heart size. No pericardial effusions. Normal caliber thoracic aorta. Calcification of the aorta and coronary arteries. Mediastinum/Nodes: Esophagus is decompressed. Thyroid gland is unremarkable. No significant lymphadenopathy. Lungs/Pleura: Peribronchial infiltration in the left lower lung and left lingula suggesting pneumonia or possibly aspiration. No pleural effusions. No pneumothorax. Upper Abdomen: Increased density in the gallbladder may represent vicarious excretion of contrast material, sludge, or tiny stones/milk of calcium. No wall thickening or  inflammatory stranding identified. Musculoskeletal: Degenerative changes in the spine. Review of the MIP images confirms the above findings. IMPRESSION: 1. No evidence of significant pulmonary embolus. 2. Peribronchial infiltrates in the left lower lung and lingula likely representing pneumonia or possibly aspiration. 3. Increased density in the gallbladder suggesting vicarious excretion, sludge, or tiny stones/milk of calcium. Electronically Signed   By: Burman Nieves M.D.   On: 10/12/2022 18:15   DG Chest 2 View  Result Date: 10/12/2022 CLINICAL DATA:  Chest pain. EXAM: CHEST - 2 VIEW COMPARISON:  07/29/2022. FINDINGS: Clear lungs. Normal heart size and mediastinal contours. No pleural effusion or pneumothorax. Visualized bones and upper abdomen are unremarkable. IMPRESSION: No evidence of acute cardiopulmonary disease. Electronically Signed   By: Orvan Falconer M.D.   On: 10/12/2022 16:44    Telemetry    RSR, brief run of SVT - Personally Reviewed  Cardiac Studies   2D Echocardiogram 4.23.2024   1. Left ventricular ejection fraction, by estimation, is 55 to 60%. The  left ventricle has normal  function. The left ventricle has no regional  wall motion abnormalities. There is mild left ventricular hypertrophy.  Left ventricular diastolic parameters  are consistent with Grade I diastolic dysfunction (impaired relaxation).   2. Right ventricular systolic function is normal. The right ventricular  size is normal. Tricuspid regurgitation signal is inadequate for assessing  PA pressure.   3. The mitral valve is normal in structure. Mild mitral valve  regurgitation. No evidence of mitral stenosis.   4. The aortic valve is normal in structure. Aortic valve regurgitation is  mild. Aortic valve sclerosis/calcification is present, without any  evidence of aortic stenosis.   5. The inferior vena cava is normal in size with greater than 50%  respiratory variability, suggesting right atrial  pressure of 3 mmHg.   Patient Profile     82 y.o. male with a history of hypertension, stage III chronic kidney disease, dementia, depression, and sleep apnea, who was admitted April 22 from skilled nursing facility with further alteration of mental status and concern for UTI and cellulitis, he was noted to have elevated troponin.  Assessment & Plan    1.  Non-STEMI: Patient without prior history of CAD with normal LV function by echo in 2023.  He presented from skilled nursing facility in the setting of concern related to right lower extremity erythema and possible UTI.  Apparently complained of chest pain was noted to have elevated troponin of 804  692  905  721.  Patient currently has no complaints of chest pain.  Echocardiogram performed yesterday shows normal LV function with mild LVH, grade 1 diastolic dysfunction, mild MR and mild AI.  Given severe dementia and normal EF, recommend ongoing conservative management.  He will complete 48 hours of heparin this afternoon, at which point it can be discontinued.  Continue aspirin, statin, and ACE inhibitor.  Beta-blocker previously avoided in the setting of 3.3-second pause noted on outpatient monitoring in 2023 with questionable history of syncope.  2.  Right lower extremity cellulitis: Leg is mildly swollen with resolution of erythema this morning.  Doxycycline therapy per primary team.  Lower extremity ultrasounds negative for DVT.  3.  Stage III chronic kidney disease: Creatinine was normal yesterday.  4.  Hyperlipidemia: LDL of 75.  Continue statin.  5.  Dementia: Lives in a skilled nursing facility.  6.  Questionable history of syncope: Previously seen by electrophysiology.  Symptoms and prolonged duration of unresponsiveness not felt to be consistent with syncope/arrhythmia.  7.  Hypertension: Blood pressure was elevated earlier today but most recent of 111/79.  Continue ACE inhibitor and titrate if necessary, the previous reluctance to  titrate in the setting of orthostatic symptoms at baseline.  Signed, Nicolasa Ducking, NP  10/14/2022, 1:24 PM    For questions or updates, please contact   Please consult [email or web address] for contact info under Cardiology/STEMI.

## 2022-10-15 DIAGNOSIS — R7989 Other specified abnormal findings of blood chemistry: Secondary | ICD-10-CM | POA: Diagnosis not present

## 2022-10-15 DIAGNOSIS — E119 Type 2 diabetes mellitus without complications: Secondary | ICD-10-CM | POA: Diagnosis not present

## 2022-10-15 DIAGNOSIS — F02C 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, severe, without behavioral disturbance, psychotic disturbance, mood disturbance, and anxiety: Secondary | ICD-10-CM | POA: Diagnosis not present

## 2022-10-15 DIAGNOSIS — I214 Non-ST elevation (NSTEMI) myocardial infarction: Secondary | ICD-10-CM | POA: Diagnosis not present

## 2022-10-15 LAB — HEMOGLOBIN A1C
Hgb A1c MFr Bld: 5.5 % (ref 4.8–5.6)
Mean Plasma Glucose: 111 mg/dL

## 2022-10-15 LAB — BASIC METABOLIC PANEL
Anion gap: 10 (ref 5–15)
BUN: 30 mg/dL — ABNORMAL HIGH (ref 8–23)
CO2: 24 mmol/L (ref 22–32)
Calcium: 8.5 mg/dL — ABNORMAL LOW (ref 8.9–10.3)
Chloride: 101 mmol/L (ref 98–111)
Creatinine, Ser: 1.09 mg/dL (ref 0.61–1.24)
GFR, Estimated: >60 mL/min (ref >60)
Glucose, Bld: 109 mg/dL — ABNORMAL HIGH (ref 70–99)
Potassium: 3.7 mmol/L (ref 3.5–5.1)
Sodium: 135 mmol/L (ref 135–145)

## 2022-10-15 LAB — CBC
HCT: 35.4 % — ABNORMAL LOW (ref 39.0–52.0)
Hemoglobin: 12.1 g/dL — ABNORMAL LOW (ref 13.0–17.0)
MCH: 30.5 pg (ref 26.0–34.0)
MCHC: 34.2 g/dL (ref 30.0–36.0)
MCV: 89.2 fL (ref 80.0–100.0)
Platelets: 159 10*3/uL (ref 150–400)
RBC: 3.97 MIL/uL — ABNORMAL LOW (ref 4.22–5.81)
RDW: 13.2 % (ref 11.5–15.5)
WBC: 5 10*3/uL (ref 4.0–10.5)
nRBC: 0 % (ref 0.0–0.2)

## 2022-10-15 LAB — GLUCOSE, CAPILLARY
Glucose-Capillary: 109 mg/dL — ABNORMAL HIGH (ref 70–99)
Glucose-Capillary: 92 mg/dL (ref 70–99)
Glucose-Capillary: 86 mg/dL (ref 70–99)
Glucose-Capillary: 84 mg/dL (ref 70–99)

## 2022-10-15 LAB — CULTURE, BLOOD (ROUTINE X 2)

## 2022-10-15 MED ORDER — ENOXAPARIN SODIUM 40 MG/0.4ML IJ SOSY
40.0000 mg | PREFILLED_SYRINGE | INTRAMUSCULAR | Status: DC
  Administered 2022-10-15: 40 mg via SUBCUTANEOUS
  Filled 2022-10-15: qty 0.4

## 2022-10-15 MED ORDER — CLOPIDOGREL BISULFATE 75 MG PO TABS: 75.0000 mg | ORAL_TABLET | Freq: Every day | ORAL | Status: DC

## 2022-10-15 MED ORDER — ATORVASTATIN CALCIUM 80 MG PO TABS: 80.0000 mg | ORAL_TABLET | Freq: Every day | ORAL | Status: DC

## 2022-10-15 MED ORDER — DOXYCYCLINE HYCLATE 100 MG PO TABS: 100.0000 mg | ORAL_TABLET | Freq: Two times a day (BID) | ORAL | 0 refills | Status: AC

## 2022-10-15 MED ORDER — ASPIRIN 81 MG PO TBEC: 81.0000 mg | DELAYED_RELEASE_TABLET | Freq: Every day | ORAL | 12 refills | Status: DC

## 2022-10-15 NOTE — Progress Notes (Signed)
PROGRESS NOTE    Stanley Nielsen  WUJ:[id number] DOB: [birth year] DOA: 10/12/2022 PCP: Florentina Jenny, MD   Brief Narrative:   82 y.o. Caucasian male with medical history significant for dementia with behavioral disturbance, depression, stage III chronic kidney disease, gout, hypertension, and OSA, who presented to the emergency room with acute onset of right leg erythema and pain as well as possible UTI in addition to a "heart ache" that occurred earlier today.  Patient was diagnosed with NSTEMI started on IV heparin.  There was also concerns of possible pneumonia and right lower extremity cellulitis therefore on doxycycline.  Patient completed 48 hours of heparin drip, transition to daily Plavix.     Assessment & Plan:  Principal Problem:   NSTEMI (non-ST elevated myocardial infarction) Active Problems:   Cellulitis of right anterior lower leg   Community acquired pneumonia of left lower lobe of lung   Dementia with behavioral disturbance   Type 2 diabetes mellitus without complications   Essential hypertension   Depression   BPH (benign prostatic hyperplasia)   Seizure disorder   Elevated troponin   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, severe, without behavioral disturbance, psychotic disturbance, mood disturbance, and anxiety     NSTEMI (non-ST elevated myocardial infarction) [facility] cardiology following.  Completed 48 hours of heparin.  Now on aspirin, Plavix and statin.  Given advanced age and comorbidities, he is not a candidate for further aggressive ischemic eval per cardiology. - Echo shows preserved EF with grade 1 DD LDL 75 A1c 5.5   Cellulitis of right anterior lower leg--appears resolved Lower extremity Dopplers negative for DVT.  Currently on empiric doxycycline, complete total 5-day course.  Last day 4/26   Abnormal CT chest CTA showing left lower lobe opacity possible aspiration/infiltrate.  No obvious infection   type 2 diabetes mellitus without  complications - A1c 5.5.  Not on home meds, diet controlled   Dementia with behavioral disturbance Depression - cont psych meds-Depakote, Prozac, risperidone   Essential hypertension, uncontrolled - lisinopril.  Titrate up as necessary IV as needed   BPH (benign prostatic hyperplasia) - continue his Flomax.       DVT prophylaxis: Lovenox Code Status: Full code Family Communication:   Status is: Inpatient Discharge back to ALF       Diet Orders (From admission, onward)     Start     Ordered   10/13/22 1518  Diet Heart Room service appropriate? Yes; Fluid consistency: Thin  Diet effective now       Question Answer Comment  Room service appropriate? Yes   Fluid consistency: Thin      10/13/22 1517            Subjective: Pleasantly confused no complaints.    Examination: Constitutional: Not in acute distress Respiratory: Clear to auscultation bilaterally Cardiovascular: Normal sinus rhythm, no rubs Abdomen: Nontender nondistended good bowel sounds Musculoskeletal: No edema noted Skin: No rashes seen Neurologic: CN 2-12 grossly intact.  And nonfocal   Objective: Vitals:   10/14/22 1123 10/14/22 2055 10/14/22 2348 10/15/22 0440  BP: 111/79 (!) 171/92 (!) 163/62 (!) 164/68  Pulse: 66 83 74 70  Resp: 16 18 18 18   Temp: 97.8 F (36.6 C) (!) 97 F (36.1 C) (!) 97.4 F (36.3 C) 97.7 F (36.5 C)  TempSrc:  Axillary Oral Axillary  SpO2: 100% 99% 99% 100%  Weight:      Height:        Intake/Output Summary (Last 24 hours) at 10/15/2022  9147 Last data filed at 10/15/2022 0400 Gross per 24 hour  Intake 423.18 ml  Output 750 ml  Net -326.82 ml   Filed Weights   10/12/22 1603  Weight: 79.4 kg    Scheduled Meds:  aspirin EC  81 mg Oral Daily   atorvastatin  80 mg Oral Daily   clopidogrel  75 mg Oral Daily   cyanocobalamin  1,000 mcg Oral Daily   divalproex  250 mg Oral TID   doxycycline  100 mg Oral Q12H   FLUoxetine  30 mg Oral Daily    guaiFENesin  600 mg Oral BID   insulin aspart  0-9 Units Subcutaneous TID WC   lisinopril  5 mg Oral QHS   memantine  5 mg Oral QHS   pantoprazole  40 mg Oral Daily   risperiDONE  0.5 mg Oral BID   tamsulosin  0.4 mg Oral QHS   Continuous Infusions:    Nutritional status     Body mass index is 23.73 kg/m.  Data Reviewed:   CBC: Recent Labs  Lab 10/12/22 1606 10/13/22 0608  WBC 9.7 6.8  HGB 13.6 12.3*  HCT 41.2 37.6*  MCV 91.8 91.9  PLT 157 122*   Basic Metabolic Panel: Recent Labs  Lab 10/12/22 1606 10/13/22 0608  NA 137 136  K 3.7 4.0  CL 103 104  CO2 23 26  GLUCOSE 135* 70  BUN 21 19  CREATININE 1.24 0.90  CALCIUM 8.8* 8.0*   GFR: Estimated Creatinine Clearance: 70.7 mL/min (by C-G formula based on SCr of 0.9 mg/dL). Liver Function Tests: No results for input(s): "AST", "ALT", "ALKPHOS", "BILITOT", "PROT", "ALBUMIN" in the last 168 hours. No results for input(s): "LIPASE", "AMYLASE" in the last 168 hours. No results for input(s): "AMMONIA" in the last 168 hours. Coagulation Profile: Recent Labs  Lab 10/13/22 0639  INR 1.4*   Cardiac Enzymes: No results for input(s): "CKTOTAL", "CKMB", "CKMBINDEX", "TROPONINI" in the last 168 hours. BNP (last 3 results) No results for input(s): "PROBNP" in the last 8760 hours. HbA1C: Recent Labs    10/13/22 0608  HGBA1C 5.5   CBG: Recent Labs  Lab 10/14/22 1852 10/14/22 2102  GLUCAP 92 93   Lipid Profile: Recent Labs    10/13/22 0608  CHOL 136  HDL 56  LDLCALC 75  TRIG 27  CHOLHDL 2.4   Thyroid Function Tests: No results for input(s): "TSH", "T4TOTAL", "FREET4", "T3FREE", "THYROIDAB" in the last 72 hours. Anemia Panel: No results for input(s): "VITAMINB12", "FOLATE", "FERRITIN", "TIBC", "IRON", "RETICCTPCT" in the last 72 hours. Sepsis Labs: Recent Labs  Lab 10/12/22 1834 10/12/22 1906  PROCALCITON 0.13  --   LATICACIDVEN  --  3.4*    Recent Results (from the past 240 hour(s))  Blood  culture (routine x 2)     Status: None (Preliminary result)   Collection Time: 10/12/22  7:06 PM   Specimen: BLOOD  Result Value Ref Range Status   Specimen Description BLOOD BLOOD RIGHT FOREARM  Final   Special Requests   Final    BOTTLES DRAWN AEROBIC AND ANAEROBIC Blood Culture adequate volume   Culture   Final    NO GROWTH 3 DAYS Performed at [facility], [address], [place], [place] [address]    Report Status PENDING  Incomplete  Blood culture (routine x 2)     Status: None (Preliminary result)   Collection Time: 10/12/22  7:06 PM   Specimen: BLOOD  Result Value Ref Range Status  Specimen Description BLOOD LEFT ANTECUBITAL  Final   Special Requests   Final    BOTTLES DRAWN AEROBIC AND ANAEROBIC Blood Culture adequate volume   Culture   Final    NO GROWTH 3 DAYS Performed at [facility], [address], [place], [place] [address]    Report Status PENDING  Incomplete         Radiology Studies: ECHOCARDIOGRAM COMPLETE  Result Date: 10/14/2022    ECHOCARDIOGRAM REPORT   Patient Name:   Stanley Nielsen Blazina Date of Exam: 10/13/2022 Medical Rec #:  [id number]             Height:       72.0 in Accession #:    [id number]            Weight:       175.0 lb Date of Birth:  Jan 04, 1941            BSA:          2.013 m Patient Age:    81 years              BP:           168/142 mmHg Patient Gender: M                     HR:           77 bpm. Exam Location:  [facility] Procedure: 2D Echo, Cardiac Doppler and Color Doppler Indications:     I21.4 NSTEMI  History:         Patient has prior history of Echocardiogram examinations, most                  recent 09/21/2021. Risk Factors:Hypertension and Sleep Apnea.  Sonographer:     Daphine Deutscher RDCS Referring Phys:  [id number] Vernetta Honey MANSY Diagnosing Phys: Julien Nordmann MD IMPRESSIONS  1. Left ventricular ejection fraction, by estimation, is 55 to 60%. The left ventricle has normal function. The left ventricle has no  regional wall motion abnormalities. There is mild left ventricular hypertrophy. Left ventricular diastolic parameters are consistent with Grade I diastolic dysfunction (impaired relaxation).  2. Right ventricular systolic function is normal. The right ventricular size is normal. Tricuspid regurgitation signal is inadequate for assessing PA pressure.  3. The mitral valve is normal in structure. Mild mitral valve regurgitation. No evidence of mitral stenosis.  4. The aortic valve is normal in structure. Aortic valve regurgitation is mild. Aortic valve sclerosis/calcification is present, without any evidence of aortic stenosis.  5. The inferior vena cava is normal in size with greater than 50% respiratory variability, suggesting right atrial pressure of 3 mmHg. FINDINGS  Left Ventricle: Left ventricular ejection fraction, by estimation, is 55 to 60%. The left ventricle has normal function. The left ventricle has no regional wall motion abnormalities. The left ventricular internal cavity size was normal in size. There is  mild left ventricular hypertrophy. Left ventricular diastolic parameters are consistent with Grade I diastolic dysfunction (impaired relaxation). Right Ventricle: The right ventricular size is normal. No increase in right ventricular wall thickness. Right ventricular systolic function is normal. Tricuspid regurgitation signal is inadequate for assessing PA pressure. Left Atrium: Left atrial size was normal in size. Right Atrium: Right atrial size was normal in size. Pericardium: There is no evidence of pericardial effusion. Mitral Valve: The mitral valve is normal in structure. There is mild calcification of the mitral valve leaflet(s). Mild mitral valve regurgitation. No evidence  of mitral valve stenosis. Tricuspid Valve: The tricuspid valve is normal in structure. Tricuspid valve regurgitation is not demonstrated. No evidence of tricuspid stenosis. Aortic Valve: The aortic valve is normal in  structure. Aortic valve regurgitation is mild. Aortic regurgitation PHT measures 353 msec. Aortic valve sclerosis/calcification is present, without any evidence of aortic stenosis. Pulmonic Valve: The pulmonic valve was normal in structure. Pulmonic valve regurgitation is not visualized. No evidence of pulmonic stenosis. Aorta: The aortic root is normal in size and structure. Venous: The inferior vena cava is normal in size with greater than 50% respiratory variability, suggesting right atrial pressure of 3 mmHg. IAS/Shunts: No atrial level shunt detected by color flow Doppler.  LEFT VENTRICLE PLAX 2D LVIDd:         4.10 cm   Diastology LVIDs:         2.70 cm   LV e' medial:    5.33 cm/s LV PW:         1.30 cm   LV E/e' medial:  12.5 LV IVS:        1.20 cm   LV e' lateral:   8.70 cm/s LVOT diam:     2.20 cm   LV E/e' lateral: 7.7 LV SV:         62 LV SV Index:   31 LVOT Area:     3.80 cm  RIGHT VENTRICLE RV Basal diam:  3.30 cm RV S prime:     13.75 cm/s TAPSE (M-mode): 2.3 cm LEFT ATRIUM             Index        RIGHT ATRIUM           Index LA diam:        4.40 cm 2.19 cm/m   RA Area:     13.70 cm LA Vol (A2C):   37.5 ml 18.63 ml/m  RA Volume:   36.00 ml  17.88 ml/m LA Vol (A4C):   51.7 ml 25.68 ml/m LA Biplane Vol: 44.6 ml 22.16 ml/m  AORTIC VALVE LVOT Vmax:   87.30 cm/s LVOT Vmean:  58.333 cm/s LVOT VTI:    0.164 m AI PHT:      353 msec  AORTA Ao Root diam: 3.90 cm MITRAL VALVE MV Area (PHT): 3.34 cm     SHUNTS MV Decel Time: 227 msec     Systemic VTI:  0.16 m MV E velocity: 66.85 cm/s   Systemic Diam: 2.20 cm MV A velocity: 103.00 cm/s MV E/A ratio:  0.65 Julien Nordmann MD Electronically signed by Julien Nordmann MD Signature Date/Time: 10/14/2022/7:53:40 AM    Final            LOS: 3 days   Time spent= 35 mins    Kallon Caylor Joline Maxcy, MD [facility]  If 7PM-7AM, please contact night-coverage  10/15/2022, 8:21 AM

## 2022-10-15 NOTE — Progress Notes (Addendum)
FL2 and discharge summary faxed to [phone] Fax transmittal status "Ok."  Spoke with Judeth Cornfield to advised fax was sent.  Judeth Cornfield attempted to reach her supervisor, Joyce Gross.  She stated Joyce Gross is not in her office, but was made aware earlier patient was medically stable for discharge.  RNCM request call back to confirm discharge today. Contact information provided.    3:15pm Attempt to reach [place] at [facility]. Call transferred to VM. Message left requesting return call to confirm discharge for today. RNCM contact information provided.

## 2022-10-15 NOTE — [place] FL2 (Addendum)
[place] MEDICAID FL2 LEVEL OF CARE FORM     IDENTIFICATION  Patient Name: Stanley Nielsen Birthdate: [birth year] Sex: male Admission Date (Current Location): 10/12/2022  [place] and [place] Number:  Chiropodist and Address:  [facility], 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, severe, without behavioral disturbance, psychotic disturbance, mood disturbance, and anxiety 10/15/2022   Elevated troponin 10/14/2022   NSTEMI (non-ST elevated myocardial infarction) 10/12/2022   Depression 10/12/2022   Seizure disorder 10/12/2022   Type 2 diabetes mellitus without complications 10/12/2022   Community acquired pneumonia of left lower lobe of lung 10/12/2022   Cellulitis of right anterior lower leg 10/12/2022   LVH (left ventricular hypertrophy) 09/03/2022   Orthostatic hypotension 09/03/2022   Sinus pause 01/02/2022   SVT (supraventricular tachycardia) 01/02/2022   Sinus bradycardia 10/02/2021   Prolonged QT interval 09/21/2021   Poor dentition 09/21/2021   Impaired fasting glucose 09/21/2021   BPH (benign prostatic hyperplasia) 09/21/2021   Syncope 06/07/2021   Pain due to onychomycosis of toenails of both feet 12/18/2019   Dermatitis 12/18/2019   Diabetes mellitus type 2 in nonobese    Stage 3 chronic kidney disease    Sleep apnea    Diastolic dysfunction    Hypokalemia    GERD (gastroesophageal reflux disease) 03/26/2017   Ischemic stroke  03/26/2017   DMII (diabetes mellitus, type 2) 03/26/2017   Dementia with behavioral disturbance    Essential hypertension     Orientation RESPIRATION BLADDER Height & Weight     Self  Normal External catheter Weight: 79.4 kg Height:  6' (182.9 cm)  BEHAVIORAL SYMPTOMS/MOOD NEUROLOGICAL BOWEL NUTRITION STATUS  Other (Comment)  (n/a) Continent Diet (Heart)  AMBULATORY STATUS COMMUNICATION OF NEEDS Skin   Limited Assist Verbally Normal, Bruising (Erythema sacrum, Ecchymosis bilateral arms)                       Personal Care Assistance Level of Assistance  Bathing, Dressing Bathing Assistance: Limited assistance   Dressing Assistance: Limited assistance     Functional Limitations Info             SPECIAL CARE FACTORS FREQUENCY                       Contractures Contractures Info: Not present    Additional Factors Info  Code Status, Allergies Code Status Info: FULL Allergies Info: Penicillins, Sulfa Antibiotics           Current Medications (10/15/2022):  This is the current hospital active medication list Medication List       TAKE these medications     acetaminophen 500 MG tablet Commonly known as: TYLENOL Take 500 mg by mouth 3 (three) times daily as needed for mild pain. prn    aspirin EC 81 MG tablet Take 1 tablet (81 mg total) by mouth daily. Swallow whole. Start taking on: October 16, 2022    atorvastatin 80 MG tablet  Commonly known as: LIPITOR Take 1 tablet (80 mg total) by mouth daily. Start taking on: October 16, 2022    clopidogrel 75 MG tablet Commonly known as: PLAVIX Take 1 tablet (75 mg total) by mouth daily. Start taking on: October 16, 2022    cyanocobalamin 1000 MCG tablet Commonly known as: VITAMIN B12 Take 1,000 mcg by mouth daily.    DAILY VITE PO Take 1 tablet by mouth daily.    divalproex 125 MG capsule Commonly known as: DEPAKOTE SPRINKLE Take 250 mg by mouth 3 (three) times daily.    doxycycline 100 MG  tablet Commonly known as: VIBRA-TABS Take 1 tablet (100 mg total) by mouth every 12 (twelve) hours for 1 day.    FLUoxetine 10 MG capsule Commonly known as: PROZAC Take 10 mg by mouth daily. (Take with  capsule to equal  total)    FLUoxetine 20 MG capsule Commonly known as: PROZAC Take 20 mg by mouth daily. (Take with  capsule to equal  total)    lisinopril 5 MG tablet Commonly known as: ZESTRIL Take 5 mg by mouth at bedtime.    melatonin 5 MG Tabs Take 5 mg by mouth at bedtime.    memantine 5 MG tablet Commonly known as: NAMENDA Take 5 mg by mouth at bedtime.    omeprazole 20 MG capsule Commonly known as: PRILOSEC Take 20 mg by mouth in the morning.    oxymetazoline 0.05 % nasal spray Commonly known as: AFRIN Place 2 sprays into both nostrils daily as needed (nosebleed).    ramelteon 8 MG tablet Commonly known as: ROZEREM Take 8 mg by mouth at bedtime.    risperiDONE 0.5 MG tablet Commonly known as: RISPERDAL Take 0.5 mg by mouth 2 (two) times daily.    sennosides-docusate sodium 8.6-50 MG tablet Commonly known as: SENOKOT-S Take 2 tablets by mouth at bedtime as needed for constipation.    tamsulosin 0.4 MG Caps capsule Commonly known as: FLOMAX Take 0.4 mg by mouth at bedtime.     Discharge Medications: Please see discharge summary for a list of discharge medications.  Relevant Imaging Results:  Relevant Lab Results:   Additional Information SSN:[id number]  Truddie Hidden, RN

## 2022-10-15 NOTE — TOC Initial Note (Signed)
Transition of Care [facility]) - Initial/Assessment Note    Patient Details  Name: Stanley Nielsen MRN: [id number] Date of Birth: 03-Apr-1941  Transition of Care [facility] CM/SW Contact:    Truddie Hidden, RN Phone Number: 10/15/2022, 11:32 AM  Clinical Narrative:                 Patient is from [facility] Spoke with Judeth Cornfield at [facility] ([facility]) regarding patient discharge today. She is requesting "Vital signs and medical documentation." She was not able to specify the type of medical documentation. RNCM requested to speak with the supervisor. "Joyce Gross just went to another building." Per [facility] facility will determine if patient can come back today after "Medical paperwork" is received.         Patient Goals and CMS Choice            Expected Discharge Plan and Services         Expected Discharge Date: 10/15/22                                    Prior Living Arrangements/Services                       Activities of Daily Living Home Assistive Devices/Equipment: Dan Humphreys (specify type), Wheelchair, Eyeglasses ADL Screening (condition at time of admission) Patient's cognitive ability adequate to safely complete daily activities?: Yes Is the patient deaf or have difficulty hearing?: No Does the patient have difficulty seeing, even when wearing glasses/contacts?: No Does the patient have difficulty concentrating, remembering, or making decisions?: No Patient able to express need for assistance with ADLs?: Yes Does the patient have difficulty dressing or bathing?: Yes Independently performs ADLs?: No Communication: Independent Dressing (OT): Independent Grooming: Independent Feeding: Independent Bathing: Needs assistance Is this a change from baseline?: Pre-admission baseline Toileting: Needs assistance Is this a change from baseline?: Change from baseline, expected to last <3 days In/Out Bed: Needs assistance Is this a change from baseline?: Change from  baseline, expected to last <3 days Walks in Home: Needs assistance Is this a change from baseline?: Change from baseline, expected to last <3 days Does the patient have difficulty walking or climbing stairs?: Yes Weakness of Legs: Both Weakness of Arms/Hands: None  Permission Sought/Granted                  Emotional Assessment              Admission diagnosis:  Elevated troponin [R79.89] NSTEMI (non-ST elevated myocardial infarction) [I21.4] Skin lesion of right leg [L98.9] Aspiration pneumonia, unspecified aspiration pneumonia type, unspecified laterality, unspecified part of lung [J69.0] Patient Active Problem List   Diagnosis Date Noted   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, severe, without behavioral disturbance, psychotic disturbance, mood disturbance, and anxiety 10/15/2022   Elevated troponin 10/14/2022   NSTEMI (non-ST elevated myocardial infarction) 10/12/2022   Depression 10/12/2022   Seizure disorder 10/12/2022   Type 2 diabetes mellitus without complications 10/12/2022   Community acquired pneumonia of left lower lobe of lung 10/12/2022   Cellulitis of right anterior lower leg 10/12/2022   LVH (left ventricular hypertrophy) 09/03/2022   Orthostatic hypotension 09/03/2022   Sinus pause 01/02/2022   SVT (supraventricular tachycardia) 01/02/2022   Sinus bradycardia 10/02/2021   Prolonged QT interval 09/21/2021   Poor dentition 09/21/2021   Impaired fasting glucose 09/21/2021   BPH (benign prostatic hyperplasia) 09/21/2021  Syncope 06/07/2021   Pain due to onychomycosis of toenails of both feet 12/18/2019   Dermatitis 12/18/2019   Diabetes mellitus type 2 in nonobese    Stage 3 chronic kidney disease    Sleep apnea    Diastolic dysfunction    Hypokalemia    GERD (gastroesophageal reflux disease) 03/26/2017   Ischemic stroke 03/26/2017   DMII (diabetes mellitus, type 2) 03/26/2017   Dementia with behavioral disturbance    Essential  hypertension    PCP:  Florentina Jenny, MD Pharmacy:   [facility] - [place], [place] - [address] [place] [address] Phone: [phone] Fax: [phone]  [id number] [place], [place] - [address] AT [facility] [address] [place] [address] Phone: [phone] Fax: [phone]  [facility] - [place], [place] - [address] [place] [address] Phone: [phone] Fax: [phone]     Social Determinants of Health (SDOH) Social History: SDOH Screenings   Food Insecurity: No Food Insecurity (10/13/2022)  Housing: Low Risk  (10/13/2022)  Transportation Needs: No Transportation Needs (10/13/2022)  Utilities: Not At Risk (10/13/2022)  Tobacco Use: Low Risk  (10/13/2022)   SDOH Interventions:     Readmission Risk Interventions     No data to display

## 2022-10-15 NOTE — Discharge Summary (Addendum)
Physician Discharge Summary  Merek Niu Kamen ZOX:[id number] DOB: [birth year] DOA: 10/12/2022  PCP: Florentina Jenny, MD  Admit date: 10/12/2022 Discharge date: 10/16/2022  Admitted From: Long-term care facility Disposition: Long-term care facility  Recommendations for Outpatient Follow-up:  Follow up with PCP in 1-2 weeks Please obtain BMP/CBC in one week your next doctors visit.  Oral doxycycline, last day October 16, 2022 Aspirin and Plavix daily.  Daily Lipitor.   Discharge Condition: Stable CODE STATUS: Full code Diet recommendation: Heart healthy  Brief/Interim Summary: 82 y.o. Caucasian male with medical history significant for dementia with behavioral disturbance, depression, stage III chronic kidney disease, gout, hypertension, and OSA, who presented to the emergency room with acute onset of right leg erythema and pain as well as possible UTI in addition to a "heart ache" that occurred earlier today.  Patient was diagnosed with NSTEMI started on IV heparin.  There was also concerns of possible pneumonia and right lower extremity cellulitis therefore on doxycycline.  Patient completed 48 hours of heparin drip, transition to daily Plavix.  Over the course of hospitalization patient did very well in the hospital.  Medically stable for discharge today with recommendations as stated above.       Assessment & Plan:  Principal Problem:   NSTEMI (non-ST elevated myocardial infarction) Active Problems:   Cellulitis of right anterior lower leg   Community acquired pneumonia of left lower lobe of lung   Dementia with behavioral disturbance   Type 2 diabetes mellitus without complications   Essential hypertension   Depression   BPH (benign prostatic hyperplasia)   Seizure disorder   Elevated troponin   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, severe, without behavioral disturbance, psychotic disturbance, mood disturbance, and anxiety      NSTEMI (non-ST elevated myocardial  infarction) [facility] cardiology following.  Completed 48 hours of heparin.  Now on aspirin, Plavix and statin.  Given advanced age and comorbidities, he is not a candidate for further aggressive ischemic eval per cardiology. - Echo shows preserved EF with grade 1 DD LDL 75 A1c 5.5   Cellulitis of right anterior lower leg--appears resolved Lower extremity Dopplers negative for DVT.  Currently on empiric doxycycline, complete total 5-day course.  Last day 4/26   Abnormal CT chest CTA showing left lower lobe opacity possible aspiration/infiltrate.  No obvious infection   type 2 diabetes mellitus without complications - A1c 5.5.  Not on home meds, diet controlled   Dementia with behavioral disturbance Depression - cont psych meds-Depakote, Prozac, risperidone   Essential hypertension, uncontrolled - lisinopril.  Titrate up as necessary  outpatient   BPH (benign prostatic hyperplasia) - continue his Flomax.          Consultations: [facility] Card[facility]  Subjective: Feels ok no complaints.   Discharge Exam: Vitals:   10/16/22 0455 10/16/22 0748  BP: (!) 145/59 (!) 152/50  Pulse: 71 75  Resp: 18 20  Temp: 97.6 F (36.4 C) 97.6 F (36.4 C)  SpO2: 100% 100%   Vitals:   10/15/22 1947 10/15/22 2311 10/16/22 0455 10/16/22 0748  BP: (!) 148/61 (!) 140/67 (!) 145/59 (!) 152/50  Pulse: 82 88 71 75  Resp: 18 18 18 20   Temp: 98.1 F (36.7 C) 98 F (36.7 C) 97.6 F (36.4 C) 97.6 F (36.4 C)  TempSrc:    Oral  SpO2: 97% 100% 100% 100%  Weight:      Height:        General: Pt is alert, awake, not in acute distress  Cardiovascular: RRR, S1/S2 +, no rubs, no gallops Respiratory: CTA bilaterally, no wheezing, no rhonchi Abdominal: Soft, NT, ND, bowel sounds + Extremities: no edema, no cyanosis  Discharge Instructions   Allergies as of 10/16/2022       Reactions   Penicillins Hives   Has patient had a PCN reaction causing immediate rash, facial/tongue/throat swelling, SOB or  lightheadedness with hypotension: No Has patient had a PCN reaction causing severe rash involving mucus membranes or skin necrosis: No Has patient had a PCN reaction that required hospitalization: No Has patient had a PCN reaction occurring within the last 10 years: No If all of the above answers are "NO", then may proceed with Cephalosporin use.   Sulfa Antibiotics Nausea And Vomiting        Medication List     TAKE these medications    acetaminophen 500 MG tablet Commonly known as: TYLENOL Take 500 mg by mouth 3 (three) times daily as needed for mild pain. prn   aspirin EC 81 MG tablet Take 1 tablet (81 mg total) by mouth daily. Swallow whole.   atorvastatin 80 MG tablet Commonly known as: LIPITOR Take 1 tablet (80 mg total) by mouth daily.   clopidogrel 75 MG tablet Commonly known as: PLAVIX Take 1 tablet (75 mg total) by mouth daily.   cyanocobalamin 1000 MCG tablet Commonly known as: VITAMIN B12 Take 1,000 mcg by mouth daily.   DAILY VITE PO Take 1 tablet by mouth daily.   divalproex 125 MG capsule Commonly known as: DEPAKOTE SPRINKLE Take 250 mg by mouth 3 (three) times daily.   doxycycline 100 MG tablet Commonly known as: VIBRA-TABS Take 1 tablet (100 mg total) by mouth every 12 (twelve) hours for 1 day.   FLUoxetine 10 MG capsule Commonly known as: PROZAC Take 10 mg by mouth daily. (Take with 20mg  capsule to equal 30mg  total)   FLUoxetine 20 MG capsule Commonly known as: PROZAC Take 20 mg by mouth daily. (Take with 10mg  capsule to equal 30mg  total)   lisinopril 5 MG tablet Commonly known as: ZESTRIL Take 5 mg by mouth at bedtime.   melatonin 5 MG Tabs Take 5 mg by mouth at bedtime.   memantine 5 MG tablet Commonly known as: NAMENDA Take 5 mg by mouth at bedtime.   omeprazole 20 MG capsule Commonly known as: PRILOSEC Take 20 mg by mouth in the morning.   oxymetazoline 0.05 % nasal spray Commonly known as: AFRIN Place 2 sprays into both  nostrils daily as needed (nosebleed).   ramelteon 8 MG tablet Commonly known as: ROZEREM Take 8 mg by mouth at bedtime.   risperiDONE 0.5 MG tablet Commonly known as: RISPERDAL Take 0.5 mg by mouth 2 (two) times daily.   sennosides-docusate sodium 8.6-50 MG tablet Commonly known as: SENOKOT-S Take 2 tablets by mouth at bedtime as needed for constipation.   tamsulosin 0.4 MG Caps capsule Commonly known as: FLOMAX Take 0.4 mg by mouth at bedtime.        Follow-up Information     Florentina Jenny, MD Follow up in 1 week(s).   Specialty: Family Medicine Contact information: [address] [place] [address] [phone]         Florentina Jenny, MD Follow up in 1 week(s).   Specialty: Family Medicine Contact information: [address] [place] [address] [phone]                Allergies  Allergen Reactions  Penicillins Hives    Has patient had a PCN reaction causing immediate rash, facial/tongue/throat swelling, SOB or lightheadedness with hypotension: No Has patient had a PCN reaction causing severe rash involving mucus membranes or skin necrosis: No Has patient had a PCN reaction that required hospitalization: No Has patient had a PCN reaction occurring within the last 10 years: No If all of the above answers are "NO", then may proceed with Cephalosporin use.   Sulfa Antibiotics Nausea And Vomiting    You were cared for by a hospitalist during your hospital stay. If you have any questions about your discharge medications or the care you received while you were in the hospital after you are discharged, you can call the unit and asked to speak with the hospitalist on call if the hospitalist that took care of you is not available. Once you are discharged, your primary care physician will handle any further medical issues. Please note that no refills for any discharge medications will be authorized once you are discharged, as it  is imperative that you return to your primary care physician (or establish a relationship with a primary care physician if you do not have one) for your aftercare needs so that they can reassess your need for medications and monitor your lab values.  You were cared for by a hospitalist during your hospital stay. If you have any questions about your discharge medications or the care you received while you were in the hospital after you are discharged, you can call the unit and asked to speak with the hospitalist on call if the hospitalist that took care of you is not available. Once you are discharged, your primary care physician will handle any further medical issues. Please note that NO REFILLS for any discharge medications will be authorized once you are discharged, as it is imperative that you return to your primary care physician (or establish a relationship with a primary care physician if you do not have one) for your aftercare needs so that they can reassess your need for medications and monitor your lab values.  Please request your Prim.MD to go over all Hospital Tests and Procedure/Radiological results at the follow up, please get all Hospital records sent to your Prim MD by signing hospital release before you go home.  Get CBC, CMP, 2 view Chest X ray checked  by Primary MD during your next visit or SNF MD in 5-7 days ( we routinely change or add medications that can affect your baseline labs and fluid status, therefore we recommend that you get the mentioned basic workup next visit with your PCP, your PCP may decide not to get them or add new tests based on their clinical decision)  On your next visit with your primary care physician please Get Medicines reviewed and adjusted.  If you experience worsening of your admission symptoms, develop shortness of breath, life threatening emergency, suicidal or homicidal thoughts you must seek medical attention immediately by calling 911 or calling your MD  immediately  if symptoms less severe.  You Must read complete instructions/literature along with all the possible adverse reactions/side effects for all the Medicines you take and that have been prescribed to you. Take any new Medicines after you have completely understood and accpet all the possible adverse reactions/side effects.   Do not drive, operate heavy machinery, perform activities at heights, swimming or participation in water activities or provide baby sitting services if your were admitted for syncope or siezures until you have seen  by Primary MD or a Neurologist and advised to do so again.  Do not drive when taking Pain medications.   Procedures/Studies: US ARTERIAL ABI (SCREENING LOWER EXTREMITY)  Result Date: 10/15/2022 CLINICAL DATA:  Bilateral foot pain EXAM: NONINVASIVE PHYSIOLOGIC VASCULAR STUDY OF BILATERAL LOWER EXTREMITIES TECHNIQUE: Evaluation of both lower extremities were performed at rest, including calculation of ankle-brachial indices with single level pressure measurements and doppler recording. COMPARISON:  None available. FINDINGS: Right ABI:  1.11 Left ABI:  1.16 Right Lower Extremity:  Normal arterial waveforms at the ankle. Left Lower Extremity:  Normal arterial waveforms at the ankle. 1.0-1.4 Normal IMPRESSION: Normal ankle-brachial indices. Electronically Signed   By: Acquanetta Belling M.D.   On: 10/15/2022 08:06   ECHOCARDIOGRAM COMPLETE  Result Date: 10/14/2022    ECHOCARDIOGRAM REPORT   Patient Name:   Kshawn EDISON Sibert Date of Exam: 10/13/2022 Medical Rec #:  [id number]             Height:       72.0 in Accession #:    [id number]            Weight:       175.0 lb Date of Birth:  June 29, 1940            BSA:          2.013 m Patient Age:    81 years              BP:           168/142 mmHg Patient Gender: M                     HR:           77 bpm. Exam Location:  [facility] Procedure: 2D Echo, Cardiac Doppler and Color Doppler Indications:     I21.4 NSTEMI  History:          Patient has prior history of Echocardiogram examinations, most                  recent 09/21/2021. Risk Factors:Hypertension and Sleep Apnea.  Sonographer:     Daphine Deutscher RDCS Referring Phys:  [id number] Vernetta Honey MANSY Diagnosing Phys: Julien Nordmann MD IMPRESSIONS  1. Left ventricular ejection fraction, by estimation, is 55 to 60%. The left ventricle has normal function. The left ventricle has no regional wall motion abnormalities. There is mild left ventricular hypertrophy. Left ventricular diastolic parameters are consistent with Grade I diastolic dysfunction (impaired relaxation).  2. Right ventricular systolic function is normal. The right ventricular size is normal. Tricuspid regurgitation signal is inadequate for assessing PA pressure.  3. The mitral valve is normal in structure. Mild mitral valve regurgitation. No evidence of mitral stenosis.  4. The aortic valve is normal in structure. Aortic valve regurgitation is mild. Aortic valve sclerosis/calcification is present, without any evidence of aortic stenosis.  5. The inferior vena cava is normal in size with greater than 50% respiratory variability, suggesting right atrial pressure of 3 mmHg. FINDINGS  Left Ventricle: Left ventricular ejection fraction, by estimation, is 55 to 60%. The left ventricle has normal function. The left ventricle has no regional wall motion abnormalities. The left ventricular internal cavity size was normal in size. There is  mild left ventricular hypertrophy. Left ventricular diastolic parameters are consistent with Grade I diastolic dysfunction (impaired relaxation). Right Ventricle: The right ventricular size is normal. No increase in right ventricular wall thickness. Right ventricular systolic function is normal. Tricuspid regurgitation  signal is inadequate for assessing PA pressure. Left Atrium: Left atrial size was normal in size. Right Atrium: Right atrial size was normal in size. Pericardium: There is no evidence of  pericardial effusion. Mitral Valve: The mitral valve is normal in structure. There is mild calcification of the mitral valve leaflet(s). Mild mitral valve regurgitation. No evidence of mitral valve stenosis. Tricuspid Valve: The tricuspid valve is normal in structure. Tricuspid valve regurgitation is not demonstrated. No evidence of tricuspid stenosis. Aortic Valve: The aortic valve is normal in structure. Aortic valve regurgitation is mild. Aortic regurgitation PHT measures 353 msec. Aortic valve sclerosis/calcification is present, without any evidence of aortic stenosis. Pulmonic Valve: The pulmonic valve was normal in structure. Pulmonic valve regurgitation is not visualized. No evidence of pulmonic stenosis. Aorta: The aortic root is normal in size and structure. Venous: The inferior vena cava is normal in size with greater than 50% respiratory variability, suggesting right atrial pressure of 3 mmHg. IAS/Shunts: No atrial level shunt detected by color flow Doppler.  LEFT VENTRICLE PLAX 2D LVIDd:         4.10 cm   Diastology LVIDs:         2.70 cm   LV e' medial:    5.33 cm/s LV PW:         1.30 cm   LV E/e' medial:  12.5 LV IVS:        1.20 cm   LV e' lateral:   8.70 cm/s LVOT diam:     2.20 cm   LV E/e' lateral: 7.7 LV SV:         62 LV SV Index:   31 LVOT Area:     3.80 cm  RIGHT VENTRICLE RV Basal diam:  3.30 cm RV S prime:     13.75 cm/s TAPSE (M-mode): 2.3 cm LEFT ATRIUM             Index        RIGHT ATRIUM           Index LA diam:        4.40 cm 2.19 cm/m   RA Area:     13.70 cm LA Vol (A2C):   37.5 ml 18.63 ml/m  RA Volume:   36.00 ml  17.88 ml/m LA Vol (A4C):   51.7 ml 25.68 ml/m LA Biplane Vol: 44.6 ml 22.16 ml/m  AORTIC VALVE LVOT Vmax:   87.30 cm/s LVOT Vmean:  58.333 cm/s LVOT VTI:    0.164 m AI PHT:      353 msec  AORTA Ao Root diam: 3.90 cm MITRAL VALVE MV Area (PHT): 3.34 cm     SHUNTS MV Decel Time: 227 msec     Systemic VTI:  0.16 m MV E velocity: 66.85 cm/s   Systemic Diam: 2.20 cm MV  A velocity: 103.00 cm/s MV E/A ratio:  0.65 Julien Nordmann MD Electronically signed by Julien Nordmann MD Signature Date/Time: 10/14/2022/7:53:40 AM    Final    US Venous Img Lower Unilateral Right (DVT)  Result Date: 10/13/2022 CLINICAL DATA:  Right lower extremity swelling EXAM: RIGHT LOWER EXTREMITY VENOUS DOPPLER ULTRASOUND TECHNIQUE: Gray-scale sonography with compression, as well as color and duplex ultrasound, were performed to evaluate the deep venous system(s) from the level of the common femoral vein through the popliteal and proximal calf veins. COMPARISON:  None Available. FINDINGS: VENOUS Normal compressibility of the common femoral, superficial femoral, and popliteal veins, as well as the visualized calf veins. Visualized portions of  profunda femoral vein and great saphenous vein unremarkable. No filling defects to suggest DVT on grayscale or color Doppler imaging. Doppler waveforms show normal direction of venous flow, normal respiratory plasticity and response to augmentation. Limited views of the contralateral common femoral vein are unremarkable. OTHER Right popliteal fossa cyst measuring 3.2 x 2.9 x 0.9 cm. Limitations: none IMPRESSION: No evidence of right lower extremity DVT. Right Baker's cyst. Electronically Signed   By: Charlett Nose M.D.   On: 10/13/2022 00:23   CT Angio Chest PE W and/or Wo Contrast  Result Date: 10/12/2022 CLINICAL DATA:  Pulmonary embolus suspected with high probability. Leg pain and difficulty ambulating. EXAM: CT ANGIOGRAPHY CHEST WITH CONTRAST TECHNIQUE: Multidetector CT imaging of the chest was performed using the standard protocol during bolus administration of intravenous contrast. Multiplanar CT image reconstructions and MIPs were obtained to evaluate the vascular anatomy. RADIATION DOSE REDUCTION: This exam was performed according to the departmental dose-optimization program which includes automated exposure control, adjustment of the mA and/or kV according  to patient size and/or use of iterative reconstruction technique. CONTRAST:  75mL OMNIPAQUE IOHEXOL 350 MG/ML SOLN COMPARISON:  Chest radiograph 10/12/2022 FINDINGS: Cardiovascular: There is good opacification of the central and segmental pulmonary arteries. No focal filling defects. No evidence of significant pulmonary embolus. Normal heart size. No pericardial effusions. Normal caliber thoracic aorta. Calcification of the aorta and coronary arteries. Mediastinum/Nodes: Esophagus is decompressed. Thyroid gland is unremarkable. No significant lymphadenopathy. Lungs/Pleura: Peribronchial infiltration in the left lower lung and left lingula suggesting pneumonia or possibly aspiration. No pleural effusions. No pneumothorax. Upper Abdomen: Increased density in the gallbladder may represent vicarious excretion of contrast material, sludge, or tiny stones/milk of calcium. No wall thickening or inflammatory stranding identified. Musculoskeletal: Degenerative changes in the spine. Review of the MIP images confirms the above findings. IMPRESSION: 1. No evidence of significant pulmonary embolus. 2. Peribronchial infiltrates in the left lower lung and lingula likely representing pneumonia or possibly aspiration. 3. Increased density in the gallbladder suggesting vicarious excretion, sludge, or tiny stones/milk of calcium. Electronically Signed   By: Burman Nieves M.D.   On: 10/12/2022 18:15   DG Chest 2 View  Result Date: 10/12/2022 CLINICAL DATA:  Chest pain. EXAM: CHEST - 2 VIEW COMPARISON:  07/29/2022. FINDINGS: Clear lungs. Normal heart size and mediastinal contours. No pleural effusion or pneumothorax. Visualized bones and upper abdomen are unremarkable. IMPRESSION: No evidence of acute cardiopulmonary disease. Electronically Signed   By: Orvan Falconer M.D.   On: 10/12/2022 16:44     The results of significant diagnostics from this hospitalization (including imaging, microbiology, ancillary and laboratory) are  listed below for reference.     Microbiology: Recent Results (from the past 240 hour(s))  Blood culture (routine x 2)     Status: None (Preliminary result)   Collection Time: 10/12/22  7:06 PM   Specimen: BLOOD  Result Value Ref Range Status   Specimen Description BLOOD BLOOD RIGHT FOREARM  Final   Special Requests   Final    BOTTLES DRAWN AEROBIC AND ANAEROBIC Blood Culture adequate volume   Culture   Final    NO GROWTH 4 DAYS Performed at [facility], [address] [place], [place] [address]    Report Status PENDING  Incomplete  Blood culture (routine x 2)     Status: None (Preliminary result)   Collection Time: 10/12/22  7:06 PM   Specimen: BLOOD  Result Value Ref Range Status   Specimen Description BLOOD LEFT ANTECUBITAL  Final   Special Requests   Final    BOTTLES DRAWN AEROBIC AND ANAEROBIC Blood Culture adequate volume   Culture   Final    NO GROWTH 4 DAYS Performed at [facility], [address] [place], [place] [address]    Report Status PENDING  Incomplete     Labs: BNP (last 3 results) No results for input(s): "BNP" in the last 8760 hours. Basic Metabolic Panel: Recent Labs  Lab 10/12/22 1606 10/13/22 0608 10/15/22 0909  NA 137 136 135  K 3.7 4.0 3.7  CL 103 104 101  CO2 23 26 24   GLUCOSE 135* 70 109*  BUN 21 19 30*  CREATININE 1.24 0.90 1.09  CALCIUM 8.8* 8.0* 8.5*   Liver Function Tests: No results for input(s): "AST", "ALT", "ALKPHOS", "BILITOT", "PROT", "ALBUMIN" in the last 168 hours. No results for input(s): "LIPASE", "AMYLASE" in the last 168 hours. No results for input(s): "AMMONIA" in the last 168 hours. CBC: Recent Labs  Lab 10/12/22 1606 10/13/22 0608 10/15/22 0909  WBC 9.7 6.8 5.0  HGB 13.6 12.3* 12.1*  HCT 41.2 37.6* 35.4*  MCV 91.8 91.9 89.2  PLT 157 122* 159   Cardiac Enzymes: No results for input(s): "CKTOTAL", "CKMB", "CKMBINDEX", "TROPONINI" in the last 168 hours. BNP: Invalid input(s):  "POCBNP" CBG: Recent Labs  Lab 10/15/22 0826 10/15/22 1113 10/15/22 1546 10/15/22 2126 10/16/22 0740  GLUCAP 109* 92 86 84 84   D-Dimer No results for input(s): "DDIMER" in the last 72 hours. Hgb A1c No results for input(s): "HGBA1C" in the last 72 hours.  Lipid Profile No results for input(s): "CHOL", "HDL", "LDLCALC", "TRIG", "CHOLHDL", "LDLDIRECT" in the last 72 hours.  Thyroid function studies No results for input(s): "TSH", "T4TOTAL", "T3FREE", "THYROIDAB" in the last 72 hours.  Invalid input(s): "FREET3" Anemia work up No results for input(s): "VITAMINB12", "FOLATE", "FERRITIN", "TIBC", "IRON", "RETICCTPCT" in the last 72 hours. Urinalysis    Component Value Date/Time   COLORURINE YELLOW (A) 07/29/2022 1844   APPEARANCEUR HAZY (A) 07/29/2022 1844   LABSPEC 1.018 07/29/2022 1844   PHURINE 6.0 07/29/2022 1844   GLUCOSEU NEGATIVE 07/29/2022 1844   HGBUR NEGATIVE 07/29/2022 1844   BILIRUBINUR NEGATIVE 07/29/2022 1844   KETONESUR 5 (A) 07/29/2022 1844   PROTEINUR 30 (A) 07/29/2022 1844   NITRITE NEGATIVE 07/29/2022 1844   LEUKOCYTESUR SMALL (A) 07/29/2022 1844   Sepsis Labs Recent Labs  Lab 10/12/22 1606 10/13/22 0608 10/15/22 0909  WBC 9.7 6.8 5.0   Microbiology Recent Results (from the past 240 hour(s))  Blood culture (routine x 2)     Status: None (Preliminary result)   Collection Time: 10/12/22  7:06 PM   Specimen: BLOOD  Result Value Ref Range Status   Specimen Description BLOOD BLOOD RIGHT FOREARM  Final   Special Requests   Final    BOTTLES DRAWN AEROBIC AND ANAEROBIC Blood Culture adequate volume   Culture   Final    NO GROWTH 4 DAYS Performed at [facility], [address] [place], [place] [address]    Report Status PENDING  Incomplete  Blood culture (routine x 2)     Status: None (Preliminary result)   Collection Time: 10/12/22  7:06 PM   Specimen: BLOOD  Result Value Ref Range Status   Specimen Description BLOOD LEFT  ANTECUBITAL  Final   Special Requests   Final    BOTTLES DRAWN AEROBIC AND ANAEROBIC Blood Culture adequate volume   Culture   Final    NO  GROWTH 4 DAYS Performed at [facility] [address] [place], [place] [address]    Report Status PENDING  Incomplete     Time coordinating discharge:  I have spent 35 minutes face to face with the patient and on the ward discussing the patients care, assessment, plan and disposition with other care givers. >50% of the time was devoted counseling the patient about the risks and benefits of treatment/Discharge disposition and coordinating care.   SIGNED:   Dimple Nanas, MD  [facility] 10/16/2022, 8:20 AM   If 7PM-7AM, please contact night-coverage

## 2022-10-15 NOTE — Progress Notes (Addendum)
Rounding Note    Patient Name: Stanley Nielsen Date of Encounter: 10/15/2022  [facility] Cardiologist: Bryan Lemma, MD   Subjective   Patient with mittens on. He denies any chest pain.   Inpatient Medications    Scheduled Meds:  aspirin EC  81 mg Oral Daily   atorvastatin  80 mg Oral Daily   clopidogrel  75 mg Oral Daily   cyanocobalamin  1,000 mcg Oral Daily   divalproex  250 mg Oral TID   doxycycline  100 mg Oral Q12H   enoxaparin (LOVENOX) injection  40 mg Subcutaneous Q24H   FLUoxetine  30 mg Oral Daily   guaiFENesin  600 mg Oral BID   insulin aspart  0-9 Units Subcutaneous TID WC   lisinopril  5 mg Oral QHS   memantine  5 mg Oral QHS   pantoprazole  40 mg Oral Daily   risperiDONE  0.5 mg Oral BID   tamsulosin  0.4 mg Oral QHS   Continuous Infusions:  PRN Meds: acetaminophen, LORazepam, magnesium hydroxide, metoCLOPramide (REGLAN) injection, nitroGLYCERIN, ondansetron (ZOFRAN) IV, traZODone, ziprasidone   Vital Signs    Vitals:   10/14/22 2055 10/14/22 2348 10/15/22 0440 10/15/22 0822  BP: (!) 171/92 (!) 163/62 (!) 164/68 (!) 120/96  Pulse: 83 74 70 74  Resp: 18 18 18 16   Temp: (!) 97 F (36.1 C) (!) 97.4 F (36.3 C) 97.7 F (36.5 C) 98 F (36.7 C)  TempSrc: Axillary Oral Axillary   SpO2: 99% 99% 100% (!) 86%  Weight:      Height:        Intake/Output Summary (Last 24 hours) at 10/15/2022 0827 Last data filed at 10/15/2022 0400 Gross per 24 hour  Intake 423.18 ml  Output 750 ml  Net -326.82 ml      10/12/2022    4:03 PM 07/29/2022    3:47 PM 04/03/2022    7:18 AM  Last 3 Weights  Weight (lbs) 175 lb 175 lb 177 lb 0.5 oz  Weight (kg) 79.379 kg 79.379 kg 80.3 kg      Telemetry    NSR PACs, HR 70-80s - Personally Reviewed  ECG    No new - Personally Reviewed  Physical Exam   GEN: No acute distress.   Neck: No JVD Cardiac: RRR, no murmurs, rubs, or gallops.  Respiratory: Clear to auscultation bilaterally. GI: Soft,  nontender, non-distended  MS: No edema; No deformity. Neuro:  Nonfocal  Psych: Normal affect   Labs    High Sensitivity Troponin:   Recent Labs  Lab 10/12/22 0608 10/12/22 1606 10/12/22 1906 10/12/22 2245  TROPONINIHS 804* 692* 905* 721*     Chemistry Recent Labs  Lab 10/12/22 1606 10/13/22 0608  NA 137 136  K 3.7 4.0  CL 103 104  CO2 23 26  GLUCOSE 135* 70  BUN 21 19  CREATININE 1.24 0.90  CALCIUM 8.8* 8.0*  GFRNONAA 58* >60  ANIONGAP 11 6    Lipids  Recent Labs  Lab 10/13/22 0608  CHOL 136  TRIG 27  HDL 56  LDLCALC 75  CHOLHDL 2.4    Hematology Recent Labs  Lab 10/12/22 1606 10/13/22 0608  WBC 9.7 6.8  RBC 4.49 4.09*  HGB 13.6 12.3*  HCT 41.2 37.6*  MCV 91.8 91.9  MCH 30.3 30.1  MCHC 33.0 32.7  RDW 13.7 13.8  PLT 157 122*   Thyroid No results for input(s): "TSH", "FREET4" in the last 168 hours.  BNPNo results for  input(s): "BNP", "PROBNP" in the last 168 hours.  DDimer No results for input(s): "DDIMER" in the last 168 hours.   Radiology    ECHOCARDIOGRAM COMPLETE  Result Date: 10/14/2022    ECHOCARDIOGRAM REPORT   Patient Name:   Stanley Nielsen Date of Exam: 10/13/2022 Medical Rec #:  [id number]             Height:       72.0 in Accession #:    [id number]            Weight:       175.0 lb Date of Birth:  December 30, 1940            BSA:          2.013 m Patient Age:    81 years              BP:           168/142 mmHg Patient Gender: M                     HR:           77 bpm. Exam Location:  [facility] Procedure: 2D Echo, Cardiac Doppler and Color Doppler Indications:     I21.4 NSTEMI  History:         Patient has prior history of Echocardiogram examinations, most                  recent 09/21/2021. Risk Factors:Hypertension and Sleep Apnea.  Sonographer:     Daphine Deutscher RDCS Referring Phys:  [id number] Vernetta Honey MANSY Diagnosing Phys: Julien Nordmann MD IMPRESSIONS  1. Left ventricular ejection fraction, by estimation, is 55 to 60%. The left ventricle  has normal function. The left ventricle has no regional wall motion abnormalities. There is mild left ventricular hypertrophy. Left ventricular diastolic parameters are consistent with Grade I diastolic dysfunction (impaired relaxation).  2. Right ventricular systolic function is normal. The right ventricular size is normal. Tricuspid regurgitation signal is inadequate for assessing PA pressure.  3. The mitral valve is normal in structure. Mild mitral valve regurgitation. No evidence of mitral stenosis.  4. The aortic valve is normal in structure. Aortic valve regurgitation is mild. Aortic valve sclerosis/calcification is present, without any evidence of aortic stenosis.  5. The inferior vena cava is normal in size with greater than 50% respiratory variability, suggesting right atrial pressure of 3 mmHg. FINDINGS  Left Ventricle: Left ventricular ejection fraction, by estimation, is 55 to 60%. The left ventricle has normal function. The left ventricle has no regional wall motion abnormalities. The left ventricular internal cavity size was normal in size. There is  mild left ventricular hypertrophy. Left ventricular diastolic parameters are consistent with Grade I diastolic dysfunction (impaired relaxation). Right Ventricle: The right ventricular size is normal. No increase in right ventricular wall thickness. Right ventricular systolic function is normal. Tricuspid regurgitation signal is inadequate for assessing PA pressure. Left Atrium: Left atrial size was normal in size. Right Atrium: Right atrial size was normal in size. Pericardium: There is no evidence of pericardial effusion. Mitral Valve: The mitral valve is normal in structure. There is mild calcification of the mitral valve leaflet(s). Mild mitral valve regurgitation. No evidence of mitral valve stenosis. Tricuspid Valve: The tricuspid valve is normal in structure. Tricuspid valve regurgitation is not demonstrated. No evidence of tricuspid stenosis.  Aortic Valve: The aortic valve is normal in structure. Aortic valve regurgitation is mild. Aortic regurgitation PHT measures  353 msec. Aortic valve sclerosis/calcification is present, without any evidence of aortic stenosis. Pulmonic Valve: The pulmonic valve was normal in structure. Pulmonic valve regurgitation is not visualized. No evidence of pulmonic stenosis. Aorta: The aortic root is normal in size and structure. Venous: The inferior vena cava is normal in size with greater than 50% respiratory variability, suggesting right atrial pressure of 3 mmHg. IAS/Shunts: No atrial level shunt detected by color flow Doppler.  LEFT VENTRICLE PLAX 2D LVIDd:         4.10 cm   Diastology LVIDs:         2.70 cm   LV e' medial:    5.33 cm/s LV PW:         1.30 cm   LV E/e' medial:  12.5 LV IVS:        1.20 cm   LV e' lateral:   8.70 cm/s LVOT diam:     2.20 cm   LV E/e' lateral: 7.7 LV SV:         62 LV SV Index:   31 LVOT Area:     3.80 cm  RIGHT VENTRICLE RV Basal diam:  3.30 cm RV S prime:     13.75 cm/s TAPSE (M-mode): 2.3 cm LEFT ATRIUM             Index        RIGHT ATRIUM           Index LA diam:        4.40 cm 2.19 cm/m   RA Area:     13.70 cm LA Vol (A2C):   37.5 ml 18.63 ml/m  RA Volume:   36.00 ml  17.88 ml/m LA Vol (A4C):   51.7 ml 25.68 ml/m LA Biplane Vol: 44.6 ml 22.16 ml/m  AORTIC VALVE LVOT Vmax:   87.30 cm/s LVOT Vmean:  58.333 cm/s LVOT VTI:    0.164 m AI PHT:      353 msec  AORTA Ao Root diam: 3.90 cm MITRAL VALVE MV Area (PHT): 3.34 cm     SHUNTS MV Decel Time: 227 msec     Systemic VTI:  0.16 m MV E velocity: 66.85 cm/s   Systemic Diam: 2.20 cm MV A velocity: 103.00 cm/s MV E/A ratio:  0.65 Julien Nordmann MD Electronically signed by Julien Nordmann MD Signature Date/Time: 10/14/2022/7:53:40 AM    Final     Cardiac Studies   2D Echocardiogram 4.23.2024    1. Left ventricular ejection fraction, by estimation, is 55 to 60%. The  left ventricle has normal function. The left ventricle has no  regional  wall motion abnormalities. There is mild left ventricular hypertrophy.  Left ventricular diastolic parameters  are consistent with Grade I diastolic dysfunction (impaired relaxation).   2. Right ventricular systolic function is normal. The right ventricular  size is normal. Tricuspid regurgitation signal is inadequate for assessing  PA pressure.   3. The mitral valve is normal in structure. Mild mitral valve  regurgitation. No evidence of mitral stenosis.   4. The aortic valve is normal in structure. Aortic valve regurgitation is  mild. Aortic valve sclerosis/calcification is present, without any  evidence of aortic stenosis.   5. The inferior vena cava is normal in size with greater than 50%  respiratory variability, suggesting right atrial pressure of 3 mmHg.   Patient Profile     82 y.o. male with a history of hypertension, stage III chronic kidney disease, dementia, depression, and sleep apnea, who was  admitted April 22 from skilled nursing facility with further alteration of mental status and concern for UTI and cellulitis, he was noted to have elevated troponin.   Assessment & Plan   NSTEMI - patient was treated conservatively and completed 48 hours IV heparin 4/24 - unclear h/o chest pain - started on Plavix and Aspirin  daily - echo showed normal LVEF - continue Lipitor  daily, lisinopril  daily - No BB with h/o 3.3 second pause seen on outpatient monitor in 2023 - possible D/C today  Right lower extremity cellulitis - improving - abx per primary team  CKD stage 3 - labs pending  HLD - LDL 75 - continue Lipitor  daily  Dementia - in SNF  Possible syncope - previously seen by EP - tele so far unremarkable - BB held for 3.3 second pause on outpatient monitoring in 2023 as above - no further work-up  HTN - BP better today   For questions or updates, please contact  [facility] Please consult [email or web address] for contact info  under       Signed, Ottie Neglia David Stall, PA-C  10/15/2022, 8:27 AM

## 2022-10-16 LAB — CULTURE, BLOOD (ROUTINE X 2)

## 2022-10-16 LAB — GLUCOSE, CAPILLARY
Glucose-Capillary: 84 mg/dL (ref 70–99)
Glucose-Capillary: 84 mg/dL (ref 70–99)

## 2022-10-16 NOTE — Plan of Care (Signed)
  Problem: Activity: Goal: Ability to tolerate increased activity will improve Outcome: Progressing   Problem: Cardiac: Goal: Ability to achieve and maintain adequate cardiovascular perfusion will improve Outcome: Progressing   Problem: Clinical Measurements: Goal: Ability to maintain clinical measurements within normal limits will improve Outcome: Progressing Goal: Will remain free from infection Outcome: Progressing Goal: Diagnostic test results will improve Outcome: Progressing Goal: Respiratory complications will improve Outcome: Progressing Goal: Cardiovascular complication will be avoided Outcome: Progressing   Problem: Activity: Goal: Risk for activity intolerance will decrease Outcome: Progressing   Problem: Nutrition: Goal: Adequate nutrition will be maintained Outcome: Progressing   Problem: Coping: Goal: Level of anxiety will decrease Outcome: Progressing   Problem: Elimination: Goal: Will not experience complications related to bowel motility Outcome: Progressing Goal: Will not experience complications related to urinary retention Outcome: Progressing   Problem: Pain Managment: Goal: General experience of comfort will improve Outcome: Progressing   Problem: Safety: Goal: Ability to remain free from injury will improve Outcome: Progressing   Problem: Skin Integrity: Goal: Risk for impaired skin integrity will decrease Outcome: Progressing   Problem: Coping: Goal: Ability to adjust to condition or change in health will improve Outcome: Progressing   Problem: Fluid Volume: Goal: Ability to maintain a balanced intake and output will improve Outcome: Progressing   Problem: Metabolic: Goal: Ability to maintain appropriate glucose levels will improve Outcome: Progressing   Problem: Nutritional: Goal: Maintenance of adequate nutrition will improve Outcome: Progressing Goal: Progress toward achieving an optimal weight will improve Outcome:  Progressing   Problem: Skin Integrity: Goal: Risk for impaired skin integrity will decrease Outcome: Progressing   Problem: Tissue Perfusion: Goal: Adequacy of tissue perfusion will improve Outcome: Progressing   Problem: Education: Goal: Understanding of cardiac disease, CV risk reduction, and recovery process will improve Outcome: Not Progressing Note: Patient experiencing confusion at this time. Will continue to educate and reorient as needed. Goal: Individualized Educational Video(s) Outcome: Not Progressing Note: Patient experiencing confusion at this time. Will continue to educate and reorient as needed.   Problem: Health Behavior/Discharge Planning: Goal: Ability to safely manage health-related needs after discharge will improve Outcome: Not Progressing Note: Patient experiencing confusion at this time. Will continue to educate and reorient as needed.   Problem: Education: Goal: Knowledge of General Education information will improve Description: Including pain rating scale, medication(s)/side effects and non-pharmacologic comfort measures Outcome: Not Progressing Note: Patient experiencing confusion at this time. Will continue to educate and reorient as needed.   Problem: Health Behavior/Discharge Planning: Goal: Ability to manage health-related needs will improve Outcome: Not Progressing Note: Patient experiencing confusion at this time. Will continue to educate and reorient as needed.   Problem: Education: Goal: Ability to describe self-care measures that may prevent or decrease complications (Diabetes Survival Skills Education) will improve Outcome: Not Progressing Note: Patient experiencing confusion at this time. Will continue to educate and reorient as needed. Goal: Individualized Educational Video(s) Outcome: Not Progressing Note: Patient experiencing confusion at this time. Will continue to educate and reorient as needed.   Problem: Health Behavior/Discharge  Planning: Goal: Ability to identify and utilize available resources and services will improve Outcome: Not Progressing Note: Patient experiencing confusion at this time. Will continue to educate and reorient as needed. Goal: Ability to manage health-related needs will improve Outcome: Not Progressing Note: Patient experiencing confusion at this time. Will continue to educate and reorient as needed.

## 2022-10-16 NOTE — Evaluation (Signed)
Occupational Therapy Evaluation Patient Details Name: Stanley Nielsen MRN: [id number] DOB: 01/25/1941 Today's Date: 10/16/2022   History of Present Illness 82 y/o male presented to ED on 10/12/22 from [facility] for leg pain and possible UTI. Found to have NSTEMI. PMH: dementia with behavioral disturbance, depression, CKD stage III, HTN, OSA. T2DM   Clinical Impression   Patient presenting with decreased Ind in self care,balance, functional mobility/transfers, endurance, and safety awareness. Per chart review, pt is resident at [facility] memory care unit. OT called and spoke to nurse tech who is familiar with pt. She reports that pt "walks freely around unit without device and needs help with bathing and dressing". Pt oriented to self only during session and needing total A of 2 for bed mobility and standing attempt from EOB. Facility reports that pt can return to memory care unit at discharge.Patient will benefit from acute OT to increase overall independence in the areas of ADLs, functional mobility, and safety awareness in order to safely discharge.     Recommendations for follow up therapy are one component of a multi-disciplinary discharge planning process, led by the attending physician.  Recommendations may be updated based on patient status, additional functional criteria and insurance authorization.   Assistance Recommended at Discharge Frequent or constant Supervision/Assistance  Patient can return home with the following Two people to help with walking and/or transfers;Two people to help with bathing/dressing/bathroom;Assistance with cooking/housework;Assist for transportation;Help with stairs or ramp for entrance    Functional Status Assessment  Patient has had a recent decline in their functional status and/or demonstrates limited ability to make significant improvements in function in a reasonable and predictable amount of time  Equipment Recommendations  None  recommended by OT       Precautions / Restrictions Precautions Precautions: Fall Precaution Comments: dementia, mittens Restrictions Weight Bearing Restrictions: No      Mobility Bed Mobility Overal bed mobility: Needs Assistance Bed Mobility: Supine to Sit, Sit to Supine     Supine to sit: Total assist, +2 for physical assistance, +2 for safety/equipment Sit to supine: Total assist, +2 for physical assistance, +2 for safety/equipment        Transfers Overall transfer level: Needs assistance Equipment used: 2 person hand held assist Transfers: Sit to/from Stand Sit to Stand: Max assist, +2 physical assistance, +2 safety/equipment           General transfer comment: assist to stand from low bed surface with HHAx2      Balance Overall balance assessment: Needs assistance Sitting-balance support: No upper extremity supported, Feet supported Sitting balance-Leahy Scale: Poor   Postural control: Left lateral lean, Posterior lean Standing balance support: Bilateral upper extremity supported Standing balance-Leahy Scale: Zero                             ADL either performed or assessed with clinical judgement   ADL Overall ADL's : Needs assistance/impaired                                       General ADL Comments: Pt needing significant assistance for self care tasks at baseline secondary to cognition but now also secondary to physical weakness     Vision Patient Visual Report: No change from baseline              Pertinent Vitals/Pain Pain Assessment Pain  Assessment: Faces Faces Pain Scale: Hurts a little bit Pain Location: with mobility but pt unable to verbalize area Pain Descriptors / Indicators: Discomfort Pain Intervention(s): Monitored during session, Repositioned     Hand Dominance Right   Extremity/Trunk Assessment Upper Extremity Assessment Upper Extremity Assessment: Generalized weakness   Lower Extremity  Assessment Lower Extremity Assessment: Generalized weakness   Cervical / Trunk Assessment Cervical / Trunk Assessment: Kyphotic   Communication Communication Communication: No difficulties   Cognition Arousal/Alertness: Lethargic Behavior During Therapy: Flat affect Overall Cognitive Status: History of cognitive impairments - at baseline                                 General Comments: hx of dementia                Home Living Family/patient expects to be discharged to:: Assisted living                                 Additional Comments: from [facility]      Prior Functioning/Environment Prior Level of Function : Needs assist             Mobility Comments: ambulate modI ADLs Comments: requires assistance for ADLs from staff        OT Problem List: Decreased strength;Decreased activity tolerance;Decreased safety awareness;Impaired balance (sitting and/or standing);Decreased knowledge of use of DME or AE      OT Treatment/Interventions: Self-care/ADL training;Therapeutic exercise;Therapeutic activities;Energy conservation;DME and/or AE instruction;Balance training;Patient/family education    OT Goals(Current goals can be found in the care plan section) Acute Rehab OT Goals Patient Stated Goal: none stated OT Goal Formulation: Patient unable to participate in goal setting Time For Goal Achievement: 10/30/22 Potential to Achieve Goals: Fair ADL Goals Pt Will Transfer to Toilet: ambulating;with min assist Pt Will Perform Toileting - Clothing Manipulation and hygiene: with min assist;sit to/from stand Pt/caregiver will Perform Home Exercise Program: Both right and left upper extremity;With theraband;With written HEP provided;With minimal assist;Increased strength  OT Frequency: Min 1X/week    Co-evaluation   Reason for Co-Treatment: For patient/therapist safety;To address functional/ADL transfers;Necessary to address  cognition/behavior during functional activity PT goals addressed during session: Mobility/safety with mobility;Balance        AM-PAC OT "6 Clicks" Daily Activity     Outcome Measure Help from another person eating meals?: A Little Help from another person taking care of personal grooming?: A Lot Help from another person toileting, which includes using toliet, bedpan, or urinal?: A Lot Help from another person bathing (including washing, rinsing, drying)?: A Lot Help from another person to put on and taking off regular upper body clothing?: A Lot Help from another person to put on and taking off regular lower body clothing?: A Lot 6 Click Score: 13   End of Session Nurse Communication: Mobility status  Activity Tolerance: Patient limited by fatigue;Patient limited by lethargy Patient left: in bed;with call bell/phone within reach;with bed alarm set  OT Visit Diagnosis: Muscle weakness (generalized) (M62.81)                Time: 1610-9604 OT Time Calculation (min): 17 min Charges:  OT General Charges $OT Visit: 1 Visit OT Evaluation $OT Eval Moderate Complexity: [address], [place], OTR/L , CBIS ascom [phone]  10/16/22, 12:55 PM

## 2022-10-16 NOTE — Progress Notes (Signed)
Patient remains medically stable for discharge Discharge summary completed 4/25, updated today on October 16 2022   Stephania Fragmin MD [facility]

## 2022-10-16 NOTE — TOC Transition Note (Addendum)
Transition of Care [facility]) - CM/SW Discharge Note   Patient Details  Name: Stanley Nielsen MRN: [id number] Date of Birth: 1940/11/05  Transition of Care [facility] CM/SW Contact:  Truddie Hidden, RN Phone Number: 10/16/2022, 10:06 AM   Clinical Narrative:    Spoke with representative at [facility], Selena Batten, regarding discharge. The supervisor was not available however she is accepting messages.  RNCM left a message regarding patient being discharge ready since yesterday and no return call from facility after three attempts.   Kim relayed message regarding discharge to Supervisor, Joyce Gross. Admission is approved for today.    10:15am Spoke with representative Aurther Loft. RNCM request to speak with Ka regarding transportation. Joyce Gross is unavailable. Left a message requesting facility transport. Waiting for reply  10:21am  Spoke with patient's cousin, [name] [phone] to advise of patient's discharge to the facility.   10:41am Updated FL2 and discharge summary faxed to [phone].   1:00pm EMS arranged for patient.  Face sheet and medical necessity printed to the floor.   TOC signing off.             Patient Goals and CMS Choice      Discharge Placement                         Discharge Plan and Services Additional resources added to the After Visit Summary for                                       Social Determinants of Health (SDOH) Interventions SDOH Screenings   Food Insecurity: No Food Insecurity (10/13/2022)  Housing: Low Risk  (10/13/2022)  Transportation Needs: No Transportation Needs (10/13/2022)  Utilities: Not At Risk (10/13/2022)  Tobacco Use: Low Risk  (10/13/2022)     Readmission Risk Interventions     No data to display

## 2022-10-16 NOTE — Evaluation (Signed)
Physical Therapy Evaluation Patient Details Name: Stanley Nielsen MRN: [id number] DOB: 1941/04/24 Today's Date: 10/16/2022  History of Present Illness  82 y/o male presented to ED on 10/12/22 from [facility] for leg pain and possible UTI. Found to have NSTEMI. PMH: dementia with behavioral disturbance, depression, CKD stage III, HTN, OSA. T2DM  Clinical Impression  Patient admitted with the above. PTA, patient from [facility] where he was modI for ambulation and required assistance for ADLs. Patient currently demonstrates generalized weakness, impaired balance, decreased activity tolerance, and impaired cognition. Following <25% of commands and easily agitated with questioning. Required totalA+2 for bed mobility and maxA+2 for sit to stand with posterior lean in standing. OT contacted [facility] and facility states they are able to provide current level of care for the patient. Patient will benefit from skilled PT services during acute stay to address listed deficits.        Recommendations for follow up therapy are one component of a multi-disciplinary discharge planning process, led by the attending physician.  Recommendations may be updated based on patient status, additional functional criteria and insurance authorization.  Follow Up Recommendations Can patient physically be transported by private vehicle: No     Assistance Recommended at Discharge Frequent or constant Supervision/Assistance  Patient can return home with the following  Two people to help with walking and/or transfers;Two people to help with bathing/dressing/bathroom;Assistance with cooking/housework;Assistance with feeding;Direct supervision/assist for medications management;Direct supervision/assist for financial management;Assist for transportation;Help with stairs or ramp for entrance    Equipment Recommendations None recommended by PT  Recommendations for Other Services       Functional  Status Assessment Patient has had a recent decline in their functional status and/or demonstrates limited ability to make significant improvements in function in a reasonable and predictable amount of time     Precautions / Restrictions Precautions Precautions: Fall Precaution Comments: dementia, mittens Restrictions Weight Bearing Restrictions: No      Mobility  Bed Mobility Overal bed mobility: Needs Assistance Bed Mobility: Supine to Sit, Sit to Supine     Supine to sit: Total assist, +2 for physical assistance, +2 for safety/equipment Sit to supine: Total assist, +2 for physical assistance, +2 for safety/equipment        Transfers Overall transfer level: Needs assistance Equipment used: 2 person hand held assist Transfers: Sit to/from Stand Sit to Stand: Max assist, +2 physical assistance, +2 safety/equipment           General transfer comment: assist to stand from low bed surface with HHAx2    Ambulation/Gait               General Gait Details: unable  Stairs            Wheelchair Mobility    Modified Rankin (Stroke Patients Only)       Balance Overall balance assessment: Needs assistance Sitting-balance support: No upper extremity supported, Feet supported Sitting balance-Leahy Scale: Poor Sitting balance - Comments: initially fair and able to maintain sitting balance with min guard. With fatigue, requires modA to maintain Postural control: Left lateral lean, Posterior lean Standing balance support: Bilateral upper extremity supported Standing balance-Leahy Scale: Zero Standing balance comment: +2 to maintain standing due to posterior lean                             Pertinent Vitals/Pain Pain Assessment Pain Assessment: Faces Faces Pain Scale: No hurt Pain Intervention(s): Monitored  during session    Home Living Family/patient expects to be discharged to:: Assisted living                   Additional Comments:  from [facility]    Prior Function Prior Level of Function : Needs assist             Mobility Comments: ambulate modI ADLs Comments: requires assistance for ADLs     Hand Dominance        Extremity/Trunk Assessment   Upper Extremity Assessment Upper Extremity Assessment: Defer to OT evaluation    Lower Extremity Assessment Lower Extremity Assessment: Generalized weakness    Cervical / Trunk Assessment Cervical / Trunk Assessment: Kyphotic  Communication   Communication: No difficulties  Cognition Arousal/Alertness: Lethargic Behavior During Therapy: Flat affect Overall Cognitive Status: History of cognitive impairments - at baseline                                 General Comments: hx of dementia        General Comments      Exercises     Assessment/Plan    PT Assessment Patient needs continued PT services  PT Problem List Decreased strength;Decreased activity tolerance;Decreased balance;Decreased mobility;Decreased cognition;Decreased knowledge of use of DME;Decreased safety awareness;Decreased knowledge of precautions;Cardiopulmonary status limiting activity       PT Treatment Interventions DME instruction;Gait training;Functional mobility training;Therapeutic activities;Therapeutic exercise;Balance training;Patient/family education;Neuromuscular re-education    PT Goals (Current goals can be found in the Care Plan section)  Acute Rehab PT Goals Patient Stated Goal: did not state PT Goal Formulation: Patient unable to participate in goal setting Time For Goal Achievement: 10/30/22 Potential to Achieve Goals: Poor    Frequency Min 2X/week     Co-evaluation PT/OT/SLP Co-Evaluation/Treatment: Yes Reason for Co-Treatment: For patient/therapist safety;To address functional/ADL transfers;Necessary to address cognition/behavior during functional activity PT goals addressed during session: Mobility/safety with  mobility;Balance         AM-PAC PT "6 Clicks" Mobility  Outcome Measure Help needed turning from your back to your side while in a flat bed without using bedrails?: Total Help needed moving from lying on your back to sitting on the side of a flat bed without using bedrails?: Total Help needed moving to and from a bed to a chair (including a wheelchair)?: Total Help needed standing up from a chair using your arms (e.g., wheelchair or bedside chair)?: Total Help needed to walk in hospital room?: Total Help needed climbing 3-5 steps with a railing? : Total 6 Click Score: 6    End of Session   Activity Tolerance: Patient tolerated treatment well Patient left: in bed;with call bell/phone within reach;with bed alarm set;with restraints reapplied Nurse Communication: Mobility status PT Visit Diagnosis: Unsteadiness on feet (R26.81);Muscle weakness (generalized) (M62.81);Other abnormalities of gait and mobility (R26.89);Difficulty in walking, not elsewhere classified (R26.2)    Time: 1610-9604 PT Time Calculation (min) (ACUTE ONLY): 14 min   Charges:   PT Evaluation $PT Eval Moderate Complexity: 1 Mod          Maylon Peppers, PT, DPT Physical Therapist - [place]  [facility]   Jabbar Palmero A Timoth Schara 10/16/2022, 12:23 PM

## 2022-10-17 LAB — CULTURE, BLOOD (ROUTINE X 2)
Culture: NO GROWTH
Special Requests: ADEQUATE

## 2022-10-18 ENCOUNTER — Other Ambulatory Visit: Payer: Self-pay

## 2022-10-18 ENCOUNTER — Emergency Department: Payer: No Typology Code available for payment source

## 2022-10-18 ENCOUNTER — Encounter: Payer: Self-pay | Admitting: Emergency Medicine

## 2022-10-18 DIAGNOSIS — S0990XA Unspecified injury of head, initial encounter: Secondary | ICD-10-CM | POA: Diagnosis present

## 2022-10-18 DIAGNOSIS — N183 Chronic kidney disease, stage 3 unspecified: Secondary | ICD-10-CM | POA: Diagnosis not present

## 2022-10-18 DIAGNOSIS — E1122 Type 2 diabetes mellitus with diabetic chronic kidney disease: Secondary | ICD-10-CM | POA: Insufficient documentation

## 2022-10-18 DIAGNOSIS — W19XXXA Unspecified fall, initial encounter: Secondary | ICD-10-CM | POA: Diagnosis not present

## 2022-10-18 DIAGNOSIS — I129 Hypertensive chronic kidney disease with stage 1 through stage 4 chronic kidney disease, or unspecified chronic kidney disease: Secondary | ICD-10-CM | POA: Insufficient documentation

## 2022-10-18 DIAGNOSIS — F0390 Unspecified dementia without behavioral disturbance: Secondary | ICD-10-CM | POA: Diagnosis not present

## 2022-10-18 NOTE — ED Triage Notes (Signed)
Patient BIB [facility] from [facility] c/o an unwitnessed fall out of bed.  Found on floor by staff face down. Patient non-communicative during triage.

## 2022-10-19 ENCOUNTER — Emergency Department
Admission: EM | Admit: 2022-10-19 | Discharge: 2022-10-19 | Disposition: A | Discharge status: Home / Self Care | Payer: No Typology Code available for payment source | Attending: Emergency Medicine | Admitting: Emergency Medicine

## 2022-10-19 ENCOUNTER — Telehealth: Payer: Self-pay | Admitting: Internal Medicine

## 2022-10-19 DIAGNOSIS — W19XXXA Unspecified fall, initial encounter: Secondary | ICD-10-CM

## 2022-10-19 DIAGNOSIS — S0990XA Unspecified injury of head, initial encounter: Secondary | ICD-10-CM

## 2022-10-19 NOTE — Telephone Encounter (Signed)
-----   Message from Dalia Heading sent at 10/15/2022  2:53 PM EDT ----- Regarding: FW: hosp follow-up  ----- Message ----- From: Marianne Sofia, PA-C Sent: 10/15/2022  10:47 AM EDT To: Cv Div Burl Triage; Cv Div Burl Scheduling Subject: hosp follow-up                                 Pt needs 2 week follow-up. Thanks!

## 2022-10-19 NOTE — Telephone Encounter (Signed)
[employer] [phone]) and spoke to staff, was transferred to someone for scheduling. No answer, left voicemail to schedule hospital follow up.

## 2022-10-19 NOTE — ED Provider Notes (Signed)
[facility] Provider Note    Event Date/Time   First MD Initiated Contact with Patient 10/19/22 [phone]     (approximate)   History   Fall   HPI  Level V caveat: Limited by dementia  Stanley Nielsen is a 82 y.o. male to the ED via EMS from [facility] with a chief complaint of unwitnessed fall.  Found on the floor facedown by staff who told EMS.  Patient was trying to get in bed.  No anticoagulant use.  Patient denies headache, vision changes, neck pain, chest pain, shortness of breath, abdominal pain, nausea, vomiting or dizziness.     Past Medical History   Past Medical History:  Diagnosis Date   CKD (chronic kidney disease), stage III (HCC)    Dementia (HCC)    Depression    Gout    HOH (hard of hearing)    Hypertension    Sleep apnea    Vertigo      Active Problem List   Patient Active Problem List   Diagnosis Date Noted   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, severe, without behavioral disturbance, psychotic disturbance, mood disturbance, and anxiety (HCC) 10/15/2022   Elevated troponin 10/14/2022   NSTEMI (non-ST elevated myocardial infarction) (HCC) 10/12/2022   Depression 10/12/2022   Seizure disorder (HCC) 10/12/2022   Type 2 diabetes mellitus without complications (HCC) 10/12/2022   Community acquired pneumonia of left lower lobe of lung 10/12/2022   Cellulitis of right anterior lower leg 10/12/2022   LVH (left ventricular hypertrophy) 09/03/2022   Orthostatic hypotension 09/03/2022   Sinus pause 01/02/2022   SVT (supraventricular tachycardia) 01/02/2022   Sinus bradycardia 10/02/2021   Prolonged QT interval 09/21/2021   Poor dentition 09/21/2021   Impaired fasting glucose 09/21/2021   BPH (benign prostatic hyperplasia) 09/21/2021   Syncope 06/07/2021   Pain due to onychomycosis of toenails of both feet 12/18/2019   Dermatitis 12/18/2019   Diabetes mellitus type 2 in nonobese [facility])    Stage 3 chronic  kidney disease (HCC)    Sleep apnea    Diastolic dysfunction    Hypokalemia    GERD (gastroesophageal reflux disease) 03/26/2017   Ischemic stroke (HCC) 03/26/2017   DMII (diabetes mellitus, type 2) (HCC) 03/26/2017   Dementia with behavioral disturbance (HCC)    Essential hypertension      Past Surgical History   Past Surgical History:  Procedure Laterality Date   CATARACT EXTRACTION W/PHACO Left 04/30/2015   Procedure: CATARACT EXTRACTION PHACO AND INTRAOCULAR LENS PLACEMENT (IOC);  Surgeon: Galen Manila, MD;  Location: [facility] ORS;  Service: Ophthalmology;  Laterality: Left;  [place] 00:47 AP% 23.5 CDE 11.16 fluid pack lot # [id number]   COLONOSCOPY       Home Medications   Prior to Admission medications   Medication Sig Start Date End Date Taking? Authorizing Provider  acetaminophen (TYLENOL) 500 MG tablet Take 500 mg by mouth 3 (three) times daily as needed for mild pain. prn    [provider]  aspirin EC 81 MG tablet Take 1 tablet (81 mg total) by mouth daily. Swallow whole. 10/16/22   Amin, Loura Halt, MD  atorvastatin (LIPITOR) 80 MG tablet Take 1 tablet (80 mg total) by mouth daily. 10/16/22   Amin, Loura Halt, MD  clopidogrel (PLAVIX) 75 MG tablet Take 1 tablet (75 mg total) by mouth daily. 10/16/22   Amin, Loura Halt, MD  cyanocobalamin (VITAMIN B12) 1000 MCG tablet Take 1,000 mcg by mouth daily.  [provider]  divalproex (DEPAKOTE SPRINKLE) 125 MG capsule Take 250 mg by mouth 3 (three) times daily.    [provider]  FLUoxetine (PROZAC) 10 MG capsule Take 10 mg by mouth daily. (Take with 20mg  capsule to equal 30mg  total)    [provider]  FLUoxetine (PROZAC) 20 MG capsule Take 20 mg by mouth daily. (Take with 10mg  capsule to equal 30mg  total)    [provider]  lisinopril (ZESTRIL) 5 MG tablet Take 5 mg by mouth at bedtime.    [provider]  melatonin 5 MG TABS Take 5 mg by mouth at bedtime.    [provider]  memantine (NAMENDA) 5 MG tablet Take 5 mg by mouth at bedtime.    [provider]  Multiple Vitamin (DAILY VITE PO) Take 1 tablet by mouth daily.    [provider]  omeprazole (PRILOSEC) 20 MG capsule Take 20 mg by mouth in the morning.    [provider]  oxymetazoline (AFRIN) 0.05 % nasal spray Place 2 sprays into both nostrils daily as needed (nosebleed).    [provider]  ramelteon (ROZEREM) 8 MG tablet Take 8 mg by mouth at bedtime.    [provider]  risperiDONE (RISPERDAL) 0.5 MG tablet Take 0.5 mg by mouth 2 (two) times daily.    [provider]  sennosides-docusate sodium (SENOKOT-S) 8.6-50 MG tablet Take 2 tablets by mouth at bedtime as needed for constipation.    [provider]  tamsulosin (FLOMAX) 0.4 MG CAPS capsule Take 0.4 mg by mouth at bedtime.    [provider]     Allergies  Penicillins and Sulfa antibiotics   Family History   Family History  Problem Relation Age of Onset   Dementia Mother    Alcoholism Father      Physical Exam  Triage Vital Signs: ED Triage Vitals  Enc Vitals Group     BP 10/18/22 2208 (!) 140/60     Pulse Rate 10/18/22 2208 64     Resp 10/18/22 2208 20     Temp 10/18/22 2210 97.8 F (36.6 C)     Temp Source 10/18/22 2210 Axillary     SpO2 10/18/22 2208 97 %     Weight 10/18/22 2208 176 lb 5.9 oz (80 kg)     Height 10/18/22 2208 6' (1.829 m)     Head Circumference --      Peak Flow --      Pain Score --      Pain Loc --      Pain Edu? --      Excl. in GC? --     Updated Vital Signs: BP (!) 140/60   Pulse 64   Temp 97.8 F (36.6 C) (Axillary)   Resp 20   Ht 6' (1.829 m)   Wt 80 kg   SpO2 97%   BMI 23.92 kg/m    General: Awake, no distress.  Speaks with this provider. CV:  RRR.  Good peripheral perfusion.  Resp:  Normal effort.  CTAB. Abd:  Nontender.  No distention.  Other:  Head is atraumatic.  PERRL.  EOMI.  Nose is  atraumatic.  No dental malocclusion.  No midline cervical spine tenderness to palpation, step-offs or deformities.  Pelvis is stable.  Full range of motion both hips without pain.   ED Results / Procedures / Treatments  Labs (all labs ordered are listed, but only abnormal results are displayed) Labs Reviewed -  No data to display   EKG  None   RADIOLOGY I have independently visualized and interpreted patient's CT scans as well as noted the radiology interpretation:  CT head: No ICH  CT cervical spine: No acute osseous injury  Official radiology report(s): CT HEAD WO CONTRAST ( )  Result Date: 10/18/2022 CLINICAL DATA:  Facial trauma EXAM: CT HEAD WITHOUT CONTRAST TECHNIQUE: Contiguous axial images were obtained from the base of the skull through the vertex without intravenous contrast. RADIATION DOSE REDUCTION: This exam was performed according to the departmental dose-optimization program which includes automated exposure control, adjustment of the mA and/or kV according to patient size and/or use of iterative reconstruction technique. COMPARISON:  Head CT 04/03/2022 FINDINGS: Brain: No evidence of acute infarction, hemorrhage, hydrocephalus, extra-axial collection or mass lesion/mass effect. Again seen is moderate diffuse atrophy and mild periventricular white matter hypodensity, likely chronic small vessel ischemic change. There are small old infarcts in the bilateral cerebellum, unchanged. Vascular: Atherosclerotic calcifications are present within the cavernous internal carotid arteries. Skull: Normal. Negative for fracture or focal lesion. Sinuses/Orbits: No acute finding. Small polyp or mucous retention cyst in the left maxillary sinus. Other: None. IMPRESSION: 1. No acute intracranial abnormality. 2. Unchanged moderate diffuse atrophy and mild chronic small vessel ischemic change. 3. Unchanged small old infarcts in the bilateral cerebellum. Electronically Signed   By: Darliss Cheney  M.D.   On: 10/18/2022 22:51   CT Cervical Spine Wo Contrast  Result Date: 10/18/2022 CLINICAL DATA:  Larey Seat out of bed EXAM: CT CERVICAL SPINE WITHOUT CONTRAST TECHNIQUE: Multidetector CT imaging of the cervical spine was performed without intravenous contrast. Multiplanar CT image reconstructions were also generated. RADIATION DOSE REDUCTION: This exam was performed according to the departmental dose-optimization program which includes automated exposure control, adjustment of the mA and/or kV according to patient size and/or use of iterative reconstruction technique. COMPARISON:  CT 04/03/2022 FINDINGS: Alignment: No subluxation.  Facet alignment is within normal limits Skull base and vertebrae: No acute fracture. No primary bone lesion or focal pathologic process. Soft tissues and spinal canal: No prevertebral fluid or swelling. No visible canal hematoma. Disc levels: Moderate diffuse disc space narrowing and degenerative osteophytes throughout the cervical spine. Facet degenerative changes at multiple levels with multilevel foraminal narrowing Upper chest: Negative. Other: None IMPRESSION: Degenerative changes of the cervical spine. No acute osseous abnormality. Electronically Signed   By: Jasmine Pang M.D.   On: 10/18/2022 22:49     PROCEDURES:  Critical Care performed: No  Procedures   MEDICATIONS ORDERED IN ED: Medications - No data to display   IMPRESSION / MDM / ASSESSMENT AND PLAN / ED COURSE  I reviewed the triage vital signs and the nursing notes.                             82 year old male presenting for unwitnessed fall, history of dementia.  Differential diagnosis includes but is not limited to ICH, SDH, cervical spine injury, etc.  I personally reviewed patient's records and note recent hospitalization for cellulitis, found to have elevated troponin not a candidate for aggressive measures.  Patient's presentation is most consistent with acute complicated illness / injury  requiring diagnostic workup.  CT imaging negative.  Patient voices no complaints of chest pain, right lower leg cellulitis appears to be resolved.  Despite his dementia, he is able to talk to me and tells me how he is feeling fine.  Do not feel patient  warrants further lab work or imaging at this time.  Strict return precautions given.  Patient verbalizes understanding and agrees with plan of care.      FINAL CLINICAL IMPRESSION(S) / ED DIAGNOSES   Final diagnoses:  Fall, initial encounter  Minor head injury, initial encounter     Rx / DC Orders   ED Discharge Orders     None        Note:  This document was prepared using Dragon voice recognition software and may include unintentional dictation errors.   Irean Hong, MD 10/19/22 [phone]

## 2022-10-19 NOTE — Discharge Instructions (Signed)
Return to the ER for worsening symptoms, persistent vomiting, lethargy or other concerns. °

## 2022-10-26 ENCOUNTER — Telehealth: Payer: Self-pay | Admitting: Cardiovascular Disease

## 2022-10-26 NOTE — Telephone Encounter (Signed)
Left message to schedule 2 week follow up appointment.

## 2022-10-26 NOTE — Telephone Encounter (Signed)
-----   Message from Sabrina L Kronbergs sent at 10/15/2022  2:53 PM EDT ----- Regarding: FW: hosp follow-up  ----- Message ----- From: Furth, Cadence H, PA-C Sent: 10/15/2022  10:47 AM EDT To: Cv Div Burl Triage; Cv Div Burl Scheduling Subject: hosp follow-up                                 Pt needs 2 week follow-up. Thanks!   

## 2022-10-27 ENCOUNTER — Telehealth: Payer: Self-pay

## 2022-10-27 NOTE — Telephone Encounter (Signed)
-----   Message from Cadence David Stall, PA-C sent at 10/15/2022 10:47 AM EDT ----- Regarding: hosp follow-up Pt needs 2 week follow-up. Thanks!

## 2022-10-27 NOTE — Telephone Encounter (Signed)
See previous message

## 2022-12-01 ENCOUNTER — Ambulatory Visit: Payer: Medicare Other | Attending: Internal Medicine | Admitting: Internal Medicine

## 2023-01-12 ENCOUNTER — Encounter: Payer: Self-pay | Admitting: Internal Medicine

## 2023-01-18 ENCOUNTER — Emergency Department
Admission: EM | Admit: 2023-01-18 | Discharge: 2023-01-18 | Disposition: A | Discharge status: Home / Self Care | Payer: No Typology Code available for payment source | Attending: Emergency Medicine | Admitting: Emergency Medicine

## 2023-01-18 ENCOUNTER — Other Ambulatory Visit: Payer: Self-pay

## 2023-01-18 ENCOUNTER — Emergency Department: Payer: No Typology Code available for payment source

## 2023-01-18 DIAGNOSIS — F0390 Unspecified dementia without behavioral disturbance: Secondary | ICD-10-CM | POA: Insufficient documentation

## 2023-01-18 DIAGNOSIS — M25551 Pain in right hip: Secondary | ICD-10-CM | POA: Diagnosis not present

## 2023-01-18 DIAGNOSIS — W19XXXA Unspecified fall, initial encounter: Secondary | ICD-10-CM | POA: Diagnosis not present

## 2023-01-18 LAB — COMPREHENSIVE METABOLIC PANEL
ALT: 37 U/L (ref 0–44)
AST: 35 U/L (ref 15–41)
Albumin: 2.9 g/dL — ABNORMAL LOW (ref 3.5–5.0)
Alkaline Phosphatase: 104 U/L (ref 38–126)
Anion gap: 11 (ref 5–15)
BUN: 16 mg/dL (ref 8–23)
CO2: 27 mmol/L (ref 22–32)
Calcium: 8.3 mg/dL — ABNORMAL LOW (ref 8.9–10.3)
Chloride: 101 mmol/L (ref 98–111)
Creatinine, Ser: 0.92 mg/dL (ref 0.61–1.24)
GFR, Estimated: >60 mL/min (ref >60)
Glucose, Bld: 102 mg/dL — ABNORMAL HIGH (ref 70–99)
Potassium: 3 mmol/L — ABNORMAL LOW (ref 3.5–5.1)
Sodium: 139 mmol/L (ref 135–145)
Total Bilirubin: 1 mg/dL (ref 0.3–1.2)
Total Protein: 6.7 g/dL (ref 6.5–8.1)

## 2023-01-18 LAB — CBC
HCT: 35.4 % — ABNORMAL LOW (ref 39.0–52.0)
Hemoglobin: 11.6 g/dL — ABNORMAL LOW (ref 13.0–17.0)
MCH: 29 pg (ref 26.0–34.0)
MCHC: 32.8 g/dL (ref 30.0–36.0)
MCV: 88.5 fL (ref 80.0–100.0)
Platelets: 159 10*3/uL (ref 150–400)
RBC: 4 MIL/uL — ABNORMAL LOW (ref 4.22–5.81)
RDW: 14 % (ref 11.5–15.5)
WBC: 4.8 10*3/uL (ref 4.0–10.5)
nRBC: 0 % (ref 0.0–0.2)

## 2023-01-18 NOTE — ED Notes (Signed)
Called [facility] x 3 no answer

## 2023-01-18 NOTE — ED Notes (Signed)
First Nurse Note: Patient to ED via [facility] from [facility] after an unwitnessed fall over the weekend. Unsure when patient fell but patient c/o right hip pain with limp per staff. Hx of dementia but at baseline per staff. Denies blood thinner.

## 2023-01-18 NOTE — ED Triage Notes (Signed)
Pt here after a fall today, pt c/o right hip pain. Pt has hx of dementia, not able to answer orientation questions accurately. Pt pleasant in triage.

## 2023-01-18 NOTE — ED Notes (Signed)
Attempted to call all numbers in emergency contact spot without success, left a voicemail on cousin's number to give call back. Pt has dementia and unable to state who he lives with- will attempt to contact family again later

## 2023-01-18 NOTE — ED Notes (Signed)
Pt resides at [facility]  Attempting to call for report and transport

## 2023-01-18 NOTE — ED Provider Notes (Signed)
[facility] Provider Note    Event Date/Time   First MD Initiated Contact with Patient 01/18/23 1050     (approximate)   History   Fall   HPI  Stanley Nielsen is a 82 y.o. male with history of dementia brought in for a unwitnessed fall over the weekend.  Reportedly has complained of some right hip pain to [employer] staff.  Patient is a poor historian but complains of no pain at this time     Physical Exam   Triage Vital Signs: ED Triage Vitals  Encounter Vitals Group     BP 01/18/23 0921 (!) 155/57     Systolic BP Percentile --      Diastolic BP Percentile --      Pulse Rate 01/18/23 0921 (!) 55     Resp 01/18/23 0921 18     Temp 01/18/23 0921 98.3 F (36.8 C)     Temp Source 01/18/23 0921 Oral     SpO2 01/18/23 0921 100 %     Weight 01/18/23 0922 80 kg (176 lb 5.9 oz)     Height 01/18/23 0922 1.829 m (6')     Head Circumference --      Peak Flow --      Pain Score 01/18/23 0922 0     Pain Loc --      Pain Education --      Exclude from Growth Chart --     Most recent vital signs: Vitals:   01/18/23 0921  BP: (!) 155/57  Pulse: (!) 55  Resp: 18  Temp: 98.3 F (36.8 C)  SpO2: 100%     General: Awake, no distress.  No evidence of head injury CV:  Good peripheral perfusion.  Small tenderness to palpation Resp:  Normal effort.  Abd:  No distention.,  Nontender Other:  Normal range of motion of the lower extremities.  No pain with axial load on both hips.   ED Results / Procedures / Treatments   Labs (all labs ordered are listed, but only abnormal results are displayed) Labs Reviewed  CBC - Abnormal; Notable for the following components:      Result Value   RBC 4.00 (*)    Hemoglobin 11.6 (*)    HCT 35.4 (*)    All other components within normal limits  COMPREHENSIVE METABOLIC PANEL - Abnormal; Notable for the following components:   Potassium 3.0 (*)    Glucose, Bld 102 (*)    Calcium 8.3 (*)    Albumin 2.9  (*)    All other components within normal limits     EKG  ED ECG REPORT I, Jene Every, the attending physician, personally viewed and interpreted this ECG.  Date: 01/18/2023  Rhythm: normal sinus rhythm QRS Axis: normal Intervals: normal ST/T Wave abnormalities: normal Narrative Interpretation: no evidence of acute ischemia    RADIOLOGY Hip x-ray viewed interpret by me, no evidence of fracture    PROCEDURES:  Critical Care performed:   Procedures   MEDICATIONS ORDERED IN ED: Medications - No data to display   IMPRESSION / MDM / ASSESSMENT AND PLAN / ED COURSE  I reviewed the triage vital signs and the nursing notes. Patient's presentation is most consistent with acute presentation with potential threat to life or bodily function.  Patient presents with unwitnessed fall as detailed above, no evidence of head injury.  Generally well-appearing and has no complaints here.  Lab work reviewed and is overall unremarkable, EKG  is unremarkable.  Chest x-ray and hip x-ray are negative for fracture of the hip or pelvis.  Appropriate for discharge at this time with outpatient follow-up as needed.        FINAL CLINICAL IMPRESSION(S) / ED DIAGNOSES   Final diagnoses:  Fall, initial encounter     Rx / DC Orders   ED Discharge Orders     None        Note:  This document was prepared using Dragon voice recognition software and may include unintentional dictation errors.   Jene Every, MD 01/18/23 [phone]

## 2023-01-25 ENCOUNTER — Other Ambulatory Visit: Payer: Self-pay

## 2023-01-25 ENCOUNTER — Emergency Department
Admission: EM | Admit: 2023-01-25 | Discharge: 2023-01-25 | Disposition: A | Discharge status: Home / Self Care | Payer: No Typology Code available for payment source | Attending: Emergency Medicine | Admitting: Emergency Medicine

## 2023-01-25 ENCOUNTER — Encounter: Payer: Self-pay | Admitting: *Deleted

## 2023-01-25 DIAGNOSIS — M79605 Pain in left leg: Secondary | ICD-10-CM | POA: Diagnosis present

## 2023-01-25 DIAGNOSIS — S80811A Abrasion, right lower leg, initial encounter: Secondary | ICD-10-CM | POA: Diagnosis not present

## 2023-01-25 DIAGNOSIS — F0390 Unspecified dementia without behavioral disturbance: Secondary | ICD-10-CM | POA: Insufficient documentation

## 2023-01-25 DIAGNOSIS — E876 Hypokalemia: Secondary | ICD-10-CM | POA: Diagnosis not present

## 2023-01-25 DIAGNOSIS — L03116 Cellulitis of left lower limb: Secondary | ICD-10-CM

## 2023-01-25 DIAGNOSIS — D649 Anemia, unspecified: Secondary | ICD-10-CM | POA: Insufficient documentation

## 2023-01-25 DIAGNOSIS — X58XXXA Exposure to other specified factors, initial encounter: Secondary | ICD-10-CM | POA: Insufficient documentation

## 2023-01-25 LAB — BASIC METABOLIC PANEL
Anion gap: 9 (ref 5–15)
BUN: 21 mg/dL (ref 8–23)
CO2: 27 mmol/L (ref 22–32)
Calcium: 8.4 mg/dL — ABNORMAL LOW (ref 8.9–10.3)
Chloride: 103 mmol/L (ref 98–111)
Creatinine, Ser: 0.94 mg/dL (ref 0.61–1.24)
GFR, Estimated: >60 mL/min (ref >60)
Glucose, Bld: 143 mg/dL — ABNORMAL HIGH (ref 70–99)
Potassium: 3.2 mmol/L — ABNORMAL LOW (ref 3.5–5.1)
Sodium: 139 mmol/L (ref 135–145)

## 2023-01-25 LAB — CBC
HCT: 35.4 % — ABNORMAL LOW (ref 39.0–52.0)
Hemoglobin: 11.5 g/dL — ABNORMAL LOW (ref 13.0–17.0)
MCH: 29.6 pg (ref 26.0–34.0)
MCHC: 32.5 g/dL (ref 30.0–36.0)
MCV: 91 fL (ref 80.0–100.0)
Platelets: 228 10*3/uL (ref 150–400)
RBC: 3.89 MIL/uL — ABNORMAL LOW (ref 4.22–5.81)
RDW: 14.3 % (ref 11.5–15.5)
WBC: 6.3 10*3/uL (ref 4.0–10.5)
nRBC: 0 % (ref 0.0–0.2)

## 2023-01-25 MED ORDER — CEPHALEXIN 500 MG PO CAPS
500.0000 mg | ORAL_CAPSULE | Freq: Once | ORAL | Status: AC
  Administered 2023-01-25: 500 mg via ORAL
  Filled 2023-01-25: qty 1

## 2023-01-25 MED ORDER — RAMELTEON 8 MG PO TABS
8.0000 mg | ORAL_TABLET | ORAL | Status: AC
  Administered 2023-01-25: 8 mg via ORAL
  Filled 2023-01-25 (×2): qty 1

## 2023-01-25 MED ORDER — RISPERIDONE 1 MG PO TABS
1.0000 mg | ORAL_TABLET | Freq: Once | ORAL | Status: AC
  Administered 2023-01-25: 1 mg via ORAL
  Filled 2023-01-25: qty 1

## 2023-01-25 MED ORDER — CEPHALEXIN 500 MG PO CAPS: 500.0000 mg | ORAL_CAPSULE | Freq: Four times a day (QID) | ORAL | 0 refills | Status: AC

## 2023-01-25 NOTE — Discharge Instructions (Addendum)
Stanley Nielsen appears to have a small area of developing skin infection called cellulitis around the abrasion on his left lower leg.  Please complete antibiotic as prescribed.  Please have him follow-up with his physician in the next 1 to 2 days for reevaluation.  Return to the ER if developing fevers nausea vomiting confusion weakness or other concerns arise

## 2023-01-25 NOTE — ED Provider Notes (Signed)
[facility] Provider Note    Event Date/Time   First MD Initiated Contact with Patient 01/25/23 1930     (approximate)   History   Leg Pain  EM caveat dementia.  I have tried to call his daughter but the phone number in [place] goes to a busy tone despite attempting to call [employer] to reach her.  I have also called and left a message but at the time of disposition have not heard back yet from Mrs. Asencion Partridge nor Mr. Sela Hilding who are listed as contacts  HPI  Stanley Nielsen is a 82 y.o. male referred from [facility] for concerns of redness on his leg.   Patient was seen for a fall just a few days ago.  He had no noted injuries at that time.  Today he presents for concerns of redness and abrasion to his left leg.  He demonstrates good range of motion of the leg denies being in pain or discomfort but again has significant dementia.    There is no report of any fever or hemodynamic instability.  Physical Exam   Triage Vital Signs: ED Triage Vitals  Encounter Vitals Group     BP 01/25/23 1643 (!) 167/69     Systolic BP Percentile --      Diastolic BP Percentile --      Pulse Rate 01/25/23 1643 68     Resp 01/25/23 1643 18     Temp 01/25/23 1643 98.2 F (36.8 C)     Temp Source 01/25/23 1643 Oral     SpO2 01/25/23 1643 95 %     Weight 01/25/23 1640 176 lb 5.9 oz (80 kg)     Height 01/25/23 1640 6' (1.829 m)     Head Circumference --      Peak Flow --      Pain Score 01/25/23 1640 0     Pain Loc --      Pain Education --      Exclude from Growth Chart --     Most recent vital signs: Vitals:   01/25/23 1643  BP: (!) 167/69  Pulse: 68  Resp: 18  Temp: 98.2 F (36.8 C)  SpO2: 95%     General: Awake, no distress.  Patient at times trying to lift himself up out of bed, difficult to redirect, but is compliant with care requests.  He moves all extremities vigorously when asked to do so.  He tells me his name but does not  recognize where he is or the year or situation. Normocephalic atraumatic CV:  Good peripheral perfusion.  Normal tones and rate Resp:  Normal effort.  Clear bilateral Abd:  No distention.  Does not appear tender in any quadrant Other:  Good range of motion of the upper extremities with normal and strong strength in all extremities including the lower extremities.  Well-perfused lower extremities.  He has small abrasions to the anterior shin of the right leg with a scab, the left leg with somewhat similar abrasion with no bony deformity or tenderness but the left leg abrasion has mild erythema about it.  Slight warmth developing.  The area is approximately 3 inches linear abrasion with mild approximately 1 cm extension of erythema around the area   ED Results / Procedures / Treatments   Labs (all labs ordered are listed, but only abnormal results are displayed) Labs Reviewed  BASIC METABOLIC PANEL - Abnormal; Notable for the following components:  Result Value   Potassium 3.2 (*)    Glucose, Bld 143 (*)    Calcium 8.4 (*)    All other components within normal limits  CBC - Abnormal; Notable for the following components:   RBC 3.89 (*)    Hemoglobin 11.5 (*)    HCT 35.4 (*)    All other components within normal limits   Labs notable for preserved renal function.  Mild hypokalemia.  Mild and chronic anemia.  Normal white count no fever.  No hypotension.  EKG     RADIOLOGY     PROCEDURES:  Critical Care performed: No  Procedures   MEDICATIONS ORDERED IN ED: Medications  risperiDONE (RISPERDAL) tablet 1 mg (1 mg Oral Given 01/25/23 1941)  ramelteon (ROZEREM) tablet 8 mg (8 mg Oral Given 01/25/23 1941)     IMPRESSION / MDM / ASSESSMENT AND PLAN / ED COURSE  I reviewed the triage vital signs and the nursing notes.                              Differential diagnosis includes, but is not limited to, cellulitis, wound healing, abrasion.  No signs or symptoms suggestive  of sepsis or complicated infection.  He demonstrates no bony tenderness in the lower extremities demonstrates good range of motion no deficits.  There is no reports of any falls or injuries.  No report of any signs or symptoms of be suggestive of severe infection or sepsis.  His lab work is reassuring.  We have attempted to contact his daughter as well as 2 other listed contacts with no callback I did leave HIPAA compliant voice messages with his to contact her in the [place].  At this juncture, patient given nighttime medication for calming, suspect some element of agitation for being here at the ER but is quite easily redirectable and has a history of known dementia with behavioral disturbance  At this point he is stable for return to his nursing facility and antibiotic prescription order  Patient's presentation is most consistent with acute complicated illness / injury requiring diagnostic workup.  History of penicillin allergy noted but review of records here indicate patient has tolerated cephalosporins without issue        FINAL CLINICAL IMPRESSION(S) / ED DIAGNOSES   Final diagnoses:  Cellulitis of left lower extremity     Rx / DC Orders   ED Discharge Orders          Ordered    cephALEXin (KEFLEX) 500 MG capsule  4 times daily        01/25/23 2016             Note:  This document was prepared using Dragon voice recognition software and may include unintentional dictation errors.   Sharyn Creamer, MD 01/25/23 2022

## 2023-01-25 NOTE — ED Triage Notes (Signed)
Pt brought in via ems from [facility].  Pt has leg pain.    Abrasion and redness to right lower leg.   Hx dementia.  Pt is poor historian.  Pt alert.

## 2023-01-25 NOTE — ED Notes (Signed)
Pt unwilling to stay in bed in triage middle. Pt constantly trying to get up out of bed and requiring EDT to pull him back up in bed very often. Pt kicking, punching, attempting to bite, and scratching at EDT as EDT was trying to wrap one of pts skin tears that was actively bleeding. Psychologist, forensic, Administrator, arts, came to assist EDT as pt was punching and kicking EDT. Verbal de-escalation ineffective and pt refused to allow anyone to touch him.

## 2023-01-29 ENCOUNTER — Emergency Department (HOSPITAL_COMMUNITY): Payer: Medicare Other

## 2023-01-29 ENCOUNTER — Emergency Department (HOSPITAL_COMMUNITY)
Admission: EM | Admit: 2023-01-29 | Discharge: 2023-01-29 | Disposition: A | Discharge status: Skilled Nursing Facility | Payer: Medicare Other | Attending: Emergency Medicine | Admitting: Emergency Medicine

## 2023-01-29 ENCOUNTER — Encounter (HOSPITAL_COMMUNITY): Payer: Self-pay | Admitting: Emergency Medicine

## 2023-01-29 ENCOUNTER — Other Ambulatory Visit: Payer: Self-pay

## 2023-01-29 DIAGNOSIS — Z7982 Long term (current) use of aspirin: Secondary | ICD-10-CM | POA: Insufficient documentation

## 2023-01-29 DIAGNOSIS — F0390 Unspecified dementia without behavioral disturbance: Secondary | ICD-10-CM | POA: Diagnosis not present

## 2023-01-29 DIAGNOSIS — Z23 Encounter for immunization: Secondary | ICD-10-CM | POA: Insufficient documentation

## 2023-01-29 DIAGNOSIS — S0101XA Laceration without foreign body of scalp, initial encounter: Secondary | ICD-10-CM | POA: Insufficient documentation

## 2023-01-29 DIAGNOSIS — N189 Chronic kidney disease, unspecified: Secondary | ICD-10-CM | POA: Insufficient documentation

## 2023-01-29 DIAGNOSIS — S61412A Laceration without foreign body of left hand, initial encounter: Secondary | ICD-10-CM | POA: Insufficient documentation

## 2023-01-29 DIAGNOSIS — Z7901 Long term (current) use of anticoagulants: Secondary | ICD-10-CM | POA: Insufficient documentation

## 2023-01-29 DIAGNOSIS — I700 Atherosclerosis of aorta: Secondary | ICD-10-CM | POA: Insufficient documentation

## 2023-01-29 DIAGNOSIS — R0789 Other chest pain: Secondary | ICD-10-CM | POA: Diagnosis not present

## 2023-01-29 DIAGNOSIS — S61411A Laceration without foreign body of right hand, initial encounter: Secondary | ICD-10-CM | POA: Insufficient documentation

## 2023-01-29 DIAGNOSIS — S20219A Contusion of unspecified front wall of thorax, initial encounter: Secondary | ICD-10-CM | POA: Insufficient documentation

## 2023-01-29 LAB — CBC
HCT: 31.4 % — ABNORMAL LOW (ref 39.0–52.0)
Hemoglobin: 10.1 g/dL — ABNORMAL LOW (ref 13.0–17.0)
MCH: 29.1 pg (ref 26.0–34.0)
MCHC: 32.2 g/dL (ref 30.0–36.0)
MCV: 90.5 fL (ref 80.0–100.0)
Platelets: 195 10*3/uL (ref 150–400)
RBC: 3.47 MIL/uL — ABNORMAL LOW (ref 4.22–5.81)
RDW: 14.5 % (ref 11.5–15.5)
WBC: 7 10*3/uL (ref 4.0–10.5)
nRBC: 0 % (ref 0.0–0.2)

## 2023-01-29 LAB — I-STAT CHEM 8, ED
BUN: 22 mg/dL (ref 8–23)
Calcium, Ion: 0.99 mmol/L — ABNORMAL LOW (ref 1.15–1.40)
Chloride: 104 mmol/L (ref 98–111)
Creatinine, Ser: 1.1 mg/dL (ref 0.61–1.24)
Glucose, Bld: 116 mg/dL — ABNORMAL HIGH (ref 70–99)
HCT: 28 % — ABNORMAL LOW (ref 39.0–52.0)
Hemoglobin: 9.5 g/dL — ABNORMAL LOW (ref 13.0–17.0)
Potassium: 3.9 mmol/L (ref 3.5–5.1)
Sodium: 139 mmol/L (ref 135–145)
TCO2: 23 mmol/L (ref 22–32)

## 2023-01-29 LAB — COMPREHENSIVE METABOLIC PANEL
ALT: 16 U/L (ref 0–44)
AST: 21 U/L (ref 15–41)
Albumin: 2.7 g/dL — ABNORMAL LOW (ref 3.5–5.0)
Alkaline Phosphatase: 114 U/L (ref 38–126)
Anion gap: 12 (ref 5–15)
BUN: 22 mg/dL (ref 8–23)
CO2: 23 mmol/L (ref 22–32)
Calcium: 8.2 mg/dL — ABNORMAL LOW (ref 8.9–10.3)
Chloride: 103 mmol/L (ref 98–111)
Creatinine, Ser: 1.07 mg/dL (ref 0.61–1.24)
GFR, Estimated: >60 mL/min (ref >60)
Glucose, Bld: 117 mg/dL — ABNORMAL HIGH (ref 70–99)
Potassium: 3.9 mmol/L (ref 3.5–5.1)
Sodium: 138 mmol/L (ref 135–145)
Total Bilirubin: 0.7 mg/dL (ref 0.3–1.2)
Total Protein: 6.4 g/dL — ABNORMAL LOW (ref 6.5–8.1)

## 2023-01-29 LAB — APTT: aPTT: 38 seconds — ABNORMAL HIGH (ref 24–36)

## 2023-01-29 LAB — PROTIME-INR
INR: 1.2 (ref 0.8–1.2)
Prothrombin Time: 15.6 seconds — ABNORMAL HIGH (ref 11.4–15.2)

## 2023-01-29 MED ORDER — DOXYCYCLINE HYCLATE 100 MG PO CAPS: 100.0000 mg | ORAL_CAPSULE | Freq: Two times a day (BID) | ORAL | 0 refills | Status: DC

## 2023-01-29 MED ORDER — IOHEXOL 350 MG/ML SOLN
75.0000 mL | Freq: Once | INTRAVENOUS | Status: AC | PRN
  Administered 2023-01-29: 75 mL via INTRAVENOUS

## 2023-01-29 MED ORDER — TETANUS-DIPHTH-ACELL PERTUSSIS 5-2.5-18.5 LF-MCG/0.5 IM SUSY
0.5000 mL | PREFILLED_SYRINGE | Freq: Once | INTRAMUSCULAR | Status: AC
  Administered 2023-01-29: 0.5 mL via INTRAMUSCULAR
  Filled 2023-01-29: qty 0.5

## 2023-01-29 MED ORDER — CLINDAMYCIN PHOSPHATE 600 MG/50ML IV SOLN
600.0000 mg | Freq: Once | INTRAVENOUS | Status: AC
  Administered 2023-01-29: 600 mg via INTRAVENOUS
  Filled 2023-01-29: qty 50

## 2023-01-29 MED ORDER — LORAZEPAM 2 MG/ML IJ SOLN
1.0000 mg | Freq: Once | INTRAMUSCULAR | Status: AC
  Administered 2023-01-29: 1 mg via INTRAVENOUS
  Filled 2023-01-29: qty 1

## 2023-01-29 NOTE — ED Notes (Signed)
PT[facility] called for pt to [facility]-NO ETA

## 2023-01-29 NOTE — Progress Notes (Signed)
Orthopedic Tech Progress Note Patient Details:  Stanley Nielsen [birth year] [id number]  Patient ID: Stanley Nielsen, male   DOB: 1940/08/25, 82 y.o.   MRN: [id number] Level II; not currently needed. [name] 01/29/2023, 5:11 PM
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
## 2023-01-29 NOTE — Discharge Instructions (Addendum)
You have some lacerations and skin tears.  I applied Dermabond to the lacerations on your scalp.  You have a derma clip on your forehead.  That can be removed in a week.  Continue taking Tylenol as needed for pain.  If you have severe pain you will need to talk to your doctor at the facility about prescription for pain medicine  I have prescribed doxycycline to prevent infection.  Please take 100 mg twice daily for a week  Return to ER if you have worsening pain or uncontrolled bleeding

## 2023-02-26 ENCOUNTER — Other Ambulatory Visit: Payer: Self-pay

## 2023-02-26 ENCOUNTER — Emergency Department
Admission: EM | Admit: 2023-02-26 | Discharge: 2023-02-26 | Disposition: A | Discharge status: Home / Self Care | Payer: Medicare Other | Attending: Emergency Medicine | Admitting: Emergency Medicine

## 2023-02-26 DIAGNOSIS — R040 Epistaxis: Secondary | ICD-10-CM | POA: Insufficient documentation
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
## 2023-03-01 NOTE — Group Note (Deleted)

## 2023-04-26 ENCOUNTER — Emergency Department
Admission: EM | Admit: 2023-04-26 | Discharge: 2023-04-26 | Disposition: A | Discharge status: Home / Self Care | Payer: No Typology Code available for payment source | Attending: Emergency Medicine | Admitting: Emergency Medicine

## 2023-04-26 ENCOUNTER — Emergency Department: Payer: No Typology Code available for payment source

## 2023-04-26 ENCOUNTER — Other Ambulatory Visit: Payer: Self-pay

## 2023-04-26 DIAGNOSIS — S0101XA Laceration without foreign body of scalp, initial encounter: Secondary | ICD-10-CM | POA: Diagnosis not present

## 2023-04-26 DIAGNOSIS — F0390 Unspecified dementia without behavioral disturbance: Secondary | ICD-10-CM | POA: Diagnosis not present

## 2023-04-26 DIAGNOSIS — W19XXXA Unspecified fall, initial encounter: Secondary | ICD-10-CM | POA: Diagnosis not present

## 2023-04-26 DIAGNOSIS — S0990XA Unspecified injury of head, initial encounter: Secondary | ICD-10-CM | POA: Diagnosis present

## 2023-04-26 LAB — CBC WITH DIFFERENTIAL/PLATELET
Abs Immature Granulocytes: 0.01 10*3/uL (ref 0.00–0.07)
Basophils Absolute: 0 10*3/uL (ref 0.0–0.1)
Basophils Relative: 0 %
Eosinophils Absolute: 0.1 10*3/uL (ref 0.0–0.5)
Eosinophils Relative: 1 %
HCT: 34.4 % — ABNORMAL LOW (ref 39.0–52.0)
Hemoglobin: 11.1 g/dL — ABNORMAL LOW (ref 13.0–17.0)
Immature Granulocytes: 0 %
Lymphocytes Relative: 31 %
Lymphs Abs: 1.5 10*3/uL (ref 0.7–4.0)
MCH: 29.1 pg (ref 26.0–34.0)
MCHC: 32.3 g/dL (ref 30.0–36.0)
MCV: 90.3 fL (ref 80.0–100.0)
Monocytes Absolute: 0.4 10*3/uL (ref 0.1–1.0)
Monocytes Relative: 9 %
Neutro Abs: 2.8 10*3/uL (ref 1.7–7.7)
Neutrophils Relative %: 59 %
Platelets: 127 10*3/uL — ABNORMAL LOW (ref 150–400)
RBC: 3.81 MIL/uL — ABNORMAL LOW (ref 4.22–5.81)
RDW: 13.7 % (ref 11.5–15.5)
WBC: 4.9 10*3/uL (ref 4.0–10.5)
nRBC: 0 % (ref 0.0–0.2)

## 2023-04-26 LAB — BASIC METABOLIC PANEL
Anion gap: 7 (ref 5–15)
BUN: 22 mg/dL (ref 8–23)
CO2: 28 mmol/L (ref 22–32)
Calcium: 8.2 mg/dL — ABNORMAL LOW (ref 8.9–10.3)
Chloride: 102 mmol/L (ref 98–111)
Creatinine, Ser: 0.99 mg/dL (ref 0.61–1.24)
GFR, Estimated: >60 mL/min (ref >60)
Glucose, Bld: 139 mg/dL — ABNORMAL HIGH (ref 70–99)
Potassium: 3.5 mmol/L (ref 3.5–5.1)
Sodium: 137 mmol/L (ref 135–145)

## 2023-04-26 LAB — TROPONIN I (HIGH SENSITIVITY)
Troponin I (High Sensitivity): 56 ng/L — ABNORMAL HIGH (ref <18)
Troponin I (High Sensitivity): 64 ng/L — ABNORMAL HIGH (ref <18)

## 2023-04-26 NOTE — ED Provider Notes (Signed)
[facility] Provider Note    Event Date/Time   First MD Initiated Contact with Patient 04/26/23 1041     (approximate)   History   Fall   HPI  Stanley Nielsen is a 82 y.o. male with history of dementia who presents from living facility because of concerns for a fall.  Patient is unable to give any significant history.  Does state that he has a headache.     Physical Exam   Triage Vital Signs: ED Triage Vitals  Encounter Vitals Group     BP 04/26/23 1040 (!) 156/64     Systolic BP Percentile --      Diastolic BP Percentile --      Pulse Rate 04/26/23 1045 65     Resp 04/26/23 1045 11     Temp 04/26/23 1052 97.6 F (36.4 C)     Temp Source 04/26/23 1052 Oral     SpO2 04/26/23 1045 100 %     Weight --      Height --      Head Circumference --      Peak Flow --      Pain Score --      Pain Loc --      Pain Education --      Exclude from Growth Chart --     Most recent vital signs: Vitals:   04/26/23 1045 04/26/23 1052  BP:    Pulse: 65   Resp: 11   Temp:  97.6 F (36.4 C)  SpO2: 100%    General: Awake, alert, not oriented. CV:  Good peripheral perfusion. Regular rate and rhythm. Resp:  Normal effort. Lungs clear. Abd:  No distention.  Other:  Abrasion/laceration to left forehead.   ED Results / Procedures / Treatments   Labs (all labs ordered are listed, but only abnormal results are displayed) Labs Reviewed  CBC WITH DIFFERENTIAL/PLATELET - Abnormal; Notable for the following components:      Result Value   RBC 3.81 (*)    Hemoglobin 11.1 (*)    HCT 34.4 (*)    Platelets 127 (*)    All other components within normal limits  BASIC METABOLIC PANEL - Abnormal; Notable for the following components:   Glucose, Bld 139 (*)    Calcium 8.2 (*)    All other components within normal limits  TROPONIN I (HIGH SENSITIVITY) - Abnormal; Notable for the following components:   Troponin I (High Sensitivity) 56 (*)    All other  components within normal limits  TROPONIN I (HIGH SENSITIVITY) - Abnormal; Notable for the following components:   Troponin I (High Sensitivity) 64 (*)    All other components within normal limits     EKG  I, Phineas Semen, attending physician, personally viewed and interpreted this EKG  EKG Time: 1048 Rate: 64 Rhythm: sinus rhythm Axis: left axis deviation Intervals: qtc 361 QRS: narrow ST changes: no st elevation Impression: abnormal ekg   RADIOLOGY I independently interpreted and visualized the CT head/cervical spine. My interpretation: No intracranial bleed. No fracture Radiology interpretation:  IMPRESSION:  1. No acute intracranial pathology.  2. Midline frontal scalp swelling without underlying calvarial  fracture.  3. No acute fracture or traumatic malalignment of the cervical  spine.     PROCEDURES:  Critical Care performed: No  MEDICATIONS ORDERED IN ED: Medications - No data to display   IMPRESSION / MDM / ASSESSMENT AND PLAN / ED COURSE  I reviewed the  triage vital signs and the nursing notes.                              Differential diagnosis includes, but is not limited to, intracranial bleed, fracture, anemia, electrolyte abnormality  Patient's presentation is most consistent with acute presentation with potential threat to life or bodily function.   The patient is on the cardiac monitor to evaluate for evidence of arrhythmia and/or significant heart rate changes.  Patient presents to the emergency department today after a fall.  On exam he does have a small laceration to his left forehead that does not require any advanced closure.  CT scan of the head and cervical spine without any concerning abnormalities per my read.  Will check blood work.  CT head and spine negative per radiology.  Blood work without concerning anemia or electrolyte abnormality.  Did show slight elevation of troponin which did slightly increase on repeat.  Discussed with  patient's cousin, Sela Hilding, over the telephone.  Did discuss possibility that this signifies heart damage.  However patient did have an admission for an NSTEMI back in April and per discharge summary no advanced interventions were deemed appropriate given patient's age and health.  At this time cousin is comfortable with patient being discharged back to facility I think this is completely reasonable.      FINAL CLINICAL IMPRESSION(S) / ED DIAGNOSES   Final diagnoses:  Fall, initial encounter  Laceration of scalp, initial encounter     Note:  This document was prepared using Dragon voice recognition software and may include unintentional dictation errors.    Phineas Semen, MD 04/26/23 [phone]

## 2023-04-26 NOTE — ED Notes (Signed)
First cousin, Mellody Dance, at bedside and given update.

## 2023-04-26 NOTE — ED Triage Notes (Signed)
Pt from [facility] via [facility] for fall out of wheelchair face forward. Dementia patient, hx of being combative. Per staff pt is at verbal baseline. Pt had  a fall last night, abrasion to left arm which was wrapped by SNF staff.  EMS states pt was in bed upon arrival. Pt on Plavix. Pt has two large bruises to left and right chest. Out of facility DNR form brought with pt. EMS vitals: BP 171/60 HR 72

## 2023-04-26 NOTE — ED Notes (Signed)
Attempted call report. No answer.

## 2023-04-26 NOTE — ED Notes (Signed)
Assisted paramedic York Ram. With discharging pt because the chart would not let him despite documenting care handoff multiple times. Required to remove [facility] from triage start in order to discharge and set dispo to "home" despite pt going back to facility in order to discharge.

## 2023-04-26 NOTE — ED Notes (Addendum)
MD Jessup notified of patient's elevated BP. No new orders at this time.

## 2023-04-26 NOTE — ED Notes (Signed)
Pt able to follow commands. Disoriented x4 but is verbal.

## 2023-04-26 NOTE — ED Notes (Signed)
Report called to [facility]. Nurse notified that patient will be returning to facility shortly.

## 2023-04-26 NOTE — ED Notes (Signed)
Attempted call report to [facility].  No answer

## 2023-04-26 NOTE — Discharge Instructions (Signed)
Please seek medical attention for any high fevers, chest pain, shortness of breath, change in behavior, persistent vomiting, bloody stool or any other new or concerning symptoms.  

## 2023-04-26 NOTE — ED Notes (Signed)
This RN found pt with head against side rail and blood dripping from head. Gauze placed by RN was removed by pt. Pt rewrapped pt's head with Kerlix.

## 2023-07-12 IMAGING — CT CT HEAD W/O CM
4 series · 15 of 47 positions shown, 17 images · non-contrast
Comparison: Brain MRI 06/07/2021.  Head CT 10/01/2021.

CLINICAL DATA: 80-year-old male with dementia and witnessed fall.



[Series 2: head bone · axial · 0.43mm/px · z∈[-140,-126]mm · 2 of 74 slices shown]
[im 8/74  bone]
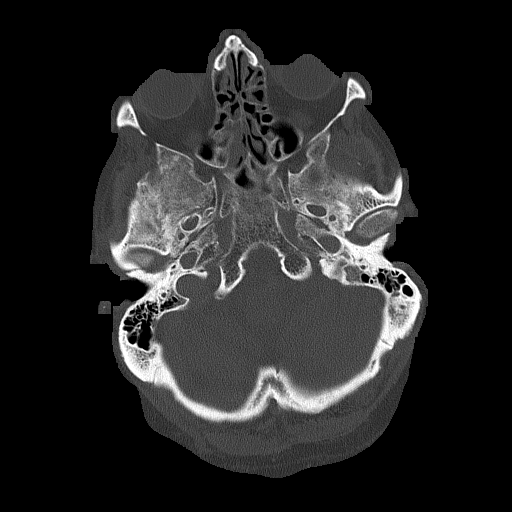
[im 15/74  bone]
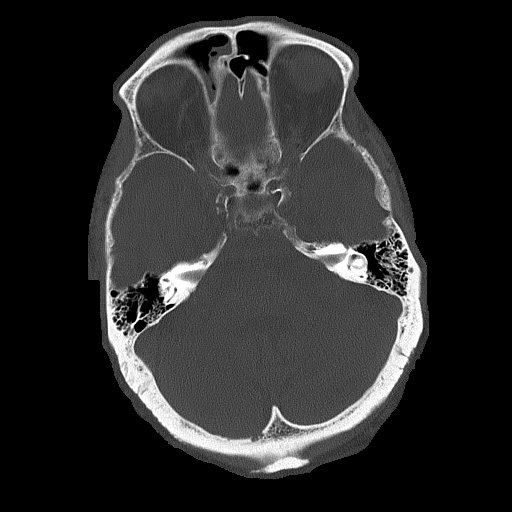

[Series 3: head wo · axial · 0.43mm/px · z∈[-139,-29]mm · 7 of 30 slices shown, 9 images]
[im 4/30  brain]
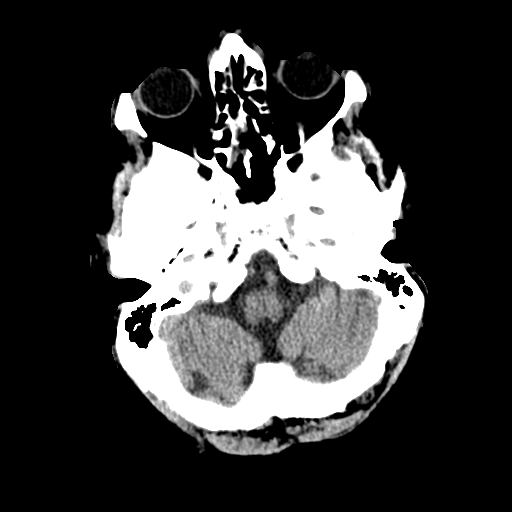
[im 4/30  bone]
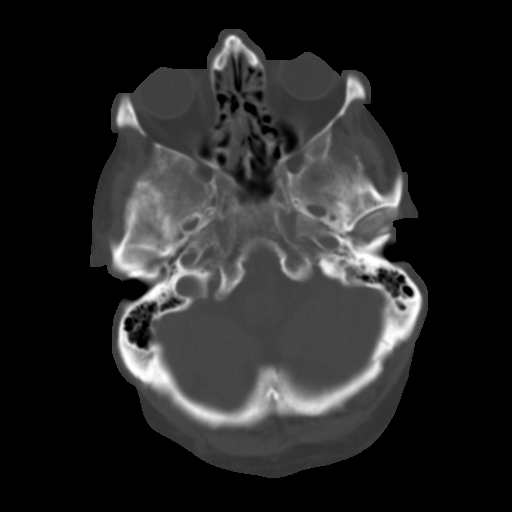
[im 8/30  brain]
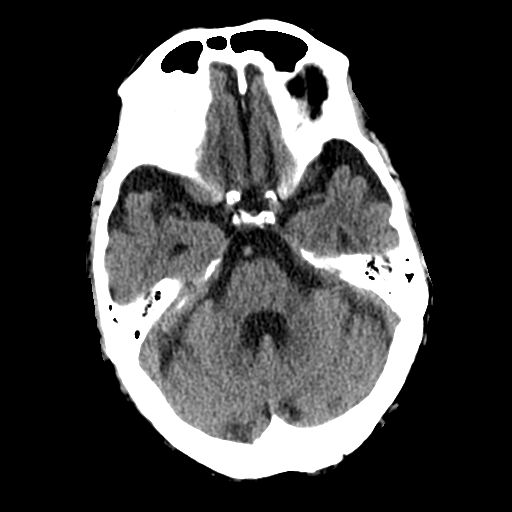
[im 11/30  brain]
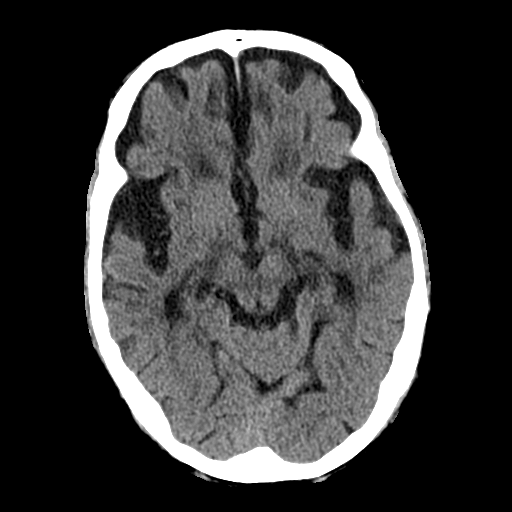
[im 15/30  brain]
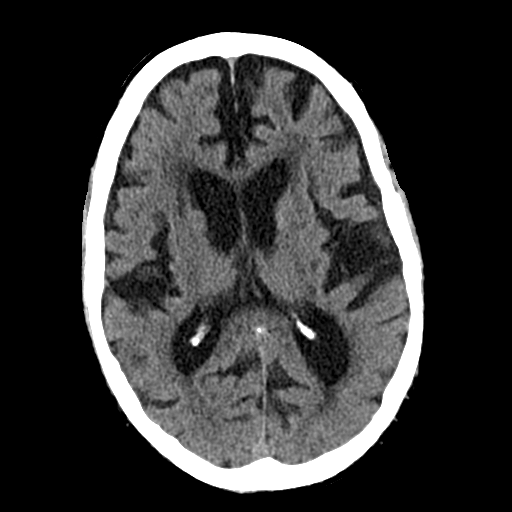
[im 19/30  brain]
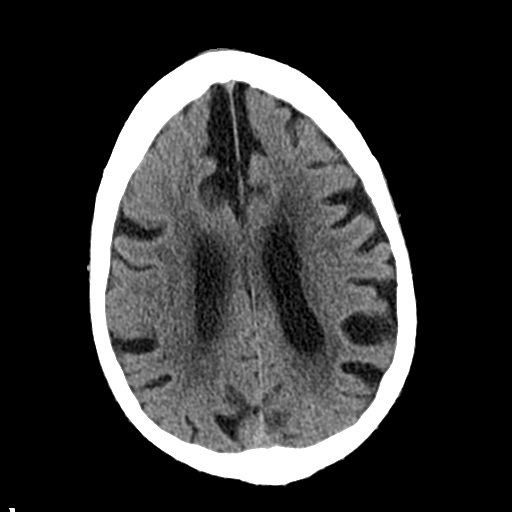
[im 19/30  bone]
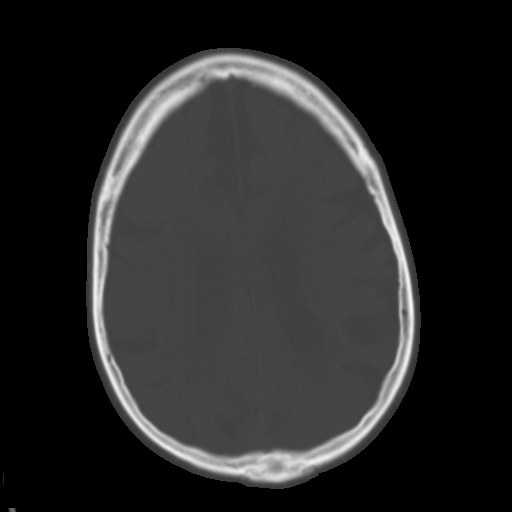
[im 22/30  brain]
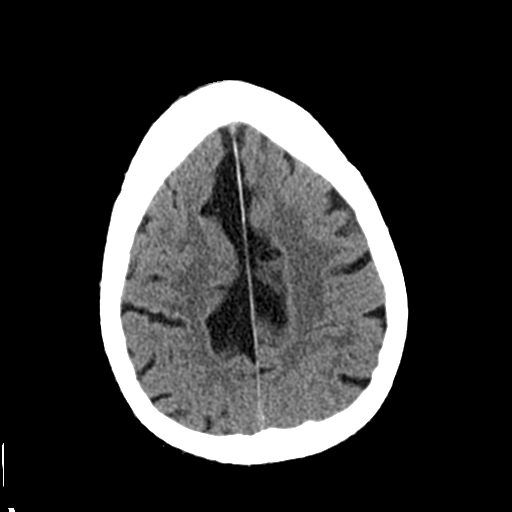
[im 26/30  brain]
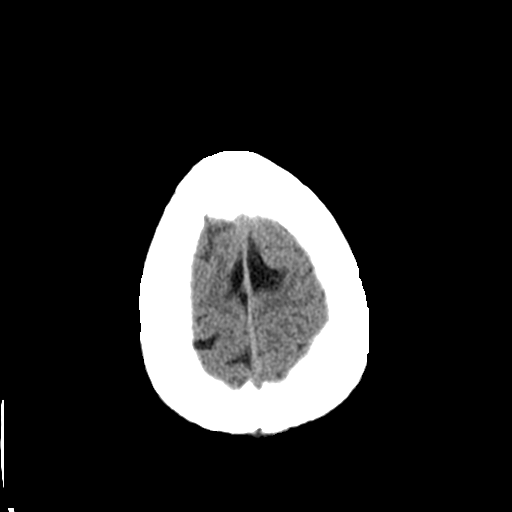

[Series 4: coronal soft tissue · coronal · 0.30mm/px · 3 of 71 slices shown]
[im 24/71  brain]
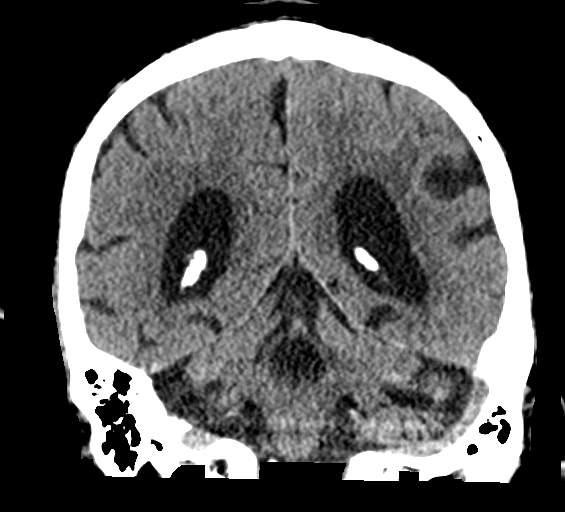
[im 32/71  brain]
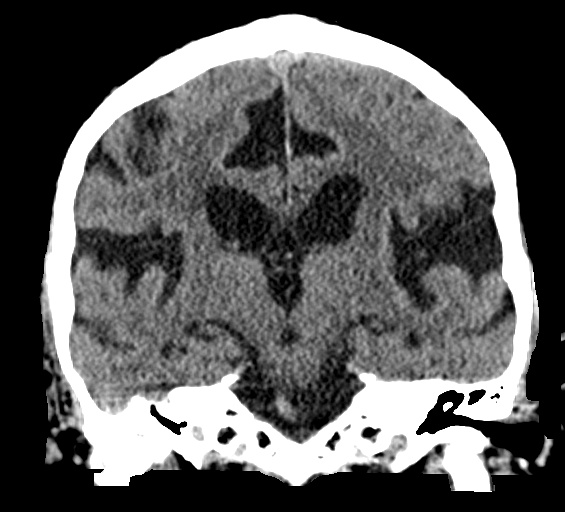
[im 39/71  brain]
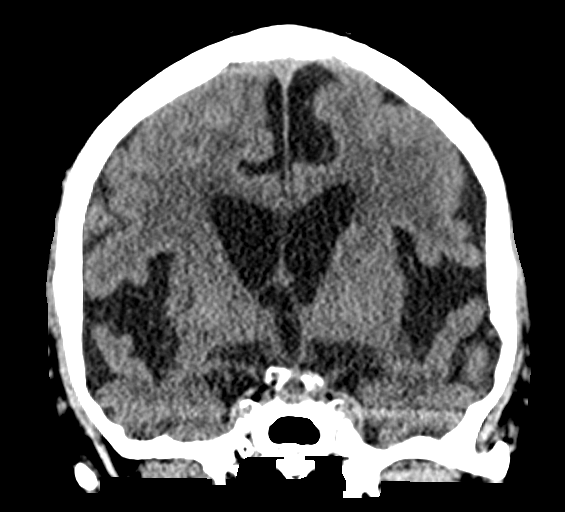

[Series 5: sagittal soft tissue · sagittal · 0.30mm/px · 3 of 58 slices shown]
[im 20/58  brain]
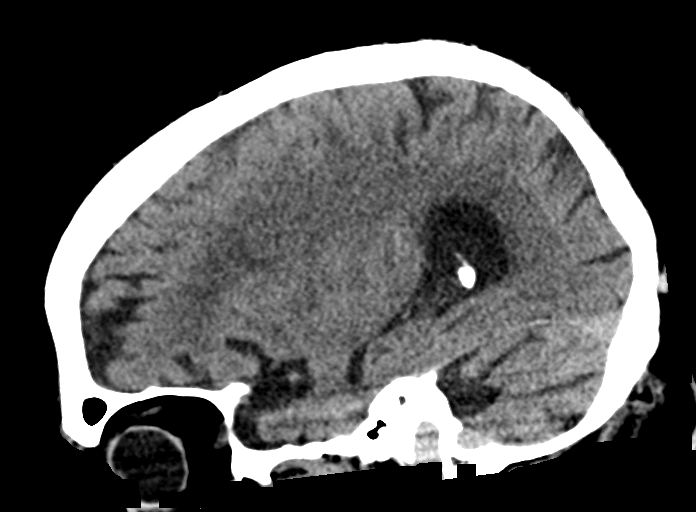
[im 29/58  brain]
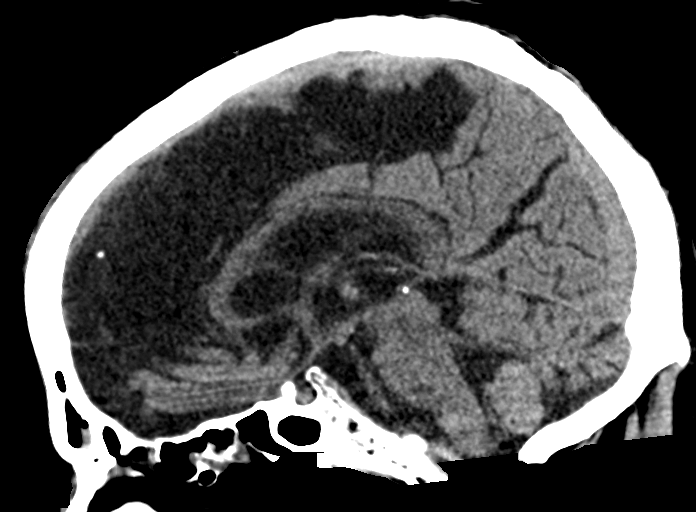
[im 39/58  brain]
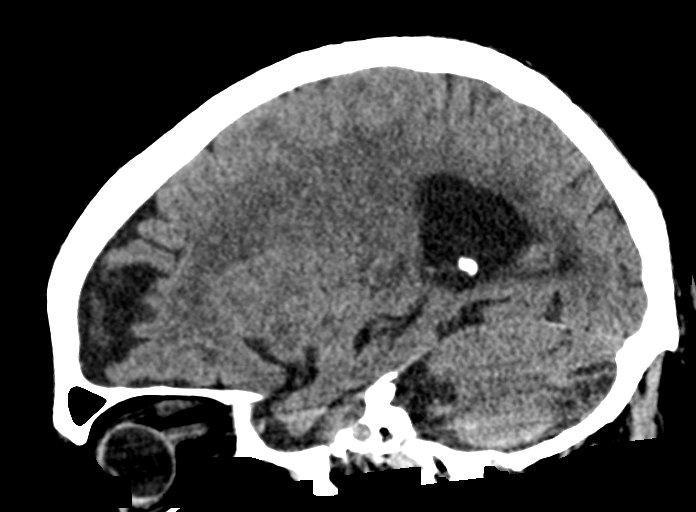

[15 of 47 positions shown; findings below may reference images not displayed]

FINDINGS: Brain: Stable cerebral volume. No midline shift, ventriculomegaly,
mass effect, evidence of mass lesion, intracranial hemorrhage or
evidence of cortically based acute infarction. Chronic small vessel
disease in the cerebral white matter, deep gray matter nuclei,
brainstem and cerebellum appears stable.

Vascular: Calcified atherosclerosis at the skull base. No suspicious
intracranial vascular hyperdensity.

Skull: Stable.  No acute osseous abnormality identified.

Sinuses/Orbits: Moderate paranasal sinus mucosal thickening with
bubbly opacity, mildly progressed from last month. Visible maxillary
sinuses remain spared. Tympanic cavities and mastoids remain clear.

Other: Anterior scalp hematoma or contusion on series 2, image 52.
Underlying frontal bones appear stable and intact. Visualized orbit
soft tissues are within normal limits.
IMPRESSION: 1. Anterior scalp hematoma without underlying skull fracture.
2. No acute intracranial abnormality. Stable non contrast CT
appearance of chronic small vessel disease.
3. Mild to moderate paranasal sinus inflammation appears increased
from last month.

## 2023-11-21 DEATH — deceased
